# Patient Record
Sex: Female | Born: 1940 | Race: White | Hispanic: No | Marital: Married | Smoking: Never smoker
Health system: Southern US, Community
[De-identification: ages and names within clinical notes are randomized; demographics above are authoritative.]

## PROBLEM LIST (undated history)

## (undated) DIAGNOSIS — I10 Essential (primary) hypertension: Secondary | ICD-10-CM

## (undated) DIAGNOSIS — D649 Anemia, unspecified: Secondary | ICD-10-CM

## (undated) DIAGNOSIS — Z87442 Personal history of urinary calculi: Secondary | ICD-10-CM

## (undated) DIAGNOSIS — E039 Hypothyroidism, unspecified: Secondary | ICD-10-CM

## (undated) DIAGNOSIS — M069 Rheumatoid arthritis, unspecified: Secondary | ICD-10-CM

## (undated) DIAGNOSIS — Z86010 Personal history of colonic polyps: Secondary | ICD-10-CM

## (undated) DIAGNOSIS — E079 Disorder of thyroid, unspecified: Secondary | ICD-10-CM

## (undated) DIAGNOSIS — M1990 Unspecified osteoarthritis, unspecified site: Secondary | ICD-10-CM

## (undated) DIAGNOSIS — Z8639 Personal history of other endocrine, nutritional and metabolic disease: Secondary | ICD-10-CM

## (undated) DIAGNOSIS — K449 Diaphragmatic hernia without obstruction or gangrene: Secondary | ICD-10-CM

## (undated) DIAGNOSIS — N201 Calculus of ureter: Secondary | ICD-10-CM

## (undated) DIAGNOSIS — E041 Nontoxic single thyroid nodule: Secondary | ICD-10-CM

## (undated) DIAGNOSIS — M5137 Other intervertebral disc degeneration, lumbosacral region: Secondary | ICD-10-CM

## (undated) DIAGNOSIS — M51379 Other intervertebral disc degeneration, lumbosacral region without mention of lumbar back pain or lower extremity pain: Secondary | ICD-10-CM

## (undated) DIAGNOSIS — E785 Hyperlipidemia, unspecified: Secondary | ICD-10-CM

## (undated) DIAGNOSIS — N393 Stress incontinence (female) (male): Secondary | ICD-10-CM

## (undated) DIAGNOSIS — K219 Gastro-esophageal reflux disease without esophagitis: Secondary | ICD-10-CM

## (undated) DIAGNOSIS — Z860101 Personal history of adenomatous and serrated colon polyps: Secondary | ICD-10-CM

## (undated) DIAGNOSIS — Z973 Presence of spectacles and contact lenses: Secondary | ICD-10-CM

## (undated) HISTORY — DX: Other intervertebral disc degeneration, lumbosacral region: M51.37

## (undated) HISTORY — DX: Disorder of thyroid, unspecified: E07.9

## (undated) HISTORY — PX: WISDOM TOOTH EXTRACTION: SHX21

## (undated) HISTORY — DX: Personal history of colonic polyps: Z86.010

## (undated) HISTORY — DX: Gastro-esophageal reflux disease without esophagitis: K21.9

## (undated) HISTORY — DX: Unspecified osteoarthritis, unspecified site: M19.90

## (undated) HISTORY — DX: Rheumatoid arthritis, unspecified: M06.9

## (undated) HISTORY — DX: Essential (primary) hypertension: I10

---

## 1981-12-23 HISTORY — PX: OOPHORECTOMY: SHX86

## 1999-04-04 ENCOUNTER — Encounter: Payer: Self-pay | Admitting: Emergency Medicine

## 1999-04-04 ENCOUNTER — Emergency Department (HOSPITAL_COMMUNITY): Admission: EM | Admit: 1999-04-04 | Discharge: 1999-04-05 | Payer: Self-pay | Admitting: Emergency Medicine

## 1999-08-23 ENCOUNTER — Other Ambulatory Visit: Admission: RE | Admit: 1999-08-23 | Discharge: 1999-08-23 | Payer: Self-pay | Admitting: Obstetrics and Gynecology

## 2000-08-22 ENCOUNTER — Other Ambulatory Visit: Admission: RE | Admit: 2000-08-22 | Discharge: 2000-08-22 | Payer: Self-pay | Admitting: Obstetrics and Gynecology

## 2001-08-31 ENCOUNTER — Other Ambulatory Visit: Admission: RE | Admit: 2001-08-31 | Discharge: 2001-08-31 | Payer: Self-pay | Admitting: Obstetrics and Gynecology

## 2002-02-24 ENCOUNTER — Ambulatory Visit (HOSPITAL_BASED_OUTPATIENT_CLINIC_OR_DEPARTMENT_OTHER): Admission: RE | Admit: 2002-02-24 | Discharge: 2002-02-24 | Payer: Self-pay | Admitting: Plastic Surgery

## 2002-09-08 ENCOUNTER — Other Ambulatory Visit: Admission: RE | Admit: 2002-09-08 | Discharge: 2002-09-08 | Payer: Self-pay | Admitting: Obstetrics and Gynecology

## 2004-12-23 DIAGNOSIS — M069 Rheumatoid arthritis, unspecified: Secondary | ICD-10-CM | POA: Insufficient documentation

## 2005-10-03 ENCOUNTER — Ambulatory Visit (HOSPITAL_COMMUNITY): Admission: RE | Admit: 2005-10-03 | Discharge: 2005-10-03 | Payer: Self-pay | Admitting: Internal Medicine

## 2005-10-09 ENCOUNTER — Other Ambulatory Visit: Admission: RE | Admit: 2005-10-09 | Discharge: 2005-10-09 | Payer: Self-pay | Admitting: Interventional Radiology

## 2005-10-09 ENCOUNTER — Encounter (INDEPENDENT_AMBULATORY_CARE_PROVIDER_SITE_OTHER): Payer: Self-pay | Admitting: *Deleted

## 2005-10-09 ENCOUNTER — Encounter: Admission: RE | Admit: 2005-10-09 | Discharge: 2005-10-09 | Payer: Self-pay | Admitting: Internal Medicine

## 2006-09-29 ENCOUNTER — Ambulatory Visit: Payer: Self-pay | Admitting: Gastroenterology

## 2006-10-14 ENCOUNTER — Ambulatory Visit: Payer: Self-pay | Admitting: Gastroenterology

## 2006-10-14 ENCOUNTER — Encounter (INDEPENDENT_AMBULATORY_CARE_PROVIDER_SITE_OTHER): Payer: Self-pay | Admitting: *Deleted

## 2006-10-16 ENCOUNTER — Ambulatory Visit: Payer: Self-pay | Admitting: Gastroenterology

## 2006-10-27 ENCOUNTER — Ambulatory Visit: Payer: Self-pay | Admitting: Gastroenterology

## 2006-10-27 ENCOUNTER — Encounter (INDEPENDENT_AMBULATORY_CARE_PROVIDER_SITE_OTHER): Payer: Self-pay | Admitting: *Deleted

## 2007-12-24 HISTORY — PX: WRIST SURGERY: SHX841

## 2008-10-06 ENCOUNTER — Ambulatory Visit (HOSPITAL_BASED_OUTPATIENT_CLINIC_OR_DEPARTMENT_OTHER): Admission: RE | Admit: 2008-10-06 | Discharge: 2008-10-06 | Payer: Self-pay | Admitting: Orthopedic Surgery

## 2008-10-06 ENCOUNTER — Encounter (INDEPENDENT_AMBULATORY_CARE_PROVIDER_SITE_OTHER): Payer: Self-pay | Admitting: Orthopedic Surgery

## 2008-10-06 HISTORY — PX: DORSAL COMPARTMENT RELEASE: SHX1474

## 2009-03-30 ENCOUNTER — Emergency Department (HOSPITAL_COMMUNITY): Admission: EM | Admit: 2009-03-30 | Discharge: 2009-03-30 | Payer: Self-pay | Admitting: Emergency Medicine

## 2010-09-20 ENCOUNTER — Encounter: Admission: RE | Admit: 2010-09-20 | Discharge: 2010-09-20 | Payer: Self-pay | Admitting: Internal Medicine

## 2011-05-07 NOTE — Op Note (Signed)
[name], [name]                 ACCOUNT NO.:  [id number]   MEDICAL RECORD NO.:  [id number]          PATIENT TYPE:  AMB   LOCATION:  DSC                          FACILITY:  [facility]   PHYSICIAN:  Cindee Salt, M.D.       DATE OF BIRTH:  04/19/1941   DATE OF PROCEDURE:  10/06/2008  DATE OF DISCHARGE:                               OPERATIVE REPORT   PREOPERATIVE DIAGNOSIS:  [name], first dorsal compartment, right  wrist.   POSTOPERATIVE DIAGNOSIS:  [name], first dorsal compartment, right  wrist.   OPERATION:  Release of first dorsal compartment with tenosynovectomy,  right wrist.   SURGEON:  Cindee Salt, MD   ANESTHESIA:  Forearm-based IV regional.   ANESTHESIOLOGIST:  Zenon Mayo, MD.   HISTORY:  The patient is a 70 year old female with a history of  tenosynovitis of the first dorsal compartment and has de Quervain's with  a large cyst over the first dorsal compartment.  She is desirous of  release and having this cyst excised.  She is aware of risks and  complications including infection, recurrence injury to arteries,  nerves, tendons, incomplete relief of symptoms, dystrophy, problems with  radial nerve, and subluxation of the tendons.  She has elected to  undergo surgical decompression.  In the preoperative area, the patient  is seen.  The extremity was marked by both the patient and surgeon.  Antibiotic was given.   PROCEDURE:  The patient was brought to the operating room where a  forearm-based IV regional anesthetic was carried out without difficulty.  She was prepped using DuraPrep, supine position, right arm-free.  A time-  out was taken.  A longitudinal incision was made over the first dorsal  compartment.  The cyst which had been present was felt to have broken  after she hit it with a broom handle.  The dissection was carried down  through the subcutaneous tissue.  The radial nerve was identified and  protected.  The first dorsal compartment  was found be extremely  thickened.  This was incised on its most dorsal aspect releasing the  abductor pollicis longus and extensor brevis.  A very significant  tenosynovitis was present with adherence of all the tendons into one  large tendon group.  A tenosynovectomy was performed identifying the EPB  which was adherent to the APL.  This was released.  The tenosynovial  tissue was sent to pathology.  No further lesions were identified.  The  wound was irrigated.  The skin was then closed with interrupted 5-0  Vicryl Rapide sutures.  Sterile compressive dressing and thumb spica  splint  was applied.  The patient tolerated the procedure well and was taken to  the recovery room for observation in satisfactory condition.   She will be discharged home to return to the [facility]  in 1 week on Vicodin.           ______________________________  Cindee Salt, M.D.     GK/MEDQ  D:  10/06/2008  T:  10/06/2008  Job:  [id number]   cc:  Syed T. Lolly Mustache, M.D.

## 2011-10-31 ENCOUNTER — Encounter: Payer: Self-pay | Admitting: Gastroenterology

## 2011-11-04 ENCOUNTER — Encounter: Payer: Self-pay | Admitting: Gastroenterology

## 2011-12-12 ENCOUNTER — Encounter: Payer: Self-pay | Admitting: Gastroenterology

## 2011-12-27 ENCOUNTER — Encounter: Payer: Self-pay | Admitting: Gastroenterology

## 2011-12-27 ENCOUNTER — Ambulatory Visit (AMBULATORY_SURGERY_CENTER): Payer: Medicare Other | Admitting: *Deleted

## 2011-12-27 VITALS — Ht 65.0 in | Wt 198.5 lb

## 2011-12-27 DIAGNOSIS — Z1211 Encounter for screening for malignant neoplasm of colon: Secondary | ICD-10-CM

## 2011-12-27 DIAGNOSIS — Z86010 Personal history of colonic polyps: Secondary | ICD-10-CM

## 2011-12-27 MED ORDER — PEG-KCL-NACL-NASULF-NA ASC-C 100 G PO SOLR: ORAL | Status: DC

## 2012-01-10 ENCOUNTER — Encounter: Payer: Medicare Other | Admitting: Gastroenterology

## 2012-01-10 ENCOUNTER — Ambulatory Visit (AMBULATORY_SURGERY_CENTER): Payer: Medicare Other | Admitting: Gastroenterology

## 2012-01-10 ENCOUNTER — Encounter: Payer: Self-pay | Admitting: Gastroenterology

## 2012-01-10 DIAGNOSIS — Z1211 Encounter for screening for malignant neoplasm of colon: Secondary | ICD-10-CM

## 2012-01-10 DIAGNOSIS — D126 Benign neoplasm of colon, unspecified: Secondary | ICD-10-CM

## 2012-01-10 DIAGNOSIS — Z86010 Personal history of colonic polyps: Secondary | ICD-10-CM

## 2012-01-10 MED ORDER — SODIUM CHLORIDE 0.9 % IV SOLN: 500.0000 mL | INTRAVENOUS | Status: DC

## 2012-01-10 NOTE — Op Note (Signed)
[facility] [address] [place], [place]  [address]  COLONOSCOPY PROCEDURE REPORT  PATIENT:  Allison, Whitaker  MR#:  [id number] BIRTHDATE:  1941-09-04, 70 yrs. old  GENDER:  female ENDOSCOPIST:  Judie Petit T. Russella Dar, MD, [facility]  PROCEDURE DATE:  01/10/2012 PROCEDURE:  Colonoscopy with snare polypectomy ASA CLASS:  Class II INDICATIONS:  1) surveillance and high-risk screening  2) history of pre-cancerous (adenomatous) colon polyps: 09/2006. MEDICATIONS:   MAC sedation, administered by CRNA, propofol (Diprivan) 320 mg IV DESCRIPTION OF PROCEDURE:   After the risks benefits and alternatives of the procedure were thoroughly explained, informed consent was obtained.  Digital rectal exam was performed and revealed no abnormalities.   The LB PCF-H180AL [id number] endoscope was introduced through the anus and advanced to the cecum, which was identified by both the appendix and ileocecal valve, without limitations.  The quality of the prep was excellent, using MoviPrep.  The instrument was then slowly withdrawn as the colon was fully examined. <<PROCEDUREIMAGES>> FINDINGS:  A sessile polyp was found in the mid transverse colon. It was 5 mm in size. Polyp was snared without cautery. Retrieval was successful. Otherwise normal colonoscopy without other polyps, masses, vascular ectasias, or inflammatory changes. Retroflexed views in the rectum revealed no abnormalities. The time to cecum = 2  minutes. The scope was then withdrawn (time =  9.75  min) from the patient and the procedure completed.  COMPLICATIONS:  None  ENDOSCOPIC IMPRESSION: 1) 5 mm sessile polyp in the mid transverse colon  RECOMMENDATIONS: 1) Await pathology results 2) Repeat Colonoscopy in 5 years.  Venita Lick. Russella Dar, MD, [facility]  n. eSIGNED:   [name] at 01/10/2012 10:40 AM  [place], [id number]

## 2012-01-10 NOTE — Patient Instructions (Signed)
Please refer to your blue and neon green sheets for instructions regarding diet and activity for the rest of today.  You may resume your medications as you would normally take them.   You will receive a letter in the mail in about two weeks regarding the results of the biopsies.   Colon Polyps A polyp is extra tissue that grows inside your body. Colon polyps grow in the large intestine. The large intestine, also called the colon, is part of your digestive system. It is a long, hollow tube at the end of your digestive tract where your body makes and stores stool. Most polyps are not dangerous. They are benign. This means they are not cancerous. But over time, some types of polyps can turn into cancer. Polyps that are smaller than a pea are usually not harmful. But larger polyps could someday become or may already be cancerous. To be safe, doctors remove all polyps and test them.  WHO GETS POLYPS? Anyone can get polyps, but certain people are more likely than others. You may have a greater chance of getting polyps if:  You are over 50.   You have had polyps before.   Someone in your family has had polyps.   Someone in your family has had cancer of the large intestine.   Find out if someone in your family has had polyps. You may also be more likely to get polyps if you:   Eat a lot of fatty foods.   Smoke.   Drink alcohol.   Do not exercise.   Eat too much.  SYMPTOMS  Most small polyps do not cause symptoms. People often do not know they have one until their caregiver finds it during a regular checkup or while testing them for something else. Some people do have symptoms like these:  Bleeding from the anus. You might notice blood on your underwear or on toilet paper after you have had a bowel movement.   Constipation or diarrhea that lasts more than a week.   Blood in the stool. Blood can make stool look black or it can show up as red streaks in the stool.  If you have any of  these symptoms, see your caregiver. HOW DOES THE DOCTOR TEST FOR POLYPS? The doctor can use four tests to check for polyps:  Digital rectal exam. The caregiver wears gloves and checks your rectum (the last part of the large intestine) to see if it feels normal. This test would find polyps only in the rectum. Your caregiver may need to do one of the other tests listed below to find polyps higher up in the intestine.   Barium enema. The caregiver puts a liquid called barium into your rectum before taking x-rays of your large intestine. Barium makes your intestine look white in the pictures. Polyps are dark, so they are easy to see.   Sigmoidoscopy. With this test, the caregiver can see inside your large intestine. A thin flexible tube is placed into your rectum. The device is called a sigmoidoscope, which has a light and a tiny video camera in it. The caregiver uses the sigmoidoscope to look at the last third of your large intestine.   Colonoscopy. This test is like sigmoidoscopy, but the caregiver looks at all of the large intestine. It usually requires sedation. This is the most common method for finding and removing polyps.  TREATMENT   The caregiver will remove the polyp during sigmoidoscopy or colonoscopy. The polyp is then tested for  cancer.   If you have had polyps, your caregiver may want you to get tested regularly in the future.  PREVENTION  There is not one sure way to prevent polyps. You might be able to lower your risk of getting them if you:  Eat more fruits and vegetables and less fatty food.   Do not smoke.   Avoid alcohol.   Exercise every day.   Lose weight if you are overweight.   Eating more calcium and folate can also lower your risk of getting polyps. Some foods that are rich in calcium are milk, cheese, and broccoli. Some foods that are rich in folate are chickpeas, kidney beans, and spinach.   Aspirin might help prevent polyps. Studies are under way.  Document  Released: 09/04/2004 Document Revised: 08/21/2011 Document Reviewed: 02/10/2008 [facility] Patient Information 2012 [place], [place].

## 2012-01-10 NOTE — Progress Notes (Signed)
Patient did not experience any of the following events: a burn prior to discharge; a fall within the facility; wrong site/side/patient/procedure/implant event; or a hospital transfer or hospital admission upon discharge from the facility. (G8907) Patient did not have preoperative order for IV antibiotic SSI prophylaxis. (G8918)  

## 2012-01-13 ENCOUNTER — Telehealth: Payer: Self-pay | Admitting: *Deleted

## 2012-01-13 NOTE — Telephone Encounter (Signed)

## 2012-01-14 ENCOUNTER — Encounter: Payer: Self-pay | Admitting: Gastroenterology

## 2012-11-02 ENCOUNTER — Other Ambulatory Visit: Payer: Self-pay | Admitting: Internal Medicine

## 2012-11-02 DIAGNOSIS — E041 Nontoxic single thyroid nodule: Secondary | ICD-10-CM

## 2012-11-06 ENCOUNTER — Ambulatory Visit
Admission: RE | Admit: 2012-11-06 | Discharge: 2012-11-06 | Disposition: A | Discharge status: Home / Self Care | Payer: Medicare Other | Source: Ambulatory Visit | Attending: Internal Medicine | Admitting: Internal Medicine

## 2012-11-06 DIAGNOSIS — E041 Nontoxic single thyroid nodule: Secondary | ICD-10-CM

## 2012-11-16 ENCOUNTER — Other Ambulatory Visit: Payer: Self-pay | Admitting: Internal Medicine

## 2012-11-16 DIAGNOSIS — E041 Nontoxic single thyroid nodule: Secondary | ICD-10-CM

## 2012-11-26 ENCOUNTER — Other Ambulatory Visit (HOSPITAL_COMMUNITY)
Admission: RE | Admit: 2012-11-26 | Discharge: 2012-11-26 | Disposition: A | Discharge status: Home / Self Care | Payer: Medicare Other | Source: Ambulatory Visit | Attending: Interventional Radiology | Admitting: Interventional Radiology

## 2012-11-26 ENCOUNTER — Ambulatory Visit
Admission: RE | Admit: 2012-11-26 | Discharge: 2012-11-26 | Disposition: A | Discharge status: Home / Self Care | Payer: Medicare Other | Source: Ambulatory Visit | Attending: Internal Medicine | Admitting: Internal Medicine

## 2012-11-26 DIAGNOSIS — E041 Nontoxic single thyroid nodule: Secondary | ICD-10-CM

## 2012-11-26 DIAGNOSIS — E049 Nontoxic goiter, unspecified: Secondary | ICD-10-CM | POA: Insufficient documentation

## 2013-10-04 ENCOUNTER — Other Ambulatory Visit: Payer: Self-pay | Admitting: Dermatology

## 2013-10-28 ENCOUNTER — Other Ambulatory Visit: Payer: Self-pay

## 2014-10-03 ENCOUNTER — Other Ambulatory Visit: Payer: Self-pay | Admitting: Dermatology

## 2015-07-04 ENCOUNTER — Encounter: Payer: Self-pay | Admitting: Gastroenterology

## 2016-08-29 ENCOUNTER — Encounter (HOSPITAL_COMMUNITY): Payer: Self-pay | Admitting: *Deleted

## 2016-08-29 ENCOUNTER — Emergency Department (HOSPITAL_COMMUNITY)
Admission: EM | Admit: 2016-08-29 | Discharge: 2016-08-29 | Disposition: A | Discharge status: Home / Self Care | Payer: Medicare Other | Attending: Emergency Medicine | Admitting: Emergency Medicine

## 2016-08-29 DIAGNOSIS — R109 Unspecified abdominal pain: Secondary | ICD-10-CM | POA: Insufficient documentation

## 2016-08-29 DIAGNOSIS — I10 Essential (primary) hypertension: Secondary | ICD-10-CM | POA: Insufficient documentation

## 2016-08-29 DIAGNOSIS — Z79899 Other long term (current) drug therapy: Secondary | ICD-10-CM | POA: Diagnosis not present

## 2016-08-29 DIAGNOSIS — Z791 Long term (current) use of non-steroidal anti-inflammatories (NSAID): Secondary | ICD-10-CM | POA: Diagnosis not present

## 2016-08-29 DIAGNOSIS — R110 Nausea: Secondary | ICD-10-CM | POA: Insufficient documentation

## 2016-08-29 LAB — BASIC METABOLIC PANEL
Anion gap: 7 (ref 5–15)
BUN: 19 mg/dL (ref 6–20)
CO2: 23 mmol/L (ref 22–32)
Calcium: 9.7 mg/dL (ref 8.9–10.3)
Chloride: 107 mmol/L (ref 101–111)
Creatinine, Ser: 0.8 mg/dL (ref 0.44–1.00)
GFR calc Af Amer: >60 mL/min (ref >60)
GFR calc non Af Amer: >60 mL/min (ref >60)
Glucose, Bld: 132 mg/dL — ABNORMAL HIGH (ref 65–99)
Potassium: 4.1 mmol/L (ref 3.5–5.1)
Sodium: 137 mmol/L (ref 135–145)

## 2016-08-29 LAB — CBC WITH DIFFERENTIAL/PLATELET
Basophils Absolute: 0 10*3/uL (ref 0.0–0.1)
Basophils Relative: 0 %
Eosinophils Absolute: 0 10*3/uL (ref 0.0–0.7)
Eosinophils Relative: 0 %
HCT: 39.7 % (ref 36.0–46.0)
Hemoglobin: 13.5 g/dL (ref 12.0–15.0)
Lymphocytes Relative: 5 %
Lymphs Abs: 0.5 10*3/uL — ABNORMAL LOW (ref 0.7–4.0)
MCH: 31.5 pg (ref 26.0–34.0)
MCHC: 34 g/dL (ref 30.0–36.0)
MCV: 92.5 fL (ref 78.0–100.0)
Monocytes Absolute: 0.2 10*3/uL (ref 0.1–1.0)
Monocytes Relative: 3 %
Neutro Abs: 8.1 10*3/uL — ABNORMAL HIGH (ref 1.7–7.7)
Neutrophils Relative %: 92 %
Platelets: 221 10*3/uL (ref 150–400)
RBC: 4.29 MIL/uL (ref 3.87–5.11)
RDW: 13.9 % (ref 11.5–15.5)
WBC: 8.8 10*3/uL (ref 4.0–10.5)

## 2016-08-29 LAB — URINALYSIS, ROUTINE W REFLEX MICROSCOPIC
Bilirubin Urine: NEGATIVE
Glucose, UA: NEGATIVE mg/dL
Ketones, ur: NEGATIVE mg/dL
Nitrite: NEGATIVE
Protein, ur: NEGATIVE mg/dL
Specific Gravity, Urine: 1.024 (ref 1.005–1.030)
pH: 6 (ref 5.0–8.0)

## 2016-08-29 LAB — URINE MICROSCOPIC-ADD ON

## 2016-08-29 MED ORDER — ONDANSETRON HCL 4 MG PO TABS: 4.0000 mg | ORAL_TABLET | Freq: Four times a day (QID) | ORAL | 0 refills | Status: DC

## 2016-08-29 MED ORDER — OXYCODONE-ACETAMINOPHEN 5-325 MG PO TABS
2.0000 | ORAL_TABLET | Freq: Once | ORAL | Status: AC
  Administered 2016-08-29: 2 via ORAL
  Filled 2016-08-29: qty 2

## 2016-08-29 MED ORDER — ONDANSETRON 4 MG PO TBDP
4.0000 mg | ORAL_TABLET | Freq: Once | ORAL | Status: AC
  Administered 2016-08-29: 4 mg via ORAL
  Filled 2016-08-29: qty 1

## 2016-08-29 MED ORDER — IBUPROFEN 200 MG PO TABS
600.0000 mg | ORAL_TABLET | Freq: Once | ORAL | Status: AC
  Administered 2016-08-29: 600 mg via ORAL
  Filled 2016-08-29: qty 3

## 2016-08-29 MED ORDER — OXYCODONE-ACETAMINOPHEN 5-325 MG PO TABS: 1.0000 | ORAL_TABLET | ORAL | 0 refills | Status: DC | PRN

## 2016-08-29 NOTE — ED Provider Notes (Signed)
[facility] DEPT Provider Note   CSN: [id number] Arrival date & time: 08/29/16  0818  By signing my name below, I, Sonum Patel, attest that this documentation has been prepared under the direction and in the presence of Virgel Manifold, MD. Electronically Signed: Sonum Patel, Education administrator. 08/29/16. 9:32 AM.  History   Chief Complaint Chief Complaint  Patient presents with  . Flank Pain    The history is provided by the patient. No language interpreter was used.     HPI Comments: Allison Whitaker is a 75 y.o. female who presents to the Emergency Department complaining of intermittent right flank pain with associated nausea that began 7.5 hours ago. She reports a history of similar symptoms with prior kidney stones. She has not taken any OTC medication for her pain. She denies history of requiring lithotripsy. She denies dysuria, hematuria, fever, chills.    Past Medical History:  Diagnosis Date  . GERD (gastroesophageal reflux disease)   . Hypertension   . Rheumatoid arthritis(714.0)   . Thyroid disease     There are no active problems to display for this patient.   Past Surgical History:  Procedure Laterality Date  . OOPHORECTOMY  1983  . WRIST SURGERY  2009   for tendonitis    OB History    No data available       Home Medications    Prior to Admission medications   Medication Sig Start Date End Date Taking? Authorizing Provider  cholecalciferol (VITAMIN D) 1000 units tablet Take 2,000 Units by mouth daily.   Yes Historical Provider, MD  folic acid (FOLVITE) 1 MG tablet Take 3 mg by mouth daily.    Yes Historical Provider, MD  levothyroxine (SYNTHROID, LEVOTHROID) 75 MCG tablet Take 75 mcg by mouth daily.     Yes Historical Provider, MD  losartan-hydrochlorothiazide (HYZAAR) 50-12.5 MG per tablet Take 1 tablet by mouth daily.     Yes Historical Provider, MD  meloxicam (MOBIC) 7.5 MG tablet Take 7.5 mg by mouth daily as needed for pain.  07/30/16  Yes Historical Provider,  MD  methotrexate (RHEUMATREX) 2.5 MG tablet Take 20 mg by mouth every Tuesday. Caution:Chemotherapy. Protect from light. Takes 8  tabs once per week.   Yes Historical Provider, MD  Multiple Vitamin (MULTIVITAMIN) tablet Take 1 tablet by mouth daily.     Yes Historical Provider, MD  omeprazole (PRILOSEC) 20 MG capsule Take 20 mg by mouth daily.     Yes Historical Provider, MD  oxybutynin (DITROPAN-XL) 10 MG 24 hr tablet Take 10 mg by mouth every other day. 07/23/16  Yes Historical Provider, MD    Family History Family History  Problem Relation Age of Onset  . Stroke Mother   . Colon cancer Neg Hx     Social History Social History  Substance Use Topics  . Smoking status: Never Smoker  . Smokeless tobacco: Never Used  . Alcohol use 0.6 oz/week    1 Glasses of wine per week     Allergies   Review of patient's allergies indicates no known allergies.   Review of Systems Review of Systems  Constitutional: Negative for chills and fever.  Gastrointestinal: Positive for nausea.  Genitourinary: Positive for flank pain. Negative for dysuria and hematuria.  All other systems reviewed and are negative.    Physical Exam Updated Vital Signs BP 146/65 (BP Location: Right Arm)   Pulse (!) 58   Temp 98.3 F (36.8 C) (Oral)   Resp 18   Ht 5'  5" (1.651 m)   Wt 190 lb (86.2 kg)   SpO2 98%   BMI 31.62 kg/m   Physical Exam  Constitutional: She is oriented to person, place, and time. She appears well-developed and well-nourished. No distress.  HENT:  Head: Normocephalic and atraumatic.  Eyes: EOM are normal.  Neck: Normal range of motion.  Cardiovascular: Normal rate, regular rhythm and normal heart sounds.   Pulmonary/Chest: Effort normal and breath sounds normal.  Abdominal: Soft. She exhibits no distension. There is no tenderness.  Musculoskeletal: Normal range of motion. She exhibits tenderness.  Right CVA tenderness.   Neurological: She is alert and oriented to person, place,  and time.  Skin: Skin is warm and dry.  Psychiatric: She has a normal mood and affect. Judgment normal.  Nursing note and vitals reviewed.    ED Treatments / Results  DIAGNOSTIC STUDIES: Oxygen Saturation is 98% on RA, normal by my interpretation.    COORDINATION OF CARE: 9:32 AM Discussed treatment plan with pt at bedside and pt agreed to plan.    Labs (all labs ordered are listed, but only abnormal results are displayed) Labs Reviewed  URINALYSIS, ROUTINE W REFLEX MICROSCOPIC (NOT AT [place]) - Abnormal; Notable for the following:       Result Value   APPearance CLOUDY (*)    Hgb urine dipstick LARGE (*)    Leukocytes, UA SMALL (*)    All other components within normal limits  URINE MICROSCOPIC-ADD ON - Abnormal; Notable for the following:    Squamous Epithelial / LPF 0-5 (*)    Bacteria, UA FEW (*)    All other components within normal limits  BASIC METABOLIC PANEL - Abnormal; Notable for the following:    Glucose, Bld 132 (*)    All other components within normal limits  CBC WITH DIFFERENTIAL/PLATELET - Abnormal; Notable for the following:    Neutro Abs 8.1 (*)    Lymphs Abs 0.5 (*)    All other components within normal limits    EKG  EKG Interpretation None       Radiology No results found.  Procedures Procedures (including critical care time)  Medications Ordered in ED Medications - No data to display   Initial Impression / Assessment and Plan / ED Course  I have reviewed the triage vital signs and the nursing notes.  Pertinent labs & imaging results that were available during my care of the patient were reviewed by me and considered in my medical decision making (see chart for details).  Clinical Course    75yF with flank pain. Symptoms consistent with ureteral colic. Hx of the same. Blood noted on UA. Symptoms now significantly improved. Discussed imaging versus expectant management. Pt and I are both comfortable with watchful waiting. Return  precautions discussed. outpt FU otherwise.   Final Clinical Impressions(s) / ED Diagnoses   Final diagnoses:  Flank pain    New Prescriptions New Prescriptions   No medications on file    I personally preformed the services scribed in my presence. The recorded information has been reviewed is accurate. Virgel Manifold, MD.    Virgel Manifold, MD 09/04/16 [phone]

## 2016-08-29 NOTE — ED Triage Notes (Signed)
Pt reports R flank pain that started at 0200 today.  Hx of kidney stone in same side in the past.  Pt reports nausea as well.  Denies dysuria or hematuria but states she has pressure in her rectum feeling like she has to go.

## 2016-11-12 ENCOUNTER — Encounter: Payer: Self-pay | Admitting: Gastroenterology

## 2016-11-25 ENCOUNTER — Encounter: Payer: Self-pay | Admitting: Gastroenterology

## 2017-01-20 ENCOUNTER — Encounter: Payer: Self-pay | Admitting: Gastroenterology

## 2017-01-20 ENCOUNTER — Ambulatory Visit (AMBULATORY_SURGERY_CENTER): Payer: Self-pay | Admitting: *Deleted

## 2017-01-20 VITALS — Ht 65.0 in | Wt 195.0 lb

## 2017-01-20 DIAGNOSIS — Z86010 Personal history of colonic polyps: Secondary | ICD-10-CM

## 2017-01-20 MED ORDER — NA SULFATE-K SULFATE-MG SULF 17.5-3.13-1.6 GM/177ML PO SOLN: ORAL | 0 refills | Status: DC

## 2017-01-20 NOTE — Progress Notes (Signed)
Patient denies any allergies to eggs or soy. Patient denies any problems with anesthesia/sedation. Patient denies any oxygen use at home and does not take any diet/weight loss medications. EMMI education declined by patient.  

## 2017-02-03 ENCOUNTER — Ambulatory Visit (AMBULATORY_SURGERY_CENTER): Payer: Medicare Other | Admitting: Gastroenterology

## 2017-02-03 ENCOUNTER — Encounter: Payer: Self-pay | Admitting: Gastroenterology

## 2017-02-03 VITALS — BP 140/80 | HR 54 | Temp 98.4°F | Resp 18 | Wt 190.0 lb

## 2017-02-03 DIAGNOSIS — D124 Benign neoplasm of descending colon: Secondary | ICD-10-CM | POA: Diagnosis not present

## 2017-02-03 DIAGNOSIS — D175 Benign lipomatous neoplasm of intra-abdominal organs: Secondary | ICD-10-CM

## 2017-02-03 DIAGNOSIS — Z86010 Personal history of colonic polyps: Secondary | ICD-10-CM

## 2017-02-03 HISTORY — PX: COLONOSCOPY WITH PROPOFOL: SHX5780

## 2017-02-03 MED ORDER — SODIUM CHLORIDE 0.9 % IV SOLN: 500.0000 mL | INTRAVENOUS | Status: DC

## 2017-02-03 NOTE — Patient Instructions (Signed)
YOU HAD AN ENDOSCOPIC PROCEDURE TODAY AT [facility]:   Refer to the procedure report that was given to you for any specific questions about what was found during the examination.  If the procedure report does not answer your questions, please call your gastroenterologist to clarify.  If you requested that your care partner not be given the details of your procedure findings, then the procedure report has been included in a sealed envelope for you to review at your convenience later.  YOU SHOULD EXPECT: Some feelings of bloating in the abdomen. Passage of more gas than usual.  Walking can help get rid of the air that was put into your GI tract during the procedure and reduce the bloating. If you had a lower endoscopy (such as a colonoscopy or flexible sigmoidoscopy) you may notice spotting of blood in your stool or on the toilet paper. If you underwent a bowel prep for your procedure, you may not have a normal bowel movement for a few days.  Please Note:  You might notice some irritation and congestion in your nose or some drainage.  This is from the oxygen used during your procedure.  There is no need for concern and it should clear up in a day or so.  SYMPTOMS TO REPORT IMMEDIATELY:   Following lower endoscopy (colonoscopy or flexible sigmoidoscopy):  Excessive amounts of blood in the stool  Significant tenderness or worsening of abdominal pains  Swelling of the abdomen that is new, acute  Fever of 100F or higher  For urgent or emergent issues, a gastroenterologist can be reached at any hour by calling [phone].   DIET:  We do recommend a small meal at first, but then you may proceed to your regular diet.  Drink plenty of fluids but you should avoid alcoholic beverages for 24 hours. We recommend a high fiber diet. Please seen hand-out given to you at discharge.  ACTIVITY:  You should plan to take it easy for the rest of today and you should NOT DRIVE or use heavy  machinery until tomorrow (because of the sedation medicines used during the test).    FOLLOW UP: Our staff will call the number listed on your records the next business day following your procedure to check on you and address any questions or concerns that you may have regarding the information given to you following your procedure. If we do not reach you, we will leave a message.  However, if you are feeling well and you are not experiencing any problems, there is no need to return our call.  We will assume that you have returned to your regular daily activities without incident.  If any biopsies were taken you will be contacted by phone or by letter within the next 1-3 weeks.  Please call us at [phone] if you have not heard about the biopsies in 3 weeks.   Thank you for allowing [place] to provide for your healthcare needs today.  SIGNATURES/CONFIDENTIALITY: You and/or your care partner have signed paperwork which will be entered into your electronic medical record.  These signatures attest to the fact that that the information above on your After Visit Summary has been reviewed and is understood.  Full responsibility of the confidentiality of this discharge information lies with you and/or your care-partner.

## 2017-02-03 NOTE — Progress Notes (Signed)
Called to room to assist during endoscopic procedure.  Patient ID and intended procedure confirmed with present staff. Received instructions for my participation in the procedure from the performing physician.  

## 2017-02-03 NOTE — Op Note (Signed)
[facility] Patient Name: Allison Whitaker Procedure Date: 02/03/2017 10:18 AM MRN: [id number] Endoscopist: Ladene Artist , MD Age: 76 Referring MD:  Date of Birth: [birth year] Gender: Female Account #: [id number] Procedure:                Colonoscopy Indications:              Surveillance: Personal history of adenomatous                            polyps on last colonoscopy > 5 years ago Medicines:                Monitored Anesthesia Care Procedure:                Pre-Anesthesia Assessment:                           - Prior to the procedure, a History and Physical                            was performed, and patient medications and                            allergies were reviewed. The patient's tolerance of                            previous anesthesia was also reviewed. The risks                            and benefits of the procedure and the sedation                            options and risks were discussed with the patient.                            All questions were answered, and informed consent                            was obtained. Prior Anticoagulants: The patient has                            taken no previous anticoagulant or antiplatelet                            agents. ASA Grade Assessment: II - A patient with                            mild systemic disease. After reviewing the risks                            and benefits, the patient was deemed in                            satisfactory condition to undergo the procedure.  After obtaining informed consent, the colonoscope                            was passed under direct vision. Throughout the                            procedure, the patient's blood pressure, pulse, and                            oxygen saturations were monitored continuously. The                            Model PCF-H190L [id number]) scope was introduced                            through the anus and  advanced to the the cecum,                            identified by appendiceal orifice and ileocecal                            valve. The ileocecal valve, appendiceal orifice,                            and rectum were photographed. The quality of the                            bowel preparation was excellent. The colonoscopy                            was performed without difficulty. The patient                            tolerated the procedure well. Scope In: 10:23:01 AM Scope Out: 10:34:56 AM Scope Withdrawal Time: 0 hours 9 minutes 41 seconds  Total Procedure Duration: 0 hours 11 minutes 55 seconds  Findings:                 The perianal and digital rectal examinations were                            normal.                           A 6 mm polyp was found in the descending colon. The                            polyp was sessile. The polyp was removed with a                            cold snare. Resection and retrieval were complete.                           Internal hemorrhoids were found during  retroflexion. The hemorrhoids were small and Grade                            I (internal hemorrhoids that do not prolapse).                           A single small localized angiodysplastic lesion                            without bleeding was found in the transverse colon.                           There was a medium-sized lipoma, 15 mm in diameter,                            in the ascending colon. Biopsies were taken with a                            cold forceps for histology.                           A few medium-mouthed diverticula were found in the                            sigmoid colon. There was no evidence of                            diverticular bleeding.                           The exam was otherwise without abnormality on                            direct and retroflexion views. Complications:            No immediate complications.  Estimated blood loss:                            None. Estimated Blood Loss:     Estimated blood loss: none. Impression:               - One 6 mm polyp in the descending colon, removed                            with a cold snare. Resected and retrieved.                           - Internal hemorrhoids.                           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                           -  Medium-sized lipoma in the ascending colon.                            Biopsied.                           - Mild diverticulosis in the sigmoid colon. There                            was no evidence of diverticular bleeding.                           - The examination was otherwise normal on direct                            and retroflexion views. Recommendation:           - Repeat colonoscopy in 5 years for surveillance.                           - Patient has a contact number available for                            emergencies. The signs and symptoms of potential                            delayed complications were discussed with the                            patient. Return to normal activities tomorrow.                            Written discharge instructions were provided to the                            patient.                           - High fiber diet.                           - Continue present medications.                           - Await pathology results. Ladene Artist, MD 02/03/2017 10:40:31 AM This report has been signed electronically.

## 2017-02-03 NOTE — Progress Notes (Signed)
Report to PACU, RN, vss, BBS= Clear.  

## 2017-02-04 ENCOUNTER — Telehealth: Payer: Self-pay

## 2017-02-04 NOTE — Telephone Encounter (Signed)
  Follow up Call-  Call back number 02/03/2017  Post procedure Call Back phone  # [phone]  Permission to leave phone message Yes  Some recent data might be hidden     Patient questions:  Do you have a fever, pain , or abdominal swelling? No. Pain Score  0 *  Have you tolerated food without any problems? Yes.    Have you been able to return to your normal activities? Yes.    Do you have any questions about your discharge instructions: Diet   No. Medications  No. Follow up visit  No.  Do you have questions or concerns about your Care? No.  Actions: * If pain score is 4 or above: No action needed, pain <4.

## 2017-02-11 ENCOUNTER — Encounter: Payer: Self-pay | Admitting: Gastroenterology

## 2017-08-19 ENCOUNTER — Ambulatory Visit (INDEPENDENT_AMBULATORY_CARE_PROVIDER_SITE_OTHER): Payer: Medicare Other | Admitting: Orthopedic Surgery

## 2017-08-19 ENCOUNTER — Ambulatory Visit (INDEPENDENT_AMBULATORY_CARE_PROVIDER_SITE_OTHER): Payer: Medicare Other

## 2017-08-19 DIAGNOSIS — M1611 Unilateral primary osteoarthritis, right hip: Secondary | ICD-10-CM

## 2017-08-19 NOTE — Progress Notes (Signed)
   Office Visit Note   Patient: Allison Whitaker           Date of Birth: 06-08-1941           MRN: [id number] Visit Date: 08/19/2017              Requested by: Burnard Bunting, [address] [place], [place] [address] PCP: Burnard Bunting, MD  Chief Complaint  Patient presents with  . Right Hip - Pain      HPI: Patient is a 76 year old woman who has had a long-standing history of right hip pain. She states its painful when she lays on the right hip painful with walking for about a month. She states she has tried hot water and anti-inflammatories without relief.  Assessment & Plan: Visit Diagnoses:  1. Unilateral primary osteoarthritis, right hip     Plan: Will have patient evaluated with Dr. Ernestina Patches for an injection for the right hip. Do not feel that patient would require a total hip arthroplasty at this time anticipate more aggressive treatment than nonsteroidals may provide her with sufficient relief.  Follow-Up Instructions: Return if symptoms worsen or fail to improve.   Ortho Exam  Patient is alert, oriented, no adenopathy, well-dressed, normal affect, normal respiratory effort. Examination patient has an abductor lurch and swings her body weight over the right side. She has approximately one half the range of motion of right hip compared to the left hip and her buttocks pain and groin pain is reproduced with external rotation. She has internal rotation of approximately 20 external rotation of 20 on the right. She has a negative straight leg raise no focal weakness.  Imaging: Xr Hip Unilat W Or W/o Pelvis 2-3 Views Right  Result Date: 08/19/2017 2 view radiographs of the right hip shows subcondylar sclerosis of the acetabulumsignificant joint space narrowing.  No images are attached to the encounter.  Labs: No results found for: HGBA1C, ESRSEDRATE, CRP, LABURIC, REPTSTATUS, GRAMSTAIN, CULT, LABORGA  Orders:  Orders Placed This Encounter  Procedures  . XR HIP  UNILAT W OR W/O PELVIS 2-3 VIEWS RIGHT  . Ambulatory referral to Physical Medicine Rehab   No orders of the defined types were placed in this encounter.    Procedures: No procedures performed  Clinical Data: No additional findings.  ROS:  All other systems negative, except as noted in the HPI. Review of Systems  Objective: Vital Signs: There were no vitals taken for this visit.  Specialty Comments:  No specialty comments available.  PMFS History: There are no active problems to display for this patient.  Past Medical History:  Diagnosis Date  . Arthritis    Rheumatoid   . GERD (gastroesophageal reflux disease)   . Hypertension   . Rheumatoid arthritis(714.0)   . Thyroid disease     Family History  Problem Relation Age of Onset  . Stroke Mother   . Colon cancer Neg Hx     Past Surgical History:  Procedure Laterality Date  . OOPHORECTOMY  1983  . WRIST SURGERY  2009   for tendonitis   Social History   Occupational History  . Not on file.   Social History Main Topics  . Smoking status: Never Smoker  . Smokeless tobacco: Never Used  . Alcohol use 1.2 oz/week    2 Glasses of wine per week  . Drug use: No  . Sexual activity: Not on file

## 2017-08-29 ENCOUNTER — Ambulatory Visit (INDEPENDENT_AMBULATORY_CARE_PROVIDER_SITE_OTHER): Payer: Medicare Other

## 2017-08-29 ENCOUNTER — Encounter (INDEPENDENT_AMBULATORY_CARE_PROVIDER_SITE_OTHER): Payer: Self-pay | Admitting: Physical Medicine and Rehabilitation

## 2017-08-29 ENCOUNTER — Ambulatory Visit (INDEPENDENT_AMBULATORY_CARE_PROVIDER_SITE_OTHER): Payer: Medicare Other | Admitting: Physical Medicine and Rehabilitation

## 2017-08-29 DIAGNOSIS — M25551 Pain in right hip: Secondary | ICD-10-CM | POA: Diagnosis not present

## 2017-08-29 NOTE — Progress Notes (Signed)
   Allison Whitaker - 76 y.o. female MRN [id number]  Date of birth: [birth year]  Office Visit Note: Visit Date: 08/29/2017 PCP: Burnard Bunting, MD Referred by: Burnard Bunting, MD  Subjective: Chief Complaint  Patient presents with  . Right Hip - Pain   HPI: Allison Whitaker is a 76 year old female with chronic right hip pain he was recently seen by Dr. Sharol Given. He requests a diagnostic and therapeutic anesthetic hip arthrogram.    ROS Otherwise per HPI.  Assessment & Plan: Visit Diagnoses:  1. Pain in right hip     Plan: Findings:  Diagnostic and therapeutic anesthetic hip arthrogram on the right. Patient did have relief can anesthetic phase.    Meds & Orders: No orders of the defined types were placed in this encounter.   Orders Placed This Encounter  Procedures  . Large Joint Injection/Arthrocentesis  . XR C-ARM NO REPORT    Follow-up: Return if symptoms worsen or fail to improve, for Dr. Sharol Given.   Procedures: Diagnostic and therapeutic anesthetic hip arthrogram Date/Time: 08/29/2017 9:21 AM Performed by: Magnus Sinning Authorized by: Magnus Sinning   Consent Given by:  Patient Site marked: the procedure site was marked   Timeout: prior to procedure the correct patient, procedure, and site was verified   Indications:  Pain and diagnostic evaluation Location:  Hip Site:  R hip joint Prep: patient was prepped and draped in usual sterile fashion   Needle Size:  22 G Approach:  Anterior Ultrasound Guidance: No   Fluoroscopic Guidance: No   Arthrogram: Yes   Medications:  3 mL bupivacaine 0.5 %; 80 mg triamcinolone acetonide 40 MG/ML Aspiration Attempted: Yes   Patient tolerance:  Patient tolerated the procedure well with no immediate complications  Arthrogram demonstrated excellent flow of contrast throughout the joint surface without extravasation or obvious defect.  The patient had relief of symptoms during the anesthetic phase of the injection.      No notes on  file   Clinical History: No specialty comments available.  She reports that she has never smoked. She has never used smokeless tobacco. No results for input(s): HGBA1C, LABURIC in the last 8760 hours.  Objective:  VS:  HT:    WT:   BMI:     BP:   HR: bpm  TEMP: ( )  RESP:  Physical Exam  Musculoskeletal:  Patient did have hip pain with end ranges of internal rotation and external rotation of the right hip. She has good distal strength.    Ortho Exam Imaging: No results found.  Past Medical/Family/Surgical/Social History: Medications & Allergies reviewed per EMR There are no active problems to display for this patient.  Past Medical History:  Diagnosis Date  . Arthritis    Rheumatoid   . GERD (gastroesophageal reflux disease)   . Hypertension   . Rheumatoid arthritis(714.0)   . Thyroid disease    Family History  Problem Relation Age of Onset  . Stroke Mother   . Colon cancer Neg Hx    Past Surgical History:  Procedure Laterality Date  . OOPHORECTOMY  1983  . WRIST SURGERY  2009   for tendonitis   Social History   Occupational History  . Not on file.   Social History Main Topics  . Smoking status: Never Smoker  . Smokeless tobacco: Never Used  . Alcohol use 1.2 oz/week    2 Glasses of wine per week  . Drug use: No  . Sexual activity: Not on file

## 2017-08-29 NOTE — Patient Instructions (Signed)

## 2017-08-29 NOTE — Progress Notes (Deleted)
Pain in side of right hip when sleeping on right side. Has some groin pain especially when going up steps.

## 2017-09-01 MED ORDER — TRIAMCINOLONE ACETONIDE 40 MG/ML IJ SUSP
80.0000 mg | INTRAMUSCULAR | Status: AC | PRN
  Administered 2017-08-29: 80 mg via INTRA_ARTICULAR

## 2017-09-01 MED ORDER — BUPIVACAINE HCL 0.5 % IJ SOLN
3.0000 mL | INTRAMUSCULAR | Status: AC | PRN
  Administered 2017-08-29: 3 mL via INTRA_ARTICULAR

## 2017-09-22 ENCOUNTER — Other Ambulatory Visit (HOSPITAL_COMMUNITY): Payer: Self-pay | Admitting: Internal Medicine

## 2017-09-22 ENCOUNTER — Encounter: Payer: Self-pay | Admitting: *Deleted

## 2017-09-22 ENCOUNTER — Other Ambulatory Visit: Payer: Self-pay | Admitting: Student

## 2017-09-22 ENCOUNTER — Ambulatory Visit (HOSPITAL_COMMUNITY)
Admission: RE | Admit: 2017-09-22 | Discharge: 2017-09-22 | Disposition: A | Discharge status: Home / Self Care | Payer: Medicare Other | Source: Ambulatory Visit | Attending: Surgery | Admitting: Surgery

## 2017-09-22 ENCOUNTER — Other Ambulatory Visit (HOSPITAL_COMMUNITY): Payer: Self-pay | Admitting: Student

## 2017-09-22 ENCOUNTER — Ambulatory Visit (HOSPITAL_COMMUNITY)
Admission: RE | Admit: 2017-09-22 | Discharge: 2017-09-22 | Disposition: A | Discharge status: Home / Self Care | Payer: Medicare Other | Source: Ambulatory Visit | Attending: Student | Admitting: Student

## 2017-09-22 DIAGNOSIS — L02416 Cutaneous abscess of left lower limb: Secondary | ICD-10-CM | POA: Diagnosis not present

## 2017-09-22 DIAGNOSIS — R600 Localized edema: Secondary | ICD-10-CM | POA: Diagnosis present

## 2017-09-22 DIAGNOSIS — M79662 Pain in left lower leg: Secondary | ICD-10-CM

## 2017-09-22 DIAGNOSIS — L089 Local infection of the skin and subcutaneous tissue, unspecified: Secondary | ICD-10-CM | POA: Diagnosis present

## 2017-09-22 LAB — POCT I-STAT CREATININE: CREATININE: 0.8 mg/dL (ref 0.44–1.00)

## 2017-09-22 MED ORDER — IOPAMIDOL (ISOVUE-300) INJECTION 61%
100.0000 mL | Freq: Once | INTRAVENOUS | Status: AC | PRN
Start: 2017-09-22 — End: 2017-09-22
  Administered 2017-09-22: 100 mL via INTRAVENOUS

## 2017-09-22 MED ORDER — IOPAMIDOL (ISOVUE-300) INJECTION 61%
INTRAVENOUS | Status: AC
  Filled 2017-09-22: qty 100

## 2017-09-22 NOTE — Congregational Nurse Program (Signed)
[id number]/QHUTML Davis,BSN,RN3,CCM,CN:Patient presents today with an open wound of the left buttock/open area of one inch by 1/2 inch.  Non draining area with hardness to palpation.  Area surrounding the wound are slightly red and tender. Area cleaned with betadine swab and covered with bandage.  Patient instructed to call pcp office in the am [phone] to have area seen and possible draining.  Patient has been running a fever and is on clindamycin that was prescribed by MD on recent cruise ship.

## 2017-09-22 NOTE — Congregational Nurse Program (Signed)
Congregational Nurse Program Note  Date of Encounter: 09/22/2017  Past Medical History: Past Medical History:  Diagnosis Date  . Arthritis    Rheumatoid   . GERD (gastroesophageal reflux disease)   . Hypertension   . Rheumatoid arthritis(714.0)   . Thyroid disease     Encounter Details:     CNP Questionnaire - 09/22/17 1431      Patient Demographics   Is this a new or existing patient? New   Patient is considered a/an Not Applicable   Race Caucasian/White     Patient Assistance   Location of Patient Assistance Not Applicable   Patient's financial/insurance status Medicare   Uninsured Patient ([place] [employer]s) No   Patient referred to apply for the following financial assistance Not Applicable   Food insecurities addressed Not Applicable   Transportation assistance No   Assistance securing medications No   Educational health offerings Not Applicable     Encounter Details   Primary purpose of visit Acute Illness/Condition Visit   Was an Emergency Department visit averted? Yes   Does patient have a medical provider? Yes   Patient referred to Follow up with established PCP   Was a mental health screening completed? (GAINS tool) No   Does patient have dental issues? No   Does patient have vision issues? No   Does your patient have an abnormal blood pressure today? No   Since previous encounter, have you referred patient for abnormal blood pressure that resulted in a new diagnosis or medication change? No   Does your patient have an abnormal blood glucose today? No   Since previous encounter, have you referred patient for abnormal blood glucose that resulted in a new diagnosis or medication change? No   Was there a life-saving intervention made? No

## 2017-09-22 NOTE — Progress Notes (Signed)
Allison Whitaker 09/22/2017 3:40 PM Location: [facility] Patient #: [id number] DOB: 08/11/1941 Married / Language: English / Race: White Female   History of Present Illness The patient is a 76 year old female with RA on Methotrexate, who presents with a subcutaneous abscess. She was seen by Allison Agent, NP at [facility] today and was sent to [facility] for possible I&D of abscess/cellulitis on her left hip.   She states she noticed this area about 8 or 9 days ago when she was on a cruise to [place]. She woke up one day and noticed a small, dark, hard sore on her left hip with small amount of redness surrounding it. The redness began to spread and she developed a fever so she saw the doctor on the cruise and was given Augmentin. A few days later, it began draining cloudy/red material and was given Clindamycin instead. It was draining well until yesterday when it stopped all together.  She was given a shot of Rocephin and a rx for Doxycycline today by Allison Agent, NP which she has not filled yet. She denies fevers/chills. She states she has never had a soft tissue infection before. She mentions she had a steroid injection in her right hip last week, but denies any known trauma/insect bite to her left hip.   Allison Whitaker was also concerned for DVT during today's office visit due to LLE swelling, tenderness, and immobilization with flight from [facility] 2 days ago.  She ordered a doppler today which the patient reports was negative.  She is not on blood thinners, is not diabetic, and has rheumatoid arthritis on Methotrexate.   Past Surgical History  Oral Surgery   Diagnostic Studies History  Colonoscopy  within last year Mammogram  within last year Pap Smear  1-5 years ago  Allergies  No Known Drug Allergies 09/22/2017 Allergies Reconciled   Medication History Atenolol (25MG  Tablet, Oral) Active. Aspirin Childrens Chew (81MG  Tablet Chewable,  Oral) Active. Metamucil (0.52GM Capsule, Oral) Active. Lysine (1000MG  Tablet, Oral) Active. Methotrexate (Arthritis) (2.5MG  Tablet, Oral) Active. Omeprazole (20MG  Capsule DR, Oral) Active. Losartan Potassium-HCTZ (100-12.5MG  Tablet, Oral) Active. Levothyroxine Sodium (75MCG Tablet, Oral) Active. Oxybutynin Chloride (10MG  Tablet ER, Oral) Active. Meloxicam (10MG  Capsule, Oral) Active. Vitamin D (400UNIT Capsule, 1 (one) Oral) Active. Multiple Vitamin (1 (one) Oral) Active. Folic Acid (5MG /ML Solution, Injection) Active. Medications Reconciled  Social History Alcohol use  Occasional alcohol use. Caffeine use  Carbonated beverages, Tea. No drug use  Tobacco use  Never smoker.  Family History Arthritis  Father.  Pregnancy / Birth History  Age at menarche  24 years. Age of menopause  53-55 Contraceptive History  Oral contraceptives. Gravida  2 Irregular periods  Maternal age  61-20 Para  2  Other Problems  Arthritis  Gastroesophageal Reflux Disease  High blood pressure  Kidney Stone  Oophorectomy  Left. Other disease, cancer, significant illness  Thyroid Disease     Review of Systems General Not Present- Appetite Loss, Chills, Fatigue, Fever, Night Sweats, Weight Gain and Weight Loss. Skin Present- New Lesions. Not Present- Change in Wart/Mole, Dryness, Hives, Jaundice, Non-Healing Wounds, Rash and Ulcer. HEENT Not Present- Earache, Hearing Loss, Hoarseness, Nose Bleed, Oral Ulcers, Ringing in the Ears, Seasonal Allergies, Sinus Pain, Sore Throat, Visual Disturbances, Wears glasses/contact lenses and Yellow Eyes. Respiratory Not Present- Bloody sputum, Chronic Cough, Difficulty Breathing, Snoring and Wheezing. Breast Not Present- Breast Mass, Breast Pain, Nipple Discharge and Skin Changes. Cardiovascular Not Present- Chest Pain, Difficulty Breathing Lying  Down, Leg Cramps, Palpitations, Rapid Heart Rate, Shortness of Breath and Swelling of  Extremities. Gastrointestinal Not Present- Abdominal Pain, Bloating, Bloody Stool, Change in Bowel Habits, Chronic diarrhea, Constipation, Difficulty Swallowing, Excessive gas, Gets full quickly at meals, Hemorrhoids, Indigestion, Nausea, Rectal Pain and Vomiting. Female Genitourinary Present- Frequency. Not Present- Nocturia, Painful Urination, Pelvic Pain and Urgency. Musculoskeletal Not Present- Back Pain, Joint Pain, Joint Stiffness, Muscle Pain, Muscle Weakness and Swelling of Extremities. Neurological Not Present- Decreased Memory, Fainting, Headaches, Numbness, Seizures, Tingling, Tremor, Trouble walking and Weakness. Psychiatric Not Present- Anxiety, Bipolar, Change in Sleep Pattern, Depression, Fearful and Frequent crying. Endocrine Not Present- Cold Intolerance, Excessive Hunger, Hair Changes, Heat Intolerance, Hot flashes and New Diabetes. Hematology Not Present- Blood Thinners, Easy Bruising, Excessive bleeding, Gland problems, HIV and Persistent Infections.  Vitals  09/22/2017 3:42 PM Weight: 195.4 lb Height: 65in Body Surface Area: 1.96 m Body Mass Index: 32.52 kg/m  Temp.: 98.102F  Pulse: 92 (Regular)  BP: 118/80 (Sitting, Left Arm, Standard)   Physical Exam  GENERAL: Well-developed, well nourished female in no acute distress  EYES: No scleral icterus Pupils equal, lids normal  EXTERNAL EARS: Intact, no masses or lesions EXTERNAL NOSE: Intact, no masses or lesions MOUTH: Lips - no lesions Dentition - normal for age  RESPIRATORY: Normal effort, no use of accessory muscles  MUSCULOSKELETAL: Normal gait Grossly normal ROM upper extremities Grossly normal ROM lower extremities  SKIN: Warm and dry Not diaphoretic  PSYCHIATRIC: Normal judgement and insight Normal mood and affect Alert, oriented x 3  Integumentary Note: Left hip: 2 cm central opening with yellow drainage seen at surface and circumferential surrounding erythema (about 2 cm) 8  cm x 5 cm area of induration surrounding the opening Tender to palpation  Opening was probed with curved hemostats to attempt to promote drainage The area felt to be multiloculated and a small amount of clear/yellow/cloudy drainage appeared through the opening No obvious pus   Assessment & Plan  SOFT TISSUE INFECTION (L08.9) Impression:  Dr. Dalbert Batman evaluated the patient with me due to the atypical physical exam findings. The patient was advised to undergo a stat CT pelvis to evaluate the subcutaneous area. MRSA is a possible cause of this infection, considering we were not able to easily expel a large amount of purulence and the large amount of induration does not match the small amount of erythema on exam.  She was sent to [facility] for a CT scan and we will discuss further treatment once the scan is finalized. We discussed possibly going to the OR for incision and drainage, depending the results. Considering she has experienced failed outpatient treatment, she may need IV antibiotics as well. The on-call surgeon is aware.  Current Plans CAT SCAN OF PELVIS (97353) - STAT (soft tissue infection of left hip)  Signed electronically by Kellie Shropshire, PA C (09/22/2017 5:40 PM)

## 2021-04-13 ENCOUNTER — Ambulatory Visit: Payer: Self-pay

## 2021-04-13 ENCOUNTER — Ambulatory Visit: Payer: Medicare Other | Admitting: Orthopaedic Surgery

## 2021-04-13 ENCOUNTER — Other Ambulatory Visit: Payer: Self-pay

## 2021-04-13 ENCOUNTER — Encounter: Payer: Self-pay | Admitting: Orthopaedic Surgery

## 2021-04-13 DIAGNOSIS — M5441 Lumbago with sciatica, right side: Secondary | ICD-10-CM

## 2021-04-13 DIAGNOSIS — M25551 Pain in right hip: Secondary | ICD-10-CM | POA: Diagnosis not present

## 2021-04-13 DIAGNOSIS — G8929 Other chronic pain: Secondary | ICD-10-CM

## 2021-04-13 DIAGNOSIS — M5442 Lumbago with sciatica, left side: Secondary | ICD-10-CM

## 2021-04-13 MED ORDER — PREDNISONE 10 MG (21) PO TBPK: ORAL_TABLET | ORAL | 0 refills | Status: DC

## 2021-04-13 MED ORDER — TRAMADOL HCL 50 MG PO TABS: 50.0000 mg | ORAL_TABLET | Freq: Every evening | ORAL | 0 refills | Status: DC | PRN

## 2021-04-13 NOTE — Progress Notes (Signed)
Office Visit Note   Patient: Allison Whitaker           Date of Birth: 05-Apr-1941           MRN: [id number] Visit Date: 04/13/2021              Requested by: Burnard Bunting, MD [address] [place],  [place] [address] PCP: Burnard Bunting, MD   Assessment & Plan: Visit Diagnoses:  1. Chronic bilateral low back pain with bilateral sciatica   2. Pain in right hip     Plan: Based on clinical and radiographic findings her symptoms correlate with degenerative spine disease.  I have recommended a course of outpatient physical therapy after initial period of rest.  Prescription for tramadol as well as prednisone Dosepak.  She will also try heat as well.  Follow-up as needed.  Follow-Up Instructions: Return if symptoms worsen or fail to improve.   Orders:  Orders Placed This Encounter  Procedures  . XR Lumbar Spine 2-3 Views  . XR HIP UNILAT W OR W/O PELVIS 2-3 VIEWS RIGHT  . Ambulatory referral to Physical Therapy   Meds ordered this encounter  Medications  . predniSONE (STERAPRED UNI-PAK 21 TAB) 10 MG (21) TBPK tablet    Sig: Take as directed    Dispense:  21 tablet    Refill:  0  . traMADol (ULTRAM) 50 MG tablet    Sig: Take 1-2 tablets (50-100 mg total) by mouth at bedtime as needed.    Dispense:  20 tablet    Refill:  0      Procedures: No procedures performed   Clinical Data: No additional findings.   Subjective: Chief Complaint  Patient presents with  . Lower Back - Pain  . Right Hip - Pain    Allison Whitaker is a 80 year old female grandmother of Dorita Fray she who comes in for evaluation of right hip and low back pain with radiation down into the right leg.  She has had pain for about 3 to 4 months without any known injuries.  She finished a course of chiropractic care which only gave very temporary relief.  She has been using ice and meloxicam which really does not help that much.  Pain ranges from 4-8 out of 10.  Pain is worse at night.   Review of Systems   Constitutional: Negative.   HENT: Negative.   Eyes: Negative.   Respiratory: Negative.   Cardiovascular: Negative.   Endocrine: Negative.   Musculoskeletal: Negative.   Neurological: Negative.   Hematological: Negative.   Psychiatric/Behavioral: Negative.   All other systems reviewed and are negative.    Objective: Vital Signs: There were no vitals taken for this visit.  Physical Exam Vitals and nursing note reviewed.  Constitutional:      Appearance: She is well-developed.  HENT:     Head: Normocephalic and atraumatic.  Pulmonary:     Effort: Pulmonary effort is normal.  Abdominal:     Palpations: Abdomen is soft.  Musculoskeletal:     Cervical back: Neck supple.  Skin:    General: Skin is warm.     Capillary Refill: Capillary refill takes less than 2 seconds.  Neurological:     Mental Status: She is alert and oriented to person, place, and time.  Psychiatric:        Behavior: Behavior normal.        Thought Content: Thought content normal.        Judgment: Judgment normal.  Ortho Exam Right hip shows good range of motion without pain.  She has pain that localizes to the right lumbar paraspinous muscles.  Lateral hip is nontender.  No focal motor or sensory deficits.  Normal reflexes. Specialty Comments:  No specialty comments available.  Imaging: XR HIP UNILAT W OR W/O PELVIS 2-3 VIEWS RIGHT  Result Date: 04/13/2021 Moderate right hip joint space narrowing  XR Lumbar Spine 2-3 Views  Result Date: 04/13/2021 Multilevel spondylosis and degenerative disc disease.  Mild degenerative scoliosis.    PMFS History: There are no problems to display for this patient.  Past Medical History:  Diagnosis Date  . Arthritis    Rheumatoid   . GERD (gastroesophageal reflux disease)   . Hypertension   . Rheumatoid arthritis(714.0)   . Thyroid disease     Family History  Problem Relation Age of Onset  . Stroke Mother   . Colon cancer Neg Hx     Past  Surgical History:  Procedure Laterality Date  . OOPHORECTOMY  1983  . WRIST SURGERY  2009   for tendonitis   Social History   Occupational History  . Not on file  Tobacco Use  . Smoking status: Never Smoker  . Smokeless tobacco: Never Used  Substance and Sexual Activity  . Alcohol use: Yes    Alcohol/week: 2.0 standard drinks    Types: 2 Glasses of wine per week  . Drug use: No  . Sexual activity: Not on file

## 2021-04-27 ENCOUNTER — Other Ambulatory Visit: Payer: Self-pay

## 2021-04-27 ENCOUNTER — Encounter: Payer: Self-pay | Admitting: Rehabilitative and Restorative Service Providers"

## 2021-04-27 ENCOUNTER — Ambulatory Visit: Payer: Medicare Other | Admitting: Rehabilitative and Restorative Service Providers"

## 2021-04-27 DIAGNOSIS — R293 Abnormal posture: Secondary | ICD-10-CM | POA: Diagnosis not present

## 2021-04-27 DIAGNOSIS — M545 Low back pain, unspecified: Secondary | ICD-10-CM

## 2021-04-27 DIAGNOSIS — G8929 Other chronic pain: Secondary | ICD-10-CM

## 2021-04-27 DIAGNOSIS — M79605 Pain in left leg: Secondary | ICD-10-CM | POA: Diagnosis not present

## 2021-04-27 DIAGNOSIS — R262 Difficulty in walking, not elsewhere classified: Secondary | ICD-10-CM

## 2021-04-27 DIAGNOSIS — M79604 Pain in right leg: Secondary | ICD-10-CM | POA: Diagnosis not present

## 2021-04-27 NOTE — Therapy (Signed)
[place], [place], [address] Phone: [phone]   Fax:  [phone]  Physical Therapy Evaluation  Patient Details  Name: Allison Whitaker MRN: [id number] Date of Birth: 07/18/1941 Referring Provider (PT): Dr. Erlinda Hong   Encounter Date: 04/27/2021   PT End of Session - 04/27/21 1145    Visit Number 1    Number of Visits 12    Date for PT Re-Evaluation 06/22/21    Authorization Type UHC Medicare    Progress Note Due on Visit 10    PT Start Time 1150    PT Stop Time 1226    PT Time Calculation (min) 36 min    Activity Tolerance Patient tolerated treatment well    Behavior During Therapy [facility] for tasks assessed/performed           Past Medical History:  Diagnosis Date  . Arthritis    Rheumatoid   . GERD (gastroesophageal reflux disease)   . Hypertension   . Rheumatoid arthritis(714.0)   . Thyroid disease     Past Surgical History:  Procedure Laterality Date  . OOPHORECTOMY  1983  . WRIST SURGERY  2009   for tendonitis    There were no vitals filed for this visit.    Subjective Assessment - 04/27/21 1149    Subjective Pt. indicated having insidious onset of symptoms in back "several months ago" with discomfort with sitting prolonged.  Pt. indicated switching to harder chair heled some but still noted symptoms.  Pt. stated nighttime symptoms became more prominent with bed mobility and noting some back pain c shooting into hips.  Currently more Rt vs. Lt at this time but has had both.  Pt. stated weakness c prolonged standing some some symptoms noted in back of thighs.  Pt. stated chiro visits seemed to help in short term for a few times but still ended with same symptoms.    Pertinent History HTN, RA, degenerative spine disease per MD note    Limitations Sitting;Standing;House hold activities    How long can you sit comfortably? about 1 hour    How long can you stand comfortably? 20-30 mins    Currently in Pain? Yes     Pain Score 7    pain at worst   Pain Location Back    Pain Orientation Left;Right    Pain Descriptors / Indicators Aching    Pain Type Chronic pain    Pain Radiating Towards bilateral hip/posterior thighs at times    Pain Onset More than a month ago    Pain Frequency Intermittent    Aggravating Factors  prolonged sitting, prolonged standing, bed mobility at night    Pain Relieving Factors change positioning, prednisone helped    Effect of Pain on Daily Activities Limited in sitting, standing activity              [facility] PT Assessment - 04/27/21 0001      Assessment   Medical Diagnosis Low back pain    Referring Provider (PT) Dr. Erlinda Hong    Onset Date/Surgical Date 02/20/21    Hand Dominance Right      Precautions   Precautions None      Restrictions   Weight Bearing Restrictions No      Balance Screen   Has the patient fallen in the past 6 months No    Has the patient had a decrease in activity level because of a fear of falling?  No    Is the patient  reluctant to leave their home because of a fear of falling?  No      Home Environment   Living Environment Private residence    Additional Comments stairs for second story but not required.  3 stairs to enter house      Prior Function   Level of Independence Independent    Leisure Help at church (walking/standing), household activity/yard work (not heavy)      Observation/Other Assessments   Focus on Therapeutic Outcomes (FOTO)  intake 54, predicted 56      Functional Tests   Functional tests Sit to Stand      Sit to Stand   Comments 18 inch chair s UE assist      Posture/Postural Control   Posture Comments Mild reduction in lumbar lordosis in standing      ROM / Strength   AROM / PROM / Strength Strength;PROM;AROM      AROM   Overall AROM Comments No centralization/peripherialization noted in lumbar movement today in clinic.    AROM Assessment Site Lumbar;Hip    Right/Left Hip Left;Right    Lumbar Flexion to  ankles c stretch in back of thighs, knees    Lumbar Extension 50% WFL, repeated x 5 improved movement to 100% WFL    Lumbar - Right Side Bend to knee joint line no complaints    Lumbar - Left Side Bend to knee joint line no complaints      Strength   Strength Assessment Site Hip;Knee;Ankle    Right/Left Hip Right;Left    Right Hip Flexion 5/5    Left Hip Flexion 5/5    Right/Left Knee Left;Right    Right Knee Flexion 5/5    Right Knee Extension 5/5    Left Knee Flexion 5/5    Left Knee Extension 5/5    Right/Left Ankle Left;Right    Right Ankle Dorsiflexion 5/5    Left Ankle Dorsiflexion 5/5      Flexibility   Soft Tissue Assessment /Muscle Length yes    Hamstrings supine passive SLR Lt and Rt 80 degrees bilateral c posterior thigh pulling noted      Special Tests   Other special tests (-) Slump bilateral, (-) Crossed SLR bilateral                      Objective measurements completed on examination: See above findings.       [facility] Adult PT Treatment/Exercise - 04/27/21 0001      Exercises   Exercises Other Exercises;Lumbar    Other Exercises  HEP instruction/performance c cues for techniques, handout provided.  Trial sets performed of each for comprehension and symptom assessment.      Lumbar Exercises: Stretches   Passive Hamstring Stretch 2 reps;30 seconds;Right;Left   c strap in supine   Lower Trunk Rotation Other (comment)   15 sec x 3 bilateral     Lumbar Exercises: Standing   Other Standing Lumbar Exercises lumbar extension standing, hands on hips x 5      Lumbar Exercises: Supine   Bridge 10 reps                  PT Education - 04/27/21 1145    Education Details HEP, POC    Person(s) Educated Patient    Methods Explanation;Verbal cues;Handout;Demonstration    Comprehension Returned demonstration;Verbalized understanding            PT Short Term Goals - 04/27/21 1145  PT SHORT TERM GOAL #1   Title Patient will demonstrate  independent use of home exercise program to maintain progress from in clinic treatments.    Time 2    Period Weeks    Status New    Target Date 05/11/21             PT Long Term Goals - 04/27/21 1145      PT LONG TERM GOAL #1   Title Patient will demonstrate/report pain at worst less than or equal to 2/10 to facilitate minimal limitation in daily activity secondary to pain symptoms.    Time 8    Period Weeks    Status New    Target Date 06/22/21      PT LONG TERM GOAL #2   Title Patient will demonstrate independent use of home exercise program to facilitate ability to maintain/progress functional gains from skilled physical therapy services.    Time 8    Period Weeks    Status New    Target Date 06/22/21      PT LONG TERM GOAL #3   Title Pt. will demonstrate lumbar ext 100 % WFL s symptoms to facilitate upright standing,walking at PLOF unrestricted.    Time 8    Period Weeks    Status New    Target Date 06/22/21      PT LONG TERM GOAL #4   Title Pt. will demonstrate FOTO outcome > or = 56 to indicated reduced disability due to condition.    Time 8    Period Weeks    Status New    Target Date 06/22/21      PT LONG TERM GOAL #5   Title Pt. will demonstrate/report ability to perform standing, sitting > 1 hour for household and church related activity at PLOF.    Time 8    Period Weeks    Status New    Target Date 06/22/21                  Plan - 04/27/21 1223    Clinical Impression Statement Patient is a 80 y.o. who comes to clinic with complaints of low back, bilateral hip/thigh pain with mobility deficits primary that impair their ability to perform usual daily and recreational functional activities without increase difficulty/symptoms at this time.  Patient to benefit from skilled PT services to address impairments and limitations to improve to previous level of function without restriction secondary to condition.    Personal Factors and Comorbidities  Comorbidity 2    Comorbidities HTN, RA    Examination-Activity Limitations Sit;Sleep;Squat;Stand;Transfers;Bed Mobility    Examination-Participation Restrictions Church;Community Activity;[place] Work   home activity   Stability/Clinical Decision Making Stable/Uncomplicated    Clinical Decision Making Low    Rehab Potential Good    PT Frequency --   1-2x/week   PT Duration 8 weeks    PT Treatment/Interventions ADLs/Self Care Home Management;Cryotherapy;Electrical Stimulation;Iontophoresis 4mg /ml Dexamethasone;Moist Heat;Traction;Balance training;Therapeutic exercise;Therapeutic activities;Functional mobility training;Stair training;Gait training;DME Instruction;Ultrasound;Neuromuscular re-education;Patient/family education;Passive range of motion;Spinal Manipulations;Joint Manipulations;Dry needling;Taping;Manual techniques    PT Next Visit Plan Review existing HEP, promote lumbar/hip mobility including hip flexor/quad assessment (thomas testing)    PT Home Exercise Plan [id number]    Consulted and Agree with Plan of Care Patient           Patient will benefit from skilled therapeutic intervention in order to improve the following deficits and impairments:  Decreased endurance,Hypomobility,Pain,Decreased activity tolerance,Impaired perceived functional ability,Improper body mechanics,Postural dysfunction,Impaired flexibility,Decreased range of motion,Decreased  mobility,Difficulty walking  Visit Diagnosis: Chronic bilateral low back pain, unspecified whether sciatica present  Pain in left leg  Pain in right leg  Abnormal posture  Difficulty in walking, not elsewhere classified     Problem List There are no problems to display for this patient.  Scot Jun, PT, DPT, OCS, ATC 04/27/21  12:31 PM    [facility] [address] [place], [place], [address] Phone: [phone]   Fax:  [phone]  Name: Allison Whitaker MRN: [id number] Date of  Birth: [birth year]

## 2021-04-27 NOTE — Patient Instructions (Signed)
Access Code: [email or web address] URL: [email or web address] Date: 04/27/2021 Prepared by: Scot Jun  Exercises Supine Lower Trunk Rotation - 2 x daily - 7 x weekly - 5 reps - 1 sets - 15 hold Supine Hamstring Stretch with Strap - 2 x daily - 7 x weekly - 1 sets - 5 reps - 30 hold Supine Bridge - 2 x daily - 7 x weekly - 10 reps - 3 sets - 2 hold Standing Lumbar Extension - 2 x daily - 7 x weekly - 2-3 sets - 5-10 reps

## 2021-04-30 ENCOUNTER — Ambulatory Visit: Payer: Medicare Other | Admitting: Physical Therapy

## 2021-04-30 ENCOUNTER — Other Ambulatory Visit: Payer: Self-pay

## 2021-04-30 ENCOUNTER — Encounter: Payer: Self-pay | Admitting: Physical Therapy

## 2021-04-30 DIAGNOSIS — R293 Abnormal posture: Secondary | ICD-10-CM

## 2021-04-30 DIAGNOSIS — G8929 Other chronic pain: Secondary | ICD-10-CM

## 2021-04-30 DIAGNOSIS — M545 Low back pain, unspecified: Secondary | ICD-10-CM

## 2021-04-30 DIAGNOSIS — M79605 Pain in left leg: Secondary | ICD-10-CM

## 2021-04-30 DIAGNOSIS — M79604 Pain in right leg: Secondary | ICD-10-CM

## 2021-04-30 DIAGNOSIS — R262 Difficulty in walking, not elsewhere classified: Secondary | ICD-10-CM

## 2021-04-30 NOTE — Therapy (Signed)
[facility] [place], [place], [address] Phone: [phone]   Fax:  [phone]  Physical Therapy Treatment  Patient Details  Name: Allison Whitaker MRN: [id number] Date of Birth: May 31, 1941 Referring Provider (PT): Dr. Erlinda Hong   Encounter Date: 04/30/2021   PT End of Session - 04/30/21 1016    Visit Number 2    Number of Visits 12    Date for PT Re-Evaluation 06/22/21    Authorization Type UHC Medicare    Progress Note Due on Visit 10    PT Start Time 1015    PT Stop Time 1109    PT Time Calculation (min) 54 min    Activity Tolerance Patient tolerated treatment well    Behavior During Therapy [facility] for tasks assessed/performed           Past Medical History:  Diagnosis Date  . Arthritis    Rheumatoid   . GERD (gastroesophageal reflux disease)   . Hypertension   . Rheumatoid arthritis(714.0)   . Thyroid disease     Past Surgical History:  Procedure Laterality Date  . OOPHORECTOMY  1983  . WRIST SURGERY  2009   for tendonitis    There were no vitals filed for this visit.   Subjective Assessment - 04/30/21 1017    Subjective Pt reports she has some pulling in the low back and pain into the back of her thighs - sore and weak feeling.    Patient Stated Goals stand and walk for longer periods of time.    Currently in Pain? Yes    Pain Score 6     Pain Location Leg    Pain Orientation Right;Left;Posterior    Pain Descriptors / Indicators Sore    Pain Type Chronic pain                             [facility] Adult PT Treatment/Exercise - 04/30/21 0001      Exercises   Exercises Lumbar      Lumbar Exercises: Stretches   Active Hamstring Stretch Left;Right;5 reps   3 sets   Single Knee to Chest Stretch Left;Right;5 reps   3 sets   Hip Flexor Stretch Left;Right;5 reps   3 sets - leg lengtheners   Figure 4 Stretch 5 reps;With overpressure   3 sets   Other Lumbar Stretch Exercise hooklying hip rotations -  windshield      Lumbar Exercises: Aerobic   Nustep L7x6' LE only      Modalities   Modalities Moist Heat      Moist Heat Therapy   Number Minutes Moist Heat 10 Minutes    Moist Heat Location --   Rt gluts, TFL and QL in sidelying     Manual Therapy   Manual Therapy Soft tissue mobilization    Soft tissue mobilization STM to Rt gluts, TFL and QL in prone                  PT Education - 04/30/21 1139    Education Details DN    Person(s) Educated Patient    Methods Explanation;Handout    Comprehension Verbalized understanding            PT Short Term Goals - 04/27/21 1145      PT SHORT TERM GOAL #1   Title Patient will demonstrate independent use of home exercise program to maintain progress from in clinic treatments.    Time  2    Period Weeks    Status New    Target Date 05/11/21             PT Long Term Goals - 04/27/21 1145      PT LONG TERM GOAL #1   Title Patient will demonstrate/report pain at worst less than or equal to 2/10 to facilitate minimal limitation in daily activity secondary to pain symptoms.    Time 8    Period Weeks    Status New    Target Date 06/22/21      PT LONG TERM GOAL #2   Title Patient will demonstrate independent use of home exercise program to facilitate ability to maintain/progress functional gains from skilled physical therapy services.    Time 8    Period Weeks    Status New    Target Date 06/22/21      PT LONG TERM GOAL #3   Title Pt. will demonstrate lumbar ext 100 % WFL s symptoms to facilitate upright standing,walking at PLOF unrestricted.    Time 8    Period Weeks    Status New    Target Date 06/22/21      PT LONG TERM GOAL #4   Title Pt. will demonstrate FOTO outcome > or = 56 to indicated reduced disability due to condition.    Time 8    Period Weeks    Status New    Target Date 06/22/21      PT LONG TERM GOAL #5   Title Pt. will demonstrate/report ability to perform standing, sitting > 1 hour for  household and church related activity at PLOF.    Time 8    Period Weeks    Status New    Target Date 06/22/21                 Plan - 04/30/21 1019    Clinical Impression Statement Allison Whitaker doing well with her HEP, todays focus was on hip, glut and lumbar mobility  and flexibility. She reports soreness/tightness in the posterior thighs as her main complaint. She has palpable tightness and tenderness in the Rt TFL, QL and gluts, would benefit from DN if she is open to this.    Rehab Potential Good    PT Duration 8 weeks    PT Treatment/Interventions ADLs/Self Care Home Management;Cryotherapy;Electrical Stimulation;Iontophoresis 4mg /ml Dexamethasone;Moist Heat;Traction;Balance training;Therapeutic exercise;Therapeutic activities;Functional mobility training;Stair training;Gait training;DME Instruction;Ultrasound;Neuromuscular re-education;Patient/family education;Passive range of motion;Spinal Manipulations;Joint Manipulations;Dry needling;Taping;Manual techniques    PT Next Visit Plan possible DN to Rt TFL, QL and gluts, cores stability and flexibility    PT Home Exercise Plan [email or web address]    Consulted and Agree with Plan of Care Patient           Patient will benefit from skilled therapeutic intervention in order to improve the following deficits and impairments:  Decreased endurance,Hypomobility,Pain,Decreased activity tolerance,Impaired perceived functional ability,Improper body mechanics,Postural dysfunction,Impaired flexibility,Decreased range of motion,Decreased mobility,Difficulty walking  Visit Diagnosis: Chronic bilateral low back pain, unspecified whether sciatica present  Pain in left leg  Pain in right leg  Abnormal posture  Difficulty in walking, not elsewhere classified     Problem List There are no problems to display for this patient.   Jeral Pinch PT  04/30/2021, 11:41 AM  [facility] [address] [place], [place],  [address] Phone: [phone]   Fax:  [phone]  Name: Allison Whitaker MRN: [id number] Date of Birth: 1941/02/02

## 2021-05-11 ENCOUNTER — Encounter: Payer: Self-pay | Admitting: Rehabilitative and Restorative Service Providers"

## 2021-05-11 ENCOUNTER — Ambulatory Visit: Payer: Medicare Other | Admitting: Rehabilitative and Restorative Service Providers"

## 2021-05-11 ENCOUNTER — Other Ambulatory Visit: Payer: Self-pay

## 2021-05-11 DIAGNOSIS — R293 Abnormal posture: Secondary | ICD-10-CM

## 2021-05-11 DIAGNOSIS — M545 Low back pain, unspecified: Secondary | ICD-10-CM

## 2021-05-11 DIAGNOSIS — M79605 Pain in left leg: Secondary | ICD-10-CM

## 2021-05-11 DIAGNOSIS — M79604 Pain in right leg: Secondary | ICD-10-CM | POA: Diagnosis not present

## 2021-05-11 DIAGNOSIS — R262 Difficulty in walking, not elsewhere classified: Secondary | ICD-10-CM

## 2021-05-11 DIAGNOSIS — G8929 Other chronic pain: Secondary | ICD-10-CM

## 2021-05-11 NOTE — Therapy (Signed)
[place], [place], [address] Phone: [phone]   Fax:  [phone]  Physical Therapy Treatment  Patient Details  Name: Allison Whitaker MRN: [id number] Date of Birth: 06/22/1941 Referring Provider (PT): Dr. Erlinda Hong   Encounter Date: 05/11/2021   PT End of Session - 05/11/21 1010    Visit Number 3    Number of Visits 12    Date for PT Re-Evaluation 06/22/21    Authorization Type U[employer] Medicare    Progress Note Due on Visit 10    PT Start Time 1013    PT Stop Time 1053    PT Time Calculation (min) 40 min    Activity Tolerance Patient tolerated treatment well    Behavior During Therapy [facility] for tasks assessed/performed           Past Medical History:  Diagnosis Date  . Arthritis    Rheumatoid   . GERD (gastroesophageal reflux disease)   . Hypertension   . Rheumatoid arthritis(714.0)   . Thyroid disease     Past Surgical History:  Procedure Laterality Date  . OOPHORECTOMY  1983  . WRIST SURGERY  2009   for tendonitis    There were no vitals filed for this visit.   Subjective Assessment - 05/11/21 1014    Subjective Pt. stated doing pretty good this morning, legs not hurting today.  Pt. stated she is doing the exercises mostly as described.  Sometimes feels worse during but not specific to a certain exercise.    Limitations Sitting;Standing;House hold activities    Patient Stated Goals stand and walk for longer periods of time.    Currently in Pain? No/denies    Pain Score 0-No pain              [facility] PT Assessment - 05/11/21 0001      Assessment   Medical Diagnosis Low back pain    Referring Provider (PT) Dr. Erlinda Hong    Onset Date/Surgical Date 02/20/21    Hand Dominance Right      AROM   Lumbar Flexion to toes, no complaints    Lumbar Extension 75%, pulling in Rt leg produced.  repeated x 5, improved to 100 % WFL c similar pulling in Rt leg      Flexibility   Soft Tissue Assessment /Muscle Length yes     Hamstrings Supine passive SLR Lt 90 degrees, Rt 85 degrees    Quadriceps (+) thomas test on Rt for rectus thightness                         [facility] Adult PT Treatment/Exercise - 05/11/21 0001      Lumbar Exercises: Stretches   Passive Hamstring Stretch 30 seconds;3 reps;Left;Right   c strap   Lower Trunk Rotation 3 reps   15 sec x 3 bilateral   Figure 4 Stretch 3 reps   15 sec x 3 bilateral (pulling towards body)     Lumbar Exercises: Aerobic   Nustep Lvl 6 6 mins      Lumbar Exercises: Standing   Other Standing Lumbar Exercises lumbar extension x 5      Lumbar Exercises: Sidelying   Clam Right   3 x 10     Manual Therapy   Manual therapy comments percussive device STM to Rt glute med/max                  PT Education - 05/11/21 1047  Education Details Additions to [employer]) Educated Patient    Methods Explanation;Demonstration;Verbal cues;Handout    Comprehension Returned demonstration;Verbalized understanding            PT Short Term Goals - 05/11/21 1013      PT SHORT TERM GOAL #1   Title Patient will demonstrate independent use of home exercise program to maintain progress from in clinic treatments.    Time 2    Period Weeks    Status Achieved    Target Date 05/11/21             PT Long Term Goals - 04/27/21 1145      PT LONG TERM GOAL #1   Title Patient will demonstrate/report pain at worst less than or equal to 2/10 to facilitate minimal limitation in daily activity secondary to pain symptoms.    Time 8    Period Weeks    Status New    Target Date 06/22/21      PT LONG TERM GOAL #2   Title Patient will demonstrate independent use of home exercise program to facilitate ability to maintain/progress functional gains from skilled physical therapy services.    Time 8    Period Weeks    Status New    Target Date 06/22/21      PT LONG TERM GOAL #3   Title Pt. will demonstrate lumbar ext 100 % WFL s symptoms to  facilitate upright standing,walking at PLOF unrestricted.    Time 8    Period Weeks    Status New    Target Date 06/22/21      PT LONG TERM GOAL #4   Title Pt. will demonstrate FOTO outcome > or = 56 to indicated reduced disability due to condition.    Time 8    Period Weeks    Status New    Target Date 06/22/21      PT LONG TERM GOAL #5   Title Pt. will demonstrate/report ability to perform standing, sitting > 1 hour for household and church related activity at PLOF.    Time 8    Period Weeks    Status New    Target Date 06/22/21                 Plan - 05/11/21 1037    Clinical Impression Statement Trigger point tenderness in posterior/lateral hip still noted today but overall reduced.  Use percussive device was beneficial per Pt. report.  Reassessment of mobility revealed gains noted compared to evaluation.  Reviewed HEP to avoid exacerbation of symptoms due to pulling or moving to far and generating pain response.    Personal Factors and Comorbidities Comorbidity 2    Comorbidities HTN, RA    Examination-Participation Restrictions Church;Community Activity;Yard Work    Stability/Clinical Decision Making Stable/Uncomplicated    Rehab Potential Good    PT Duration 8 weeks    PT Treatment/Interventions ADLs/Self Care Home Management;Cryotherapy;Electrical Stimulation;Iontophoresis 4mg /ml Dexamethasone;Moist Heat;Traction;Balance training;Therapeutic exercise;Therapeutic activities;Functional mobility training;Stair training;Gait training;DME Instruction;Ultrasound;Neuromuscular re-education;Patient/family education;Passive range of motion;Spinal Manipulations;Joint Manipulations;Dry needling;Taping;Manual techniques    PT Next Visit Plan Recheck trigger point complaints in posterior/lateral hip.  Continue hip and lumbar mobility gains and strengthening in functional tasks.    PT Home Exercise Plan [email or web address]    Consulted and Agree with Plan of Care Patient            Patient will benefit from skilled therapeutic intervention in order to improve the following deficits and impairments:  Decreased endurance,Hypomobility,Pain,Decreased activity tolerance,Impaired perceived functional ability,Improper body mechanics,Postural dysfunction,Impaired flexibility,Decreased range of motion,Decreased mobility,Difficulty walking  Visit Diagnosis: Chronic bilateral low back pain, unspecified whether sciatica present  Pain in left leg  Pain in right leg  Abnormal posture  Difficulty in walking, not elsewhere classified     Problem List There are no problems to display for this patient.  Scot Jun, PT, DPT, OCS, ATC 05/11/21  10:48 AM    [facility] [address] [place], [place], [address] Phone: [phone]   Fax:  [phone]  Name: Allison Whitaker MRN: [id number] Date of Birth: 07-07-1941

## 2021-05-11 NOTE — Patient Instructions (Signed)
Access Code: [email or web address] URL: [email or web address] Date: 05/11/2021 Prepared by: Scot Jun  Exercises Supine Lower Trunk Rotation - 2 x daily - 7 x weekly - 5 reps - 1 sets - 15 hold Supine Hamstring Stretch with Strap - 2 x daily - 7 x weekly - 1 sets - 5 reps - 30 hold Supine Bridge - 2 x daily - 7 x weekly - 10 reps - 3 sets - 2 hold Standing Lumbar Extension - 2 x daily - 7 x weekly - 2-3 sets - 5-10 reps Clamshell - 2 x daily - 7 x weekly - 3 sets - 10 reps Supine Piriformis Stretch Pulling Heel to Hip - 2 x daily - 7 x weekly - 3 sets - 10 reps

## 2021-05-17 ENCOUNTER — Ambulatory Visit: Payer: Medicare Other | Admitting: Rehabilitative and Restorative Service Providers"

## 2021-05-17 ENCOUNTER — Encounter: Payer: Self-pay | Admitting: Rehabilitative and Restorative Service Providers"

## 2021-05-17 ENCOUNTER — Other Ambulatory Visit: Payer: Self-pay

## 2021-05-17 DIAGNOSIS — M79605 Pain in left leg: Secondary | ICD-10-CM | POA: Diagnosis not present

## 2021-05-17 DIAGNOSIS — R293 Abnormal posture: Secondary | ICD-10-CM

## 2021-05-17 DIAGNOSIS — R262 Difficulty in walking, not elsewhere classified: Secondary | ICD-10-CM

## 2021-05-17 DIAGNOSIS — M545 Low back pain, unspecified: Secondary | ICD-10-CM

## 2021-05-17 DIAGNOSIS — M79604 Pain in right leg: Secondary | ICD-10-CM | POA: Diagnosis not present

## 2021-05-17 DIAGNOSIS — G8929 Other chronic pain: Secondary | ICD-10-CM

## 2021-05-17 NOTE — Therapy (Signed)
[place], [place], [address] Phone: [phone]   Fax:  [phone]  Physical Therapy Treatment  Patient Details  Name: Allison Whitaker MRN: [id number] Date of Birth: 1941/01/13 Referring Provider (PT): Dr. Erlinda Hong   Encounter Date: 05/17/2021   PT End of Session - 05/17/21 1601    Visit Number 4    Number of Visits 12    Date for PT Re-Evaluation 06/22/21    Authorization Type UHC Medicare    Progress Note Due on Visit 10    PT Start Time 1553    PT Stop Time 1632    PT Time Calculation (min) 39 min    Activity Tolerance Patient tolerated treatment well    Behavior During Therapy [facility] for tasks assessed/performed           Past Medical History:  Diagnosis Date  . Arthritis    Rheumatoid   . GERD (gastroesophageal reflux disease)   . Hypertension   . Rheumatoid arthritis(714.0)   . Thyroid disease     Past Surgical History:  Procedure Laterality Date  . OOPHORECTOMY  1983  . WRIST SURGERY  2009   for tendonitis    There were no vitals filed for this visit.   Subjective Assessment - 05/17/21 1557    Subjective Pt. stated 4/10 or so today.  Pt. stated some days are better and some days still hurt some.  Pt. stated Tuesday was progressively worse during day.    Limitations Sitting;Standing;House hold activities    Patient Stated Goals stand and walk for longer periods of time.    Currently in Pain? Yes    Pain Score 2     Pain Location Leg    Pain Orientation Right;Left;Posterior    Pain Descriptors / Indicators Sore    Pain Type Chronic pain    Pain Onset More than a month ago    Pain Frequency Intermittent    Aggravating Factors  unsure of why it increased on tuesday, increased tightness c leg stretching    Pain Relieving Factors rest, progressively better the next morning                             [facility] Adult PT Treatment/Exercise - 05/17/21 0001      Lumbar Exercises: Aerobic    Nustep Lvl 6 10 mins      Lumbar Exercises: Seated   Other Seated Lumbar Exercises seated SLR 2 x 10 bilateral      Lumbar Exercises: Supine   Bridge 15 reps    Other Supine Lumbar Exercises sciatic nerve flossing (ext knee c PF, flexion knee c DF) in supine 2 x 10 bilateral      Lumbar Exercises: Sidelying   Clam Right;Left   3 x 10     Manual Therapy   Manual therapy comments percussive device STM to Rt glute med/max                    PT Short Term Goals - 05/11/21 1013      PT SHORT TERM GOAL #1   Title Patient will demonstrate independent use of home exercise program to maintain progress from in clinic treatments.    Time 2    Period Weeks    Status Achieved    Target Date 05/11/21             PT Long Term Goals - 04/27/21 1145  PT LONG TERM GOAL #1   Title Patient will demonstrate/report pain at worst less than or equal to 2/10 to facilitate minimal limitation in daily activity secondary to pain symptoms.    Time 8    Period Weeks    Status New    Target Date 06/22/21      PT LONG TERM GOAL #2   Title Patient will demonstrate independent use of home exercise program to facilitate ability to maintain/progress functional gains from skilled physical therapy services.    Time 8    Period Weeks    Status New    Target Date 06/22/21      PT LONG TERM GOAL #3   Title Pt. will demonstrate lumbar ext 100 % WFL s symptoms to facilitate upright standing,walking at PLOF unrestricted.    Time 8    Period Weeks    Status New    Target Date 06/22/21      PT LONG TERM GOAL #4   Title Pt. will demonstrate FOTO outcome > or = 56 to indicated reduced disability due to condition.    Time 8    Period Weeks    Status New    Target Date 06/22/21      PT LONG TERM GOAL #5   Title Pt. will demonstrate/report ability to perform standing, sitting > 1 hour for household and church related activity at PLOF.    Time 8    Period Weeks    Status New    Target  Date 06/22/21                 Plan - 05/17/21 1625    Clinical Impression Statement Addition of sciatic nerve neurodynamic improvement intervention c flossing today to improve mobility in LE.  Continued presentation of two distinct complaints (lateral hip, posterior thigh) that are still present and impairing function at times. Pt. to benefit from continued building of strength in hips and LE as well as continued mobility gains.    Personal Factors and Comorbidities Comorbidity 2    Comorbidities HTN, RA    Examination-Participation Restrictions Church;Community Activity;Yard Work    Stability/Clinical Decision Making Stable/Uncomplicated    Rehab Potential Good    PT Duration 8 weeks    PT Treatment/Interventions ADLs/Self Care Home Management;Cryotherapy;Electrical Stimulation;Iontophoresis 4mg /ml Dexamethasone;Moist Heat;Traction;Balance training;Therapeutic exercise;Therapeutic activities;Functional mobility training;Stair training;Gait training;DME Instruction;Ultrasound;Neuromuscular re-education;Patient/family education;Passive range of motion;Spinal Manipulations;Joint Manipulations;Dry needling;Taping;Manual techniques    PT Next Visit Plan Percusive device in posterior/lateral hip.  Continue hip and lumbar mobility gains and strengthening in functional tasks, recheck nerve neurodynamics    PT [place] and Agree with Plan of Care Patient           Patient will benefit from skilled therapeutic intervention in order to improve the following deficits and impairments:  Decreased endurance,Hypomobility,Pain,Decreased activity tolerance,Impaired perceived functional ability,Improper body mechanics,Postural dysfunction,Impaired flexibility,Decreased range of motion,Decreased mobility,Difficulty walking  Visit Diagnosis: Chronic bilateral low back pain, unspecified whether sciatica present  Pain in left leg  Pain in right leg  Abnormal  posture  Difficulty in walking, not elsewhere classified     Problem List There are no problems to display for this patient.  Scot Jun, PT, DPT, OCS, ATC 05/17/21  4:28 PM    [place] [facility] [address] [place], [place], [address] Phone: [phone]   Fax:  [phone]  Name: Allison Whitaker MRN: [id number] Date of Birth: March 11, 1941

## 2021-05-23 ENCOUNTER — Encounter: Payer: Medicare Other | Admitting: Rehabilitative and Restorative Service Providers"

## 2021-06-01 ENCOUNTER — Other Ambulatory Visit: Payer: Self-pay

## 2021-06-01 ENCOUNTER — Encounter: Payer: Self-pay | Admitting: Rehabilitative and Restorative Service Providers"

## 2021-06-01 ENCOUNTER — Ambulatory Visit: Payer: Medicare Other | Admitting: Rehabilitative and Restorative Service Providers"

## 2021-06-01 DIAGNOSIS — R262 Difficulty in walking, not elsewhere classified: Secondary | ICD-10-CM

## 2021-06-01 DIAGNOSIS — M545 Low back pain, unspecified: Secondary | ICD-10-CM

## 2021-06-01 DIAGNOSIS — M79604 Pain in right leg: Secondary | ICD-10-CM

## 2021-06-01 DIAGNOSIS — G8929 Other chronic pain: Secondary | ICD-10-CM

## 2021-06-01 DIAGNOSIS — M79605 Pain in left leg: Secondary | ICD-10-CM | POA: Diagnosis not present

## 2021-06-01 NOTE — Therapy (Addendum)
[facility] [address] [place], [place], [address] Phone: [phone]   Fax:  [phone]  Physical Therapy Treatment  Patient Details  Name: Allison Whitaker MRN: [id number] Date of Birth: [birth year] Referring Provider (PT): Dr. Erlinda Hong   Encounter Date: 06/01/2021   PT End of Session - 06/01/21 1033     Visit Number 5    Number of Visits 12    Date for PT Re-Evaluation 06/22/21    Authorization Type UHC Medicare    Progress Note Due on Visit 10    PT Start Time 1015    PT Stop Time 1054    PT Time Calculation (min) 39 min    Activity Tolerance Patient tolerated treatment well    Behavior During Therapy WFL for tasks assessed/performed             Past Medical History:  Diagnosis Date   Arthritis    Rheumatoid    GERD (gastroesophageal reflux disease)    Hypertension    Rheumatoid arthritis(714.0)    Thyroid disease     Past Surgical History:  Procedure Laterality Date   [place]  2009   for tendonitis    There were no vitals filed for this visit.   Subjective Assessment - 06/01/21 1020     Subjective Pt. indicated symptoms around 5/10 or so.  Similar overall complaints noted.    Pertinent History HTN, RA, degenerative spine disease per MD note    Limitations Sitting;Standing;House hold activities    Patient Stated Goals stand and walk for longer periods of time.    Currently in Pain? Yes    Pain Score 5     Pain Location Leg    Pain Orientation Left;Right;Posterior    Pain Descriptors / Indicators Sore;Aching    Pain Type Chronic pain    Pain Onset More than a month ago    Pain Frequency Intermittent    Aggravating Factors  standing worsened at times    Pain Relieving Factors unsure except for rest                [facility] PT Assessment - 06/01/21 0001       Assessment   Medical Diagnosis Low back pain    Referring Provider (PT) Dr. Erlinda Hong    Onset Date/Surgical Date 02/20/21    Hand  Dominance Right                           [facility] Adult PT Treatment/Exercise - 06/01/21 0001       Lumbar Exercises: Stretches   Single Knee to Chest Stretch 30 seconds;3 reps;Left;Right    Figure 4 Stretch 30 seconds;5 reps   bilateral     Lumbar Exercises: Aerobic   Nustep Lvl 6 10 mins      Lumbar Exercises: Supine   Bridge 15 reps;2 seconds    Other Supine Lumbar Exercises sciatic nerve flossing (ext knee c PF, flexion knee c DF) in supine 2 x 10 bilateral           Manual:  compression to Rt glute med/max, palpation during dry needling   Trigger Point Dry Needling - 06/01/21 0001     Consent Given? Yes    Education Handout Provided Yes    Muscles Treated Lower Quadrant Hamstring   proximal 1/3 Rt   Muscles Treated Back/Hip Gluteus maximus   Rt   Hamstring Response Twitch response elicited  Gluteus Maximus Response Twitch response elicited                    PT Short Term Goals - 05/11/21 1013       PT SHORT TERM GOAL #1   Title Patient will demonstrate independent use of home exercise program to maintain progress from in clinic treatments.    Time 2    Period Weeks    Status Achieved    Target Date 05/11/21               PT Long Term Goals - 06/01/21 1036       PT LONG TERM GOAL #1   Title Patient will demonstrate/report pain at worst less than or equal to 2/10 to facilitate minimal limitation in daily activity secondary to pain symptoms.    Time 8    Period Weeks    Status On-going    Target Date 06/22/21      PT LONG TERM GOAL #2   Title Patient will demonstrate independent use of home exercise program to facilitate ability to maintain/progress functional gains from skilled physical therapy services.    Time 8    Period Weeks    Status On-going    Target Date 06/22/21      PT LONG TERM GOAL #3   Title Pt. will demonstrate lumbar ext 100 % WFL s symptoms to facilitate upright standing,walking at PLOF unrestricted.     Time 8    Period Weeks    Status New    Target Date 06/22/21      PT LONG TERM GOAL #4   Title Pt. will demonstrate FOTO outcome > or = 56 to indicated reduced disability due to condition.    Time 8    Period Weeks    Status On-going    Target Date 06/22/21      PT LONG TERM GOAL #5   Title Pt. will demonstrate/report ability to perform standing, sitting > 1 hour for household and church related activity at PLOF.    Time 8    Period Weeks    Status On-going    Target Date 06/22/21                   Plan - 06/01/21 1033     Clinical Impression Statement Pt. indicated localized symptoms from twitch response in Rt glute max c localized concordant symptos possible from proximal hamstring twitch response in Rt leg.    Personal Factors and Comorbidities Comorbidity 2    Comorbidities HTN, RA    Examination-Participation Restrictions Church;Community Activity;Yard Work    Stability/Clinical Decision Making Stable/Uncomplicated    Rehab Potential Good    PT Duration 8 weeks    PT Treatment/Interventions ADLs/Self Care Home Management;Cryotherapy;Electrical Stimulation;Iontophoresis 4mg /ml Dexamethasone;Moist Heat;Traction;Balance training;Therapeutic exercise;Therapeutic activities;Functional mobility training;Stair training;Gait training;DME Instruction;Ultrasound;Neuromuscular re-education;Patient/family education;Passive range of motion;Spinal Manipulations;Joint Manipulations;Dry needling;Taping;Manual techniques    PT Next Visit Plan Possible dry needling if desired/helpful. Continue hip and lumbar mobility gains and strengthening in functional tasks    PT [place] and Agree with Plan of Care Patient             Patient will benefit from skilled therapeutic intervention in order to improve the following deficits and impairments:  Decreased endurance, Hypomobility, Pain, Decreased activity tolerance, Impaired perceived functional  ability, Improper body mechanics, Postural dysfunction, Impaired flexibility, Decreased range of motion, Decreased mobility, Difficulty walking  Visit Diagnosis: Chronic  bilateral low back pain, unspecified whether sciatica present  Pain in left leg  Pain in right leg  Difficulty in walking, not elsewhere classified     Problem List There are no problems to display for this patient.   Scot Jun, PT, DPT, OCS, ATC 06/01/21  10:50 AM    [facility] [address] [place], [place], [address] Phone: [phone]   Fax:  [phone]  Name: Allison Whitaker MRN: [id number] Date of Birth: January 25, 1941

## 2021-06-06 ENCOUNTER — Ambulatory Visit: Payer: Medicare Other | Admitting: Physical Therapy

## 2021-06-06 ENCOUNTER — Other Ambulatory Visit: Payer: Self-pay

## 2021-06-06 DIAGNOSIS — M79604 Pain in right leg: Secondary | ICD-10-CM | POA: Diagnosis not present

## 2021-06-06 DIAGNOSIS — M79605 Pain in left leg: Secondary | ICD-10-CM | POA: Diagnosis not present

## 2021-06-06 DIAGNOSIS — R262 Difficulty in walking, not elsewhere classified: Secondary | ICD-10-CM

## 2021-06-06 DIAGNOSIS — G8929 Other chronic pain: Secondary | ICD-10-CM

## 2021-06-06 DIAGNOSIS — M545 Low back pain, unspecified: Secondary | ICD-10-CM | POA: Diagnosis not present

## 2021-06-06 DIAGNOSIS — R293 Abnormal posture: Secondary | ICD-10-CM

## 2021-06-06 NOTE — Therapy (Signed)
[facility] [address] [place], [place], [address] Phone: [phone]   Fax:  [phone]  Physical Therapy Treatment  Patient Details  Name: Allison Whitaker MRN: [id number] Date of Birth: 1941/03/06 Referring Provider (PT): Dr. Erlinda Hong   Encounter Date: 06/06/2021   PT End of Session - 06/06/21 1144     Visit Number 6    Number of Visits 12    Date for PT Re-Evaluation 06/22/21    Authorization Type UHC Medicare    Progress Note Due on Visit 10    PT Start Time 1100    PT Stop Time 1146    PT Time Calculation (min) 46 min    Activity Tolerance Patient tolerated treatment well    Behavior During Therapy [facility] for tasks assessed/performed             Past Medical History:  Diagnosis Date   Arthritis    Rheumatoid    GERD (gastroesophageal reflux disease)    Hypertension    Rheumatoid arthritis(714.0)    Thyroid disease     Past Surgical History:  Procedure Laterality Date   [place]  2009   for tendonitis    There were no vitals filed for this visit.   Subjective Assessment - 06/06/21 1107     Subjective Pt. indicated symptoms around 6/10 or so. She felt like the DN helped some with the pain down her leg but not as much with the pain in her posterior hip    Pertinent History HTN, RA, degenerative spine disease per MD note    Limitations Sitting;Standing;House hold activities    How long can you sit comfortably? about 1 hour    How long can you stand comfortably? 20-30 mins    Patient Stated Goals stand and walk for longer periods of time.    Pain Onset More than a month ago                               [facility] Adult PT Treatment/Exercise - 06/06/21 0001       Lumbar Exercises: Stretches   Single Knee to Chest Stretch 30 seconds;3 reps;Left;Right    Lower Trunk Rotation 3 reps;10 seconds    Figure 4 Stretch 3 reps;30 seconds      Lumbar Exercises: Aerobic   Recumbent Bike  L2 X6 min    Nustep --      Lumbar Exercises: Standing   Other Standing Lumbar Exercises lumbar extension x 10      Lumbar Exercises: Supine   Bridge 10 reps;5 seconds    Bridge Limitations 2 sets    Other Supine Lumbar Exercises clams blue 2X15      Manual Therapy   Manual therapy comments Rt long axis distraction 30 sec X6, Lt Long axis distraction 30 sec X3, STM and T.P. relase to glutes and piriformis bilat, manual quad stetching and manual hip IR/ER stretching                      PT Short Term Goals - 05/11/21 1013       PT SHORT TERM GOAL #1   Title Patient will demonstrate independent use of home exercise program to maintain progress from in clinic treatments.    Time 2    Period Weeks    Status Achieved    Target Date 05/11/21  PT Long Term Goals - 06/01/21 1036       PT LONG TERM GOAL #1   Title Patient will demonstrate/report pain at worst less than or equal to 2/10 to facilitate minimal limitation in daily activity secondary to pain symptoms.    Time 8    Period Weeks    Status On-going    Target Date 06/22/21      PT LONG TERM GOAL #2   Title Patient will demonstrate independent use of home exercise program to facilitate ability to maintain/progress functional gains from skilled physical therapy services.    Time 8    Period Weeks    Status On-going    Target Date 06/22/21      PT LONG TERM GOAL #3   Title Pt. will demonstrate lumbar ext 100 % WFL s symptoms to facilitate upright standing,walking at PLOF unrestricted.    Time 8    Period Weeks    Status New    Target Date 06/22/21      PT LONG TERM GOAL #4   Title Pt. will demonstrate FOTO outcome > or = 56 to indicated reduced disability due to condition.    Time 8    Period Weeks    Status On-going    Target Date 06/22/21      PT LONG TERM GOAL #5   Title Pt. will demonstrate/report ability to perform standing, sitting > 1 hour for household and church related  activity at PLOF.    Time 8    Period Weeks    Status On-going    Target Date 06/22/21                   Plan - 06/06/21 1146     Clinical Impression Statement She had a little less pain after session today but still with pain with her first few steps after sitting or laying for a while. She was shown how to march in place a few times upon standing to see if this helps with intial weight acceptance when she begins to walk. She will take 10 day trip to [place] [address] so we will assess how she is doing upon returning.    Personal Factors and Comorbidities Comorbidity 2    Comorbidities HTN, RA    Examination-Participation Restrictions Church;Community Activity;Yard Work    Stability/Clinical Decision Making Stable/Uncomplicated    Rehab Potential Good    PT Duration 8 weeks    PT Treatment/Interventions ADLs/Self Care Home Management;Cryotherapy;Electrical Stimulation;Iontophoresis 4mg /ml Dexamethasone;Moist Heat;Traction;Balance training;Therapeutic exercise;Therapeutic activities;Functional mobility training;Stair training;Gait training;DME Instruction;Ultrasound;Neuromuscular re-education;Patient/family education;Passive range of motion;Spinal Manipulations;Joint Manipulations;Dry needling;Taping;Manual techniques    PT Next Visit Plan Possible dry needling if desired/helpful. Continue hip and lumbar mobility gains and strengthening in functional tasks    PT [place] and Agree with Plan of Care Patient             Patient will benefit from skilled therapeutic intervention in order to improve the following deficits and impairments:  Decreased endurance, Hypomobility, Pain, Decreased activity tolerance, Impaired perceived functional ability, Improper body mechanics, Postural dysfunction, Impaired flexibility, Decreased range of motion, Decreased mobility, Difficulty walking  Visit Diagnosis: Chronic bilateral low back pain, unspecified  whether sciatica present  Pain in left leg  Pain in right leg  Difficulty in walking, not elsewhere classified  Abnormal posture     Problem List There are no problems to display for this patient.   Silvestre Mesi  06/06/2021, 11:48 AM  [facility] [address] [place], [place], [address] Phone: [phone]   Fax:  [phone]  Name: Allison Whitaker MRN: [id number] Date of Birth: [birth year]

## 2021-06-21 ENCOUNTER — Other Ambulatory Visit: Payer: Self-pay

## 2021-06-21 ENCOUNTER — Ambulatory Visit: Payer: Medicare Other | Admitting: Rehabilitative and Restorative Service Providers"

## 2021-06-21 ENCOUNTER — Encounter: Payer: Self-pay | Admitting: Rehabilitative and Restorative Service Providers"

## 2021-06-21 DIAGNOSIS — M79605 Pain in left leg: Secondary | ICD-10-CM | POA: Diagnosis not present

## 2021-06-21 DIAGNOSIS — M545 Low back pain, unspecified: Secondary | ICD-10-CM

## 2021-06-21 DIAGNOSIS — M79604 Pain in right leg: Secondary | ICD-10-CM

## 2021-06-21 DIAGNOSIS — R293 Abnormal posture: Secondary | ICD-10-CM

## 2021-06-21 DIAGNOSIS — R262 Difficulty in walking, not elsewhere classified: Secondary | ICD-10-CM | POA: Diagnosis not present

## 2021-06-21 DIAGNOSIS — G8929 Other chronic pain: Secondary | ICD-10-CM

## 2021-06-21 NOTE — Patient Instructions (Signed)
Access Code: [email or web address] URL: [email or web address] Date: 06/21/2021 Prepared by: Scot Jun  Exercises Supine Lower Trunk Rotation - 2 x daily - 7 x weekly - 5 reps - 1 sets - 15 hold Supine Hamstring Stretch with Strap - 2 x daily - 7 x weekly - 1 sets - 5 reps - 30 hold Supine Bridge - 2 x daily - 7 x weekly - 10 reps - 3 sets - 2 hold Clamshell - 2 x daily - 7 x weekly - 3 sets - 10 reps Supine Piriformis Stretch Pulling Heel to Hip - 2 x daily - 7 x weekly - 3 sets - 10 reps Prone Press Up - 2 x daily - 7 x weekly - 10 reps - 3 sets - 1-2 hold

## 2021-06-21 NOTE — Therapy (Signed)
[place], [place], [address] Phone: [phone]   Fax:  [phone]  Physical Therapy Treatment/Recertification  Patient Details  Name: Allison Whitaker MRN: [id number] Date of Birth: 1941/06/09 Referring Provider (PT): Dr. Erlinda Hong   Encounter Date: 06/21/2021  Progress Note Reporting Period 04/27/2021 to 06/21/2021  See note below for Objective Data and Assessment of Progress/Goals.        PT End of Session - 06/21/21 1005     Visit Number 7    Number of Visits 19    Date for PT Re-Evaluation 08/02/21    Authorization Type UHC Medicare    Progress Note Due on Visit 72    PT Start Time 1012    PT Stop Time 1052    PT Time Calculation (min) 40 min    Activity Tolerance Patient tolerated treatment well    Behavior During Therapy WFL for tasks assessed/performed             Past Medical History:  Diagnosis Date   Arthritis    Rheumatoid    GERD (gastroesophageal reflux disease)    Hypertension    Rheumatoid arthritis(714.0)    Thyroid disease     Past Surgical History:  Procedure Laterality Date   [place]  2009   for tendonitis    There were no vitals filed for this visit.   Subjective Assessment - 06/21/21 1011     Subjective Pt. indicated this week hasn't been good, noticing Lt leg pain complaints into lower side of leg.  Can't stand due to symptoms.  Sitting eases symptoms fairly quickly.   Pt. indicated she tried to so exercises while on vacation but didn't get it quite like normal.   Global Rating of Change since evaluation: a little bit better +2.    Pertinent History HTN, RA, degenerative spine disease per MD note    Limitations Sitting;Standing;House hold activities    How long can you sit comfortably? --    How long can you stand comfortably? --    Patient Stated Goals stand and walk for longer periods of time.    Currently in Pain? Yes    Pain Score 7     Pain  Location Leg    Pain Orientation Right;Left;Posterior    Pain Descriptors / Indicators Aching;Sore    Pain Type Chronic pain    Pain Onset More than a month ago    Pain Frequency Constant    Aggravating Factors  standing    Pain Relieving Factors sitting                [facility] PT Assessment - 06/21/21 0001       Assessment   Medical Diagnosis Low back pain    Referring Provider (PT) Dr. Erlinda Hong    Onset Date/Surgical Date 02/20/21    Hand Dominance Right      Observation/Other Assessments   Focus on Therapeutic Outcomes (FOTO)  56 update      AROM   Lumbar Flexion movement to floor, no change    Lumbar Extension 50 % c pulling back of legs, repeated x 5 : continued pulling pain complaints in back of legs - improved to 75% WFL      Palpation   Spinal mobility mild hypomobility L4, L5 - no radicular symptoms indicated      Special Tests   Other special tests (-) slump bilateral  [facility] Adult PT Treatment/Exercise - 06/21/21 0001       Lumbar Exercises: Aerobic   Recumbent Bike Lvl 3 9 mins      Lumbar Exercises: Prone   Other Prone Lumbar Exercises prone press up 2 x 10      Manual Therapy   Manual therapy comments G3 cPA L1-L5              Trigger Point Dry Needling - 06/21/21 0001     Consent Given? Yes    Education Handout Provided Previously provided                  PT Education - 06/21/21 1044     Education Details HEP review, encouraged follow up with MD    Person(s) Educated Patient    Methods Explanation;Demonstration;Verbal cues;Handout    Comprehension Verbalized understanding;Returned demonstration              PT Short Term Goals - 05/11/21 1013       PT SHORT TERM GOAL #1   Title Patient will demonstrate independent use of home exercise program to maintain progress from in clinic treatments.    Time 2    Period Weeks    Status Achieved    Target Date 05/11/21                PT Long Term Goals - 06/21/21 1043       PT LONG TERM GOAL #1   Title Patient will demonstrate/report pain at worst less than or equal to 2/10 to facilitate minimal limitation in daily activity secondary to pain symptoms.    Time 6    Period Weeks    Status Revised    Target Date 08/02/21      PT LONG TERM GOAL #2   Title Patient will demonstrate independent use of home exercise program to facilitate ability to maintain/progress functional gains from skilled physical therapy services.    Time 6    Period Weeks    Status Revised    Target Date 08/02/21      PT LONG TERM GOAL #3   Title Pt. will demonstrate lumbar ext 100 % WFL s symptoms to facilitate upright standing,walking at PLOF unrestricted.    Time 6    Period Weeks    Status Revised    Target Date 08/02/21      PT LONG TERM GOAL #4   Title Pt. will demonstrate FOTO outcome > or = 56 to indicated reduced disability due to condition.    Time 8    Period Weeks    Status Achieved      PT LONG TERM GOAL #5   Title Pt. will demonstrate/report ability to perform standing, sitting > 1 hour for household and church related activity at PLOF.    Time 6    Period Weeks    Status Revised    Target Date 08/02/21                   Plan - 06/21/21 1041     Clinical Impression Statement Pt. has attended 7 visits overall during course of treatment, reporting GROC at little bit better +2 at this time.  See objective data for updated information.  Pt. has demonstrated very mild gains in mobility and FOTO reassessment today.  Pt. has continued complaints of posterior hip/buttock, leg symptoms c primary difficulty noted in prolonged standing and walking activity.  Pt. has experienced periods of short term relief  at times from skilled PT services but still has functional limitations due to symptoms.  Recommend continued skilled PT services at this time c return to MD for follow up as well.    Personal Factors and  Comorbidities Comorbidity 2    Comorbidities HTN, RA    Examination-Participation Restrictions Church;Community Activity;Yard Work    Stability/Clinical Decision Making Stable/Uncomplicated    Rehab Potential Good    PT Duration 6 weeks    PT Treatment/Interventions ADLs/Self Care Home Management;Cryotherapy;Electrical Stimulation;Iontophoresis 4mg /ml Dexamethasone;Moist Heat;Traction;Balance training;Therapeutic exercise;Therapeutic activities;Functional mobility training;Stair training;Gait training;DME Instruction;Ultrasound;Neuromuscular re-education;Patient/family education;Passive range of motion;Spinal Manipulations;Joint Manipulations;Dry needling;Taping;Manual techniques    PT Next Visit Plan Reassess dry needling desire/benefits, lumbar extension mobility tolerance c prone press ups.  Continued to improve standing tolerance.    PT Home Exercise Plan [id number]    Consulted and Agree with Plan of Care Patient             Patient will benefit from skilled therapeutic intervention in order to improve the following deficits and impairments:  Decreased endurance, Hypomobility, Pain, Decreased activity tolerance, Impaired perceived functional ability, Improper body mechanics, Postural dysfunction, Impaired flexibility, Decreased range of motion, Decreased mobility, Difficulty walking  Visit Diagnosis: Chronic bilateral low back pain, unspecified whether sciatica present  Pain in left leg  Pain in right leg  Difficulty in walking, not elsewhere classified  Abnormal posture     Problem List There are no problems to display for this patient.  Scot Jun, PT, DPT, OCS, ATC 06/21/21  10:52 AM    [facility] [address] [place], [place], [address] Phone: [phone]   Fax:  [phone]  Name: CALDONIA LEAP MRN: [id number] Date of Birth: 08-21-1941

## 2021-06-27 ENCOUNTER — Encounter: Payer: Medicare Other | Admitting: Rehabilitative and Restorative Service Providers"

## 2021-07-02 ENCOUNTER — Other Ambulatory Visit: Payer: Self-pay

## 2021-07-02 ENCOUNTER — Encounter: Payer: Self-pay | Admitting: Rehabilitative and Restorative Service Providers"

## 2021-07-02 ENCOUNTER — Ambulatory Visit: Payer: Medicare Other | Admitting: Rehabilitative and Restorative Service Providers"

## 2021-07-02 DIAGNOSIS — M79605 Pain in left leg: Secondary | ICD-10-CM | POA: Diagnosis not present

## 2021-07-02 DIAGNOSIS — M545 Low back pain, unspecified: Secondary | ICD-10-CM | POA: Diagnosis not present

## 2021-07-02 DIAGNOSIS — G8929 Other chronic pain: Secondary | ICD-10-CM

## 2021-07-02 DIAGNOSIS — R262 Difficulty in walking, not elsewhere classified: Secondary | ICD-10-CM | POA: Diagnosis not present

## 2021-07-02 DIAGNOSIS — R293 Abnormal posture: Secondary | ICD-10-CM

## 2021-07-02 DIAGNOSIS — M79604 Pain in right leg: Secondary | ICD-10-CM

## 2021-07-02 NOTE — Therapy (Signed)
[facility] [address] [place], [place], [address] Phone: [phone]   Fax:  [phone]  Physical Therapy Treatment  Patient Details  Name: Allison Whitaker MRN: [id number] Date of Birth: 15-Sep-1941 Referring Provider (PT): Dr. Erlinda Hong   Encounter Date: 07/02/2021   PT End of Session - 07/02/21 1220     Visit Number 8    Number of Visits 19    Date for PT Re-Evaluation 08/02/21    Authorization Type UHC Medicare    Progress Note Due on Visit 17    PT Start Time 1140    PT Stop Time 1220    PT Time Calculation (min) 40 min    Activity Tolerance Patient tolerated treatment well    Behavior During Therapy WFL for tasks assessed/performed             Past Medical History:  Diagnosis Date   Arthritis    Rheumatoid    GERD (gastroesophageal reflux disease)    Hypertension    Rheumatoid arthritis(714.0)    Thyroid disease     Past Surgical History:  Procedure Laterality Date   [place]  2009   for tendonitis    There were no vitals filed for this visit.   Subjective Assessment - 07/02/21 1146     Subjective Pt. indicated feeling like she did have some improvement in pain areas after a few days past last visit.   Pt. indicated "not too much pain" today upon arrival.  Was hurting some this morning.  Pt. indicated good one day and not the next.    Pertinent History HTN, RA, degenerative spine disease per MD note    Limitations Sitting;Standing;House hold activities    Patient Stated Goals stand and walk for longer periods of time.    Currently in Pain? Yes    Pain Score 5     Pain Location Leg    Pain Orientation Right;Left;Posterior   Right side more   Pain Descriptors / Indicators Aching;Sore    Pain Type Chronic pain    Pain Onset More than a month ago    Pain Frequency Constant    Aggravating Factors  standing, walking, morning times    Pain Relieving Factors sitting rest, dry needling may have  helped.                [facility] PT Assessment - 07/02/21 0001       Assessment   Medical Diagnosis Low back pain    Referring Provider (PT) Dr. Erlinda Hong    Onset Date/Surgical Date 02/20/21    Hand Dominance Right      AROM   Lumbar Extension 75% c catch on Rt lumbar, pulling Rt leg/posterior hip.  100% WFL after 5 reps (no clear peripherial or centralization noted).  Extension standing after dry needling produced no pulling complaints.                           [facility] Adult PT Treatment/Exercise - 07/02/21 0001       Exercises   Other Exercises  HEP review, cues for techniques and usage of HEP for home care.      Lumbar Exercises: Aerobic   Recumbent Bike Lvl 2 10 mins      Lumbar Exercises: Standing   Other Standing Lumbar Exercises lumbar ext x 5      Manual Therapy   Manual therapy comments compression to Rt glute  max              Trigger Point Dry Needling - 07/02/21 0001     Consent Given? Yes    Education Handout Provided Previously provided    Muscles Treated Lower Quadrant --   Rt   Muscles Treated Back/Hip Gluteus maximus    Gluteus Maximus Response Twitch response elicited                    PT Short Term Goals - 05/11/21 1013       PT SHORT TERM GOAL #1   Title Patient will demonstrate independent use of home exercise program to maintain progress from in clinic treatments.    Time 2    Period Weeks    Status Achieved    Target Date 05/11/21               PT Long Term Goals - 06/21/21 1043       PT LONG TERM GOAL #1   Title Patient will demonstrate/report pain at worst less than or equal to 2/10 to facilitate minimal limitation in daily activity secondary to pain symptoms.    Time 6    Period Weeks    Status Revised    Target Date 08/02/21      PT LONG TERM GOAL #2   Title Patient will demonstrate independent use of home exercise program to facilitate ability to maintain/progress functional gains from skilled  physical therapy services.    Time 6    Period Weeks    Status Revised    Target Date 08/02/21      PT LONG TERM GOAL #3   Title Pt. will demonstrate lumbar ext 100 % WFL s symptoms to facilitate upright standing,walking at PLOF unrestricted.    Time 6    Period Weeks    Status Revised    Target Date 08/02/21      PT LONG TERM GOAL #4   Title Pt. will demonstrate FOTO outcome > or = 56 to indicated reduced disability due to condition.    Time 8    Period Weeks    Status Achieved      PT LONG TERM GOAL #5   Title Pt. will demonstrate/report ability to perform standing, sitting > 1 hour for household and church related activity at PLOF.    Time 6    Period Weeks    Status Revised    Target Date 08/02/21                   Plan - 07/02/21 1218     Clinical Impression Statement Review of movement today indicated possibility of lumbar extension movement improving symptoms paired c improvement noted from dry needling to Rt hip musculature. As noted today, extension complaints in Rt hip/thigh improved c repetitive extension movement and trigger point release techniques.  Pt. to return to MD office and will follow up with therapy afterwards for evaluaiton of plan of care.    Personal Factors and Comorbidities Comorbidity 2    Comorbidities HTN, RA    Examination-Participation Restrictions Church;Community Activity;Yard Work    Stability/Clinical Decision Making Stable/Uncomplicated    Rehab Potential Good    PT Duration 6 weeks    PT Treatment/Interventions ADLs/Self Care Home Management;Cryotherapy;Electrical Stimulation;Iontophoresis 4mg /ml Dexamethasone;Moist Heat;Traction;Balance training;Therapeutic exercise;Therapeutic activities;Functional mobility training;Stair training;Gait training;DME Instruction;Ultrasound;Neuromuscular re-education;Patient/family education;Passive range of motion;Spinal Manipulations;Joint Manipulations;Dry needling;Taping;Manual techniques    PT  Next Visit Plan Reassessment for plan of care changes  after MD visit.    PT Home Exercise Plan [email or web address]    Consulted and Agree with Plan of Care Patient             Patient will benefit from skilled therapeutic intervention in order to improve the following deficits and impairments:  Decreased endurance, Hypomobility, Pain, Decreased activity tolerance, Impaired perceived functional ability, Improper body mechanics, Postural dysfunction, Impaired flexibility, Decreased range of motion, Decreased mobility, Difficulty walking  Visit Diagnosis: Chronic bilateral low back pain, unspecified whether sciatica present  Pain in left leg  Pain in right leg  Difficulty in walking, not elsewhere classified  Abnormal posture     Problem List There are no problems to display for this patient.  Scot Jun, PT, DPT, OCS, ATC 07/02/21  12:21 PM    [facility] [address] [place], [place], [address] Phone: [phone]   Fax:  [phone]  Name: LELAR FAREWELL MRN: [id number] Date of Birth: [birth year]

## 2021-07-04 ENCOUNTER — Other Ambulatory Visit: Payer: Self-pay

## 2021-07-04 ENCOUNTER — Ambulatory Visit: Payer: Medicare Other | Admitting: Orthopaedic Surgery

## 2021-07-04 ENCOUNTER — Encounter: Payer: Self-pay | Admitting: Orthopaedic Surgery

## 2021-07-04 DIAGNOSIS — G8929 Other chronic pain: Secondary | ICD-10-CM

## 2021-07-04 DIAGNOSIS — M5416 Radiculopathy, lumbar region: Secondary | ICD-10-CM

## 2021-07-04 NOTE — Progress Notes (Signed)
   Office Visit Note   Patient: Allison Whitaker           Date of Birth: 05-07-1941           MRN: [id number] Visit Date: 07/04/2021              Requested by: Burnard Bunting, MD [address] [place],  [place] [address] PCP: Burnard Bunting, MD   Assessment & Plan: Visit Diagnoses:  1. Radiculopathy, lumbar region     Plan: Impression is chronic right lower extremity radiculopathy.  At this point, the patient has tried steroids as well as a long course of physical therapy without much relief.  We have discussed obtaining an MRI of the lumbar spine to further evaluate structural abnormalities.  She will follow-up with [place] once this is been completed.  Call with concerns or questions in the meantime.  Follow-Up Instructions: Return for after MRI.   Orders:  No orders of the defined types were placed in this encounter.  No orders of the defined types were placed in this encounter.     Procedures: No procedures performed   Clinical Data: No additional findings.   Subjective: Chief Complaint  Patient presents with   Lower Back - Pain, Follow-up    HPI patient is a pleasant 80 year old female who comes in today for follow-up of her right lower back pain and right lower extremity radiculopathy.  She was seen in our office approximately 3 months ago where she was started on a steroid taper and sent to physical therapy.  She notes that she has made very little improvement with therapy.  She continues to have pain and paresthesias down the right lower extremity.  There are no specific aggravators, however she notes that this appears to be worse at night.     Objective: Vital Signs: There were no vitals taken for this visit.    Ortho Exam lumbar spine exam shows no spinous or paraspinous tenderness.  She has slight pain with lumbar extension.  Negative straight leg raise.  No focal weakness.  She is neurovascular intact distally.  Specialty Comments:  No specialty comments  available.  Imaging: No new imaging   PMFS History: There are no problems to display for this patient.  Past Medical History:  Diagnosis Date   Arthritis    Rheumatoid    GERD (gastroesophageal reflux disease)    Hypertension    Rheumatoid arthritis(714.0)    Thyroid disease     Family History  Problem Relation Age of Onset   Stroke Mother    Colon cancer Neg Hx     Past Surgical History:  Procedure Laterality Date   OOPHORECTOMY  41   WRIST SURGERY  2009   for tendonitis   Social History   Occupational History   Not on file  Tobacco Use   Smoking status: Never   Smokeless tobacco: Never  Substance and Sexual Activity   Alcohol use: Yes    Alcohol/week: 2.0 standard drinks    Types: 2 Glasses of wine per week   Drug use: No   Sexual activity: Not on file

## 2021-07-05 ENCOUNTER — Ambulatory Visit: Payer: Medicare Other | Admitting: Rehabilitative and Restorative Service Providers"

## 2021-07-05 ENCOUNTER — Encounter: Payer: Self-pay | Admitting: Rehabilitative and Restorative Service Providers"

## 2021-07-05 DIAGNOSIS — G8929 Other chronic pain: Secondary | ICD-10-CM

## 2021-07-05 DIAGNOSIS — M79604 Pain in right leg: Secondary | ICD-10-CM

## 2021-07-05 DIAGNOSIS — M79605 Pain in left leg: Secondary | ICD-10-CM | POA: Diagnosis not present

## 2021-07-05 DIAGNOSIS — R293 Abnormal posture: Secondary | ICD-10-CM

## 2021-07-05 DIAGNOSIS — R262 Difficulty in walking, not elsewhere classified: Secondary | ICD-10-CM | POA: Diagnosis not present

## 2021-07-05 DIAGNOSIS — M545 Low back pain, unspecified: Secondary | ICD-10-CM

## 2021-07-05 NOTE — Therapy (Addendum)
[facility] [address] [place], [place], [address] Phone: [phone]   Fax:  [phone]  Physical Therapy Treatment /Discharge  Patient Details  Name: Allison Whitaker MRN: [id number] Date of Birth: 1941/04/06 Referring Provider (PT): Dr. Erlinda Hong   Encounter Date: 07/05/2021   PT End of Session - 07/05/21 1111     Visit Number 9    Number of Visits 19    Date for PT Re-Evaluation 08/02/21    Authorization Type [employer] Medicare    Progress Note Due on Visit 17    PT Start Time 1058    PT Stop Time 1128    PT Time Calculation (min) 30 min    Activity Tolerance Patient tolerated treatment well    Behavior During Therapy WFL for tasks assessed/performed             Past Medical History:  Diagnosis Date   Arthritis    Rheumatoid    GERD (gastroesophageal reflux disease)    Hypertension    Rheumatoid arthritis(714.0)    Thyroid disease     Past Surgical History:  Procedure Laterality Date   [place]  2009   for tendonitis    There were no vitals filed for this visit.   Subjective Assessment - 07/05/21 1100     Subjective Pt. indicated she feels like her butt complaints are improving in last few days and visits.  Pt. indicated MD office visit resulting in MRI request (uncertain if she can hav MRI based off another thing).  Pt. indicated some variable symptoms c upright movements.    Pertinent History HTN, RA, degenerative spine disease per MD note    Limitations Sitting;Standing;House hold activities    Patient Stated Goals stand and walk for longer periods of time.    Currently in Pain? Yes    Pain Score 4     Pain Location Leg    Pain Orientation Right;Posterior    Pain Descriptors / Indicators Aching;Sore    Pain Onset More than a month ago                [facility] PT Assessment - 07/05/21 0001       Assessment   Medical Diagnosis Low back pain    Referring Provider (PT) Dr. Erlinda Hong    Onset  Date/Surgical Date 02/20/21    Hand Dominance Right      Observation/Other Assessments   Focus on Therapeutic Outcomes (FOTO)  57 update      AROM   Lumbar Extension 100 % c mild pull in Rt leg.  Repeated in standing x 5: improved symptoms noted      Palpation   Spinal mobility Mild restriction L5 cPA, improved above compared to previous                           [facility] Adult PT Treatment/Exercise - 07/05/21 0001       Lumbar Exercises: Aerobic   Nustep Lvl 6 10 mins      Manual Therapy   Manual therapy comments compression to Rt glute max, prone cPA L2-L5 g3              Trigger Point Dry Needling - 07/05/21 0001     Consent Given? Yes    Education Handout Provided Previously provided    Muscles Treated Back/Hip Gluteus maximus    Gluteus Maximus Response Twitch response elicited  PT Short Term Goals - 05/11/21 1013       PT SHORT TERM GOAL #1   Title Patient will demonstrate independent use of home exercise program to maintain progress from in clinic treatments.    Time 2    Period Weeks    Status Achieved    Target Date 05/11/21               PT Long Term Goals - 07/05/21 1127       PT LONG TERM GOAL #1   Title Patient will demonstrate/report pain at worst less than or equal to 2/10 to facilitate minimal limitation in daily activity secondary to pain symptoms.    Time 6    Period Weeks    Status On-going    Target Date 08/02/21      PT LONG TERM GOAL #2   Title Patient will demonstrate independent use of home exercise program to facilitate ability to maintain/progress functional gains from skilled physical therapy services.    Time 6    Period Weeks    Status On-going    Target Date 08/02/21      PT LONG TERM GOAL #3   Title Pt. will demonstrate lumbar ext 100 % WFL s symptoms to facilitate upright standing,walking at PLOF unrestricted.    Time 6    Period Weeks    Status Achieved      PT LONG  TERM GOAL #4   Title Pt. will demonstrate FOTO outcome > or = 56 to indicated reduced disability due to condition.    Time 8    Period Weeks    Status Achieved      PT LONG TERM GOAL #5   Title Pt. will demonstrate/report ability to perform standing, sitting > 1 hour for household and church related activity at PLOF.    Time 6    Period Weeks    Status On-going    Target Date 08/02/21                   Plan - 07/05/21 1123     Clinical Impression Statement Reassessment of objective data has shown a continued trajectory of improvement slow and steady in regards to FOTO and measurements as noted.  Pt. has recently noted more improvement in last few visit vs. some previous.    Personal Factors and Comorbidities Comorbidity 2    Comorbidities HTN, RA    Examination-Participation Restrictions Church;Community Activity;Yard Work    Stability/Clinical Decision Making Stable/Uncomplicated    Rehab Potential Good    PT Duration 6 weeks    PT Treatment/Interventions ADLs/Self Care Home Management;Cryotherapy;Electrical Stimulation;Iontophoresis 52m/ml Dexamethasone;Moist Heat;Traction;Balance training;Therapeutic exercise;Therapeutic activities;Functional mobility training;Stair training;Gait training;DME Instruction;Ultrasound;Neuromuscular re-education;Patient/family education;Passive range of motion;Spinal Manipulations;Joint Manipulations;Dry needling;Taping;Manual techniques    PT Next Visit Plan Continuation per Patient, dry needling, manual for improved lumbar and hip mobility    PT Home Exercise Plan [id number]   Consulted and Agree with Plan of Care Patient             Patient will benefit from skilled therapeutic intervention in order to improve the following deficits and impairments:  Decreased endurance, Hypomobility, Pain, Decreased activity tolerance, Impaired perceived functional ability, Improper body mechanics, Postural dysfunction, Impaired flexibility, Decreased  range of motion, Decreased mobility, Difficulty walking  Visit Diagnosis: Chronic bilateral low back pain, unspecified whether sciatica present  Pain in left leg  Pain in right leg  Difficulty in walking, not elsewhere classified  Abnormal posture  Problem List There are no problems to display for this patient.   Scot Jun, PT, DPT, OCS, ATC 07/05/21  11:31 AM  PHYSICAL THERAPY DISCHARGE SUMMARY  Visits from Start of Care: 9  Current functional level related to goals / functional outcomes: See note   Remaining deficits: See note   Education / Equipment: HEP   Patient agrees to discharge. Patient goals were partially met. Patient is being discharged due to not returning since the last visit.   [facility] Physical Therapy [address] [place], [place], [address] Phone: [phone]   Fax:  [phone]  Name: Allison Whitaker MRN: [id number] Date of Birth: 09/16/1941

## 2021-07-10 ENCOUNTER — Telehealth: Payer: Self-pay | Admitting: Radiology

## 2021-07-10 NOTE — Telephone Encounter (Signed)
[facility] called. This patient has an E Coin implant and they are unable to do the MRI that was ordered.

## 2021-07-10 NOTE — Telephone Encounter (Signed)
Let's do CT myelogram

## 2021-07-11 ENCOUNTER — Other Ambulatory Visit: Payer: Self-pay

## 2021-07-11 ENCOUNTER — Telehealth: Payer: Self-pay

## 2021-07-11 DIAGNOSIS — M5441 Lumbago with sciatica, right side: Secondary | ICD-10-CM

## 2021-07-11 DIAGNOSIS — G8929 Other chronic pain: Secondary | ICD-10-CM

## 2021-07-11 NOTE — Progress Notes (Signed)
Phone call to patient to verify medication list and allergies for myelogram procedure. Pt aware she will not need to hold any medications for this procedure. Pre and post procedure instructions reviewed with pt. Pt also instructed to have a driver the day of his myelogram procedure. Pt verbalized understanding.

## 2021-07-11 NOTE — Telephone Encounter (Signed)
ORDER MADE 

## 2021-07-13 ENCOUNTER — Ambulatory Visit
Admission: RE | Admit: 2021-07-13 | Discharge: 2021-07-13 | Disposition: A | Discharge status: Home / Self Care | Payer: Medicare Other | Source: Ambulatory Visit | Attending: Orthopaedic Surgery | Admitting: Orthopaedic Surgery

## 2021-07-13 ENCOUNTER — Other Ambulatory Visit: Payer: Self-pay

## 2021-07-13 ENCOUNTER — Other Ambulatory Visit: Payer: Self-pay | Admitting: Orthopaedic Surgery

## 2021-07-13 DIAGNOSIS — M5442 Lumbago with sciatica, left side: Secondary | ICD-10-CM

## 2021-07-13 DIAGNOSIS — G8929 Other chronic pain: Secondary | ICD-10-CM

## 2021-07-13 DIAGNOSIS — M5441 Lumbago with sciatica, right side: Secondary | ICD-10-CM

## 2021-07-13 MED ORDER — IOPAMIDOL (ISOVUE-M 200) INJECTION 41%
20.0000 mL | Freq: Once | INTRAMUSCULAR | Status: AC
  Administered 2021-07-13: 20 mL via INTRATHECAL

## 2021-07-13 MED ORDER — ONDANSETRON HCL 4 MG/2ML IJ SOLN
4.0000 mg | Freq: Once | INTRAMUSCULAR | Status: AC
  Administered 2021-07-13: 4 mg via INTRAMUSCULAR

## 2021-07-13 MED ORDER — MEPERIDINE HCL 50 MG/ML IJ SOLN
50.0000 mg | Freq: Once | INTRAMUSCULAR | Status: AC
Start: 2021-07-13 — End: 2021-07-13
  Administered 2021-07-13: 50 mg via INTRAMUSCULAR

## 2021-07-13 MED ORDER — DIAZEPAM 5 MG PO TABS
5.0000 mg | ORAL_TABLET | Freq: Once | ORAL | Status: AC
  Administered 2021-07-13: 5 mg via ORAL

## 2021-07-13 NOTE — Discharge Instructions (Signed)

## 2021-07-13 NOTE — Discharge Instr - Other Orders (Addendum)
1335: pt c/o pain 6/10 pain in BLE and back from myelogram procedure. See MAR.  [id number]: pt reports relief from pain

## 2021-07-20 ENCOUNTER — Other Ambulatory Visit: Payer: Self-pay

## 2021-07-20 ENCOUNTER — Encounter: Payer: Self-pay | Admitting: Orthopaedic Surgery

## 2021-07-20 ENCOUNTER — Ambulatory Visit: Payer: Medicare Other | Admitting: Orthopaedic Surgery

## 2021-07-20 DIAGNOSIS — M48061 Spinal stenosis, lumbar region without neurogenic claudication: Secondary | ICD-10-CM | POA: Diagnosis not present

## 2021-07-20 NOTE — Progress Notes (Signed)
   Office Visit Note   Patient: Allison Whitaker           Date of Birth: 1941-07-28           MRN: [id number] Visit Date: 07/20/2021              Requested by: Burnard Bunting, MD [address] [place],  [place] [address] PCP: Burnard Bunting, MD   Assessment & Plan: Visit Diagnoses:  1. Spinal stenosis of lumbar region, unspecified whether neurogenic claudication present     Plan: Impression is mild disc bulge at L1-2 with right lateral recess encroachment, moderate spinal stenosis with bilateral lateral recess and right foraminal stenosis L2-3, mild spinal stenosis L3-4, severe spinal stenosis L4-5 with bilateral foraminal encroachment and left paracentral disc protrusion L5-S1.  At this point, the patient has failed steroid medication as well as physical therapy.  We will go ahead and make referral to Dr. Ernestina Patches for epidural steroid injection.  She will follow-up with [place] as needed.  Call with concerns or questions in the meantime.   Follow-Up Instructions: Return if symptoms worsen or fail to improve.   Orders:  No orders of the defined types were placed in this encounter.  No orders of the defined types were placed in this encounter.     Procedures: No procedures performed   Clinical Data: No additional findings.   Subjective: Chief Complaint  Patient presents with   Lower Back - Pain    HPI patient is a pleasant 80 year old female who comes in today to discuss CT results of the lumbar spine.  We have been seeing her for chronic low back pain and right lower extremity radiculopathy over the past several months.  She has been into physical therapy and has tried steroids without significant relief.  Recent CT scan from 07/13/2021 shows a mild disc bulge at L1-2 with right lateral recess encroachment, moderate spinal stenosis with bilateral lateral recess and right foraminal stenosis L2-3, mild spinal stenosis L3-4, severe spinal stenosis L4-5 with bilateral foraminal  encroachment and left paracentral disc protrusion L5-S1.     Objective: Vital Signs: There were no vitals taken for this visit.    Ortho Exam unchanged lumbar spine exam  Specialty Comments:  No specialty comments available.  Imaging: No new imaging   PMFS History: There are no problems to display for this patient.  Past Medical History:  Diagnosis Date   Arthritis    Rheumatoid    GERD (gastroesophageal reflux disease)    Hypertension    Rheumatoid arthritis(714.0)    Thyroid disease     Family History  Problem Relation Age of Onset   Stroke Mother    Colon cancer Neg Hx     Past Surgical History:  Procedure Laterality Date   OOPHORECTOMY  66   WRIST SURGERY  2009   for tendonitis   Social History   Occupational History   Not on file  Tobacco Use   Smoking status: Never   Smokeless tobacco: Never  Substance and Sexual Activity   Alcohol use: Yes    Alcohol/week: 2.0 standard drinks    Types: 2 Glasses of wine per week   Drug use: No   Sexual activity: Not on file

## 2021-07-23 ENCOUNTER — Telehealth: Payer: Self-pay | Admitting: Physical Medicine and Rehabilitation

## 2021-07-23 NOTE — Telephone Encounter (Signed)
Pt returned call to [facility] about referral. Call pt to set an appt. Pt phone number is [phone].

## 2021-08-06 ENCOUNTER — Ambulatory Visit: Payer: Self-pay

## 2021-08-06 ENCOUNTER — Encounter: Payer: Self-pay | Admitting: Physical Medicine and Rehabilitation

## 2021-08-06 ENCOUNTER — Ambulatory Visit (INDEPENDENT_AMBULATORY_CARE_PROVIDER_SITE_OTHER): Payer: Medicare Other | Admitting: Physical Medicine and Rehabilitation

## 2021-08-06 ENCOUNTER — Other Ambulatory Visit: Payer: Self-pay

## 2021-08-06 VITALS — BP 139/82 | HR 80

## 2021-08-06 DIAGNOSIS — M5416 Radiculopathy, lumbar region: Secondary | ICD-10-CM | POA: Diagnosis not present

## 2021-08-06 MED ORDER — BETAMETHASONE SOD PHOS & ACET 6 (3-3) MG/ML IJ SUSP
12.0000 mg | Freq: Once | INTRAMUSCULAR | Status: AC
Start: 2021-08-06 — End: 2021-08-06
  Administered 2021-08-06: 12 mg

## 2021-08-06 NOTE — Procedures (Signed)
Lumbar Epidural Steroid Injection - Interlaminar Approach with Fluoroscopic Guidance  Patient: Allison Whitaker      Date of Birth: 01-30-1941 MRN: [id number] PCP: Burnard Bunting, MD      Visit Date: 08/06/2021   Universal Protocol:     Consent Given By: the patient  Position: PRONE  Additional Comments: Vital signs were monitored before and after the procedure. Patient was prepped and draped in the usual sterile fashion. The correct patient, procedure, and site was verified.   Injection Procedure Details:   Procedure diagnoses: Lumbar radiculopathy [M54.16]   Meds Administered:  Meds ordered this encounter  Medications   betamethasone acetate-betamethasone sodium phosphate (CELESTONE) injection 12 mg     Laterality: Right  Location/Site:  L5-S1  Needle: 3.5 in., 20 ga. Tuohy  Needle Placement: Paramedian epidural  Findings:   -Comments: Excellent flow of contrast into the epidural space.  Procedure Details: Using a paramedian approach from the side mentioned above, the region overlying the inferior lamina was localized under fluoroscopic visualization and the soft tissues overlying this structure were infiltrated with 4 ml. of 1% Lidocaine without Epinephrine. The Tuohy needle was inserted into the epidural space using a paramedian approach.   The epidural space was localized using loss of resistance along with counter oblique bi-planar fluoroscopic views.  After negative aspirate for air, blood, and CSF, a 2 ml. volume of Isovue-250 was injected into the epidural space and the flow of contrast was observed. Radiographs were obtained for documentation purposes.    The injectate was administered into the level noted above.   Additional Comments:  The patient tolerated the procedure well Dressing: 2 x 2 sterile gauze and Band-Aid    Post-procedure details: Patient was observed during the procedure. Post-procedure instructions were reviewed.  Patient left the  clinic in stable condition.

## 2021-08-06 NOTE — Progress Notes (Signed)
DEEANNA Whitaker - 80 y.o. female MRN [id number]  Date of birth: [birth year]  Office Visit Note: Visit Date: 08/06/2021 PCP: Burnard Bunting, MD Referred by: Burnard Bunting, MD  Subjective: Chief Complaint  Patient presents with   Lower Back - Pain   HPI:  Allison Whitaker is a 80 y.o. female who comes in today at the request of Dr. Eduard Roux for planned Right L5-S1 Lumbar Interlaminar epidural steroid injection with fluoroscopic guidance.  The patient has failed conservative care including home exercise, medications, time and activity modification.  This injection will be diagnostic and hopefully therapeutic.  Please see requesting physician notes for further details and justification.  Lumbar CT myelogram reviewed with spine images and modeling.  Report reviewed below.  Patient does have pretty significant stenosis at L4-5.  Depending on relief with transforaminal approach at the level of stenosis.   ROS Otherwise per HPI.  Assessment & Plan: Visit Diagnoses:    ICD-10-CM   1. Lumbar radiculopathy  M54.16 XR C-ARM NO REPORT    Epidural Steroid injection    betamethasone acetate-betamethasone sodium phosphate (CELESTONE) injection 12 mg      Plan: No additional findings.   Meds & Orders:  Meds ordered this encounter  Medications   betamethasone acetate-betamethasone sodium phosphate (CELESTONE) injection 12 mg    Orders Placed This Encounter  Procedures   XR C-ARM NO REPORT   Epidural Steroid injection    Follow-up: Return if symptoms worsen or fail to improve.   Procedures: No procedures performed  Lumbar Epidural Steroid Injection - Interlaminar Approach with Fluoroscopic Guidance  Patient: Allison Whitaker      Date of Birth: [birth year] MRN: [id number] PCP: Burnard Bunting, MD      Visit Date: 08/06/2021   Universal Protocol:     Consent Given By: the patient  Position: PRONE  Additional Comments: Vital signs were monitored before and after the  procedure. Patient was prepped and draped in the usual sterile fashion. The correct patient, procedure, and site was verified.   Injection Procedure Details:   Procedure diagnoses: Lumbar radiculopathy [M54.16]   Meds Administered:  Meds ordered this encounter  Medications   betamethasone acetate-betamethasone sodium phosphate (CELESTONE) injection 12 mg     Laterality: Right  Location/Site:  L5-S1  Needle: 3.5 in., 20 ga. Tuohy  Needle Placement: Paramedian epidural  Findings:   -Comments: Excellent flow of contrast into the epidural space.  Procedure Details: Using a paramedian approach from the side mentioned above, the region overlying the inferior lamina was localized under fluoroscopic visualization and the soft tissues overlying this structure were infiltrated with 4 ml. of 1% Lidocaine without Epinephrine. The Tuohy needle was inserted into the epidural space using a paramedian approach.   The epidural space was localized using loss of resistance along with counter oblique bi-planar fluoroscopic views.  After negative aspirate for air, blood, and CSF, a 2 ml. volume of Isovue-250 was injected into the epidural space and the flow of contrast was observed. Radiographs were obtained for documentation purposes.    The injectate was administered into the level noted above.   Additional Comments:  The patient tolerated the procedure well Dressing: 2 x 2 sterile gauze and Band-Aid    Post-procedure details: Patient was observed during the procedure. Post-procedure instructions were reviewed.  Patient left the clinic in stable condition.   Clinical History: CT LUMBAR SPINE WITH INTRATHECAL CONTRAST     FINDINGS: 5 non rib-bearing lumbar segments labeled L1-L5. Benign vertebral  hemangiomas in the L1, L2, and L4 vertebral bodies. Negative for fracture.   T12-L1: Interspace unremarkable. Central canal and foramina patent.   L1-2: Conus terminates at the  interspace. Mild circumferential calcified disc bulge. Mild right lateral recess encroachment. No significant spinal or foraminal stenosis.   L2-3: Mild narrowing of the interspace on the right. Moderate calcified posterior disc bulge right worse than left. There is mild facet DJD left worse than right and some thickening of the ligamentum flavum contributing to moderate central canal stenosis, right foraminal stenosis, and bilateral lateral recess stenosis.   L3-4: Calcified circumferential disc bulge. 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   L4-5: Advanced bilateral facet DJD with grade 1 anterolisthesis. Mild posterior calcified disc bulge contributes to severe multifactorial spinal stenosis and bilateral foraminal encroachment.   L5-S1: Small left paracentral partially calcified protrusion approaches the left S1 nerve root. No spinal or foraminal stenosis.   Moderate calcified aortoiliac atherosclerosis (ICD10-170.0). 7 mm lower pole left renal calculus without hydronephrosis. Remainder visualized paraspinal soft tissues unremarkable.   IMPRESSION: 1. Mild disc bulge L1-2 with right lateral recess encroachment. 2. 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 3. Mild multifactorial spinal stenosis L3-4 with early foraminal encroachment bilaterally. 4. Severe multifactorial spinal stenosis and grade 1 anterolisthesis L4-5 with bilateral foraminal encroachment. 5. Left paracentral protrusion L5-S1 approaches the left S1 nerve root without compression. 6. Nonobstructive left nephrolithiasis.     Electronically Signed   By: Lucrezia Europe M.D.   On: 07/13/2021 14:27     Objective:  VS:  HT:    WT:   BMI:     BP:139/82  HR:80bpm  TEMP: ( )  RESP:  Physical  Exam Vitals and nursing note reviewed.  Constitutional:      General: She is not in acute distress.    Appearance: Normal appearance. She is not ill-appearing.  HENT:     Head: Normocephalic and atraumatic.     Right Ear: External ear normal.     Left Ear: External ear normal.  Eyes:     Extraocular Movements: Extraocular movements intact.  Cardiovascular:     Rate and Rhythm: Normal rate.     Pulses: Normal pulses.  Pulmonary:     Effort: Pulmonary effort is normal. No respiratory distress.  Abdominal:     General: There is no distension.     Palpations: Abdomen is soft.  Musculoskeletal:        General: Tenderness present.     Cervical back: Neck supple.     Right lower leg: No edema.     Left lower leg: No edema.     Comments: Patient has good distal strength with no pain over the greater trochanters.  No clonus or focal weakness.  Skin:    Findings: No erythema, lesion or rash.  Neurological:     General: No focal deficit present.     Mental Status: She is alert and oriented to person, place, and time.     Sensory: No sensory deficit.     Motor: No weakness or abnormal muscle tone.     Coordination: Coordination normal.  Psychiatric:        Mood and Affect: Mood normal.        Behavior: Behavior normal.     Imaging: No results found.

## 2021-08-06 NOTE — Progress Notes (Signed)
Pt state lower back pain that travel to right buttock area. Pt state walking, standing and laying down makes the pain worse. Pt state she uses heating, bio freeze gel and sit to rest. Pt state she takes pain meds to help ease her pain.  Numeric Pain Rating Scale and Functional Assessment Average Pain 6   In the last MONTH (on 0-10 scale) has pain interfered with the following?  1. General activity like being  able to carry out your everyday physical activities such as walking, climbing stairs, carrying groceries, or moving a chair?  Rating(8)   +Driver, -BT, -Dye Allergies.

## 2021-08-06 NOTE — Patient Instructions (Signed)

## 2021-08-21 ENCOUNTER — Telehealth: Payer: Self-pay

## 2021-08-21 NOTE — Telephone Encounter (Signed)
Pt called and would like to schedule another appt with Dr, Ernestina Patches for an injection. The last injection pt stated didn't help. Please advise

## 2021-09-06 ENCOUNTER — Encounter: Payer: Self-pay | Admitting: Physical Medicine and Rehabilitation

## 2021-09-06 ENCOUNTER — Ambulatory Visit: Payer: Medicare Other | Admitting: Physical Medicine and Rehabilitation

## 2021-09-06 ENCOUNTER — Ambulatory Visit: Payer: Self-pay

## 2021-09-06 ENCOUNTER — Other Ambulatory Visit: Payer: Self-pay

## 2021-09-06 VITALS — BP 148/83 | HR 88

## 2021-09-06 DIAGNOSIS — M5416 Radiculopathy, lumbar region: Secondary | ICD-10-CM | POA: Diagnosis not present

## 2021-09-06 MED ORDER — METHYLPREDNISOLONE ACETATE 80 MG/ML IJ SUSP
80.0000 mg | Freq: Once | INTRAMUSCULAR | Status: AC
Start: 2021-09-06 — End: 2021-09-06
  Administered 2021-09-06: 80 mg

## 2021-09-06 NOTE — Progress Notes (Signed)
Pt state lower back pain that travels to her left hip and groin area. Pt state sometime it makes her right leg weak. Pt state walking and standing for a long period of time makes the pain worse. Pt state she takes pain meds to help ease her pain. Pt hash hx of inj on 08/06/21 pt state it helped  the lower back and right hip but started to get pain in her groin area.  Numeric Pain Rating Scale and Functional Assessment Average Pain 5   In the last MONTH (on 0-10 scale) has pain interfered with the following?  1. General activity like being  able to carry out your everyday physical activities such as walking, climbing stairs, carrying groceries, or moving a chair?  Rating(8)   +Driver, -BT, -Dye Allergies.

## 2021-09-09 NOTE — Procedures (Signed)
Lumbosacral Transforaminal Epidural Steroid Injection - Sub-Pedicular Approach with Fluoroscopic Guidance  Patient: Allison Whitaker      Date of Birth: [birth year] MRN: [id number] PCP: Burnard Bunting, MD      Visit Date: 09/06/2021   Universal Protocol:    Date/Time: 09/06/2021  Consent Given By: the patient  Position: PRONE  Additional Comments: Vital signs were monitored before and after the procedure. Patient was prepped and draped in the usual sterile fashion. The correct patient, procedure, and site was verified.   Injection Procedure Details:   Procedure diagnoses: Lumbar radiculopathy [M54.16]    Meds Administered:  Meds ordered this encounter  Medications   methylPREDNISolone acetate (DEPO-MEDROL) injection 80 mg    Laterality: Bilateral  Location/Site: L4  Needle:5.0 in., 22 ga.  Short bevel or Quincke spinal needle  Needle Placement: Transforaminal  Findings:    -Comments: Excellent flow of contrast along the nerve, nerve root and into the epidural space.  Procedure Details: After squaring off the end-plates to get a true AP view, the C-arm was positioned so that an oblique view of the foramen as noted above was visualized. The target area is just inferior to the "nose of the scotty dog" or sub pedicular. The soft tissues overlying this structure were infiltrated with 2-3 ml. of 1% Lidocaine without Epinephrine.  The spinal needle was inserted toward the target using a "trajectory" view along the fluoroscope beam.  Under AP and lateral visualization, the needle was advanced so it did not puncture dura and was located close the 6 O'Clock position of the pedical in AP tracterory. Biplanar projections were used to confirm position. Aspiration was confirmed to be negative for CSF and/or blood. A 1-2 ml. volume of Isovue-250 was injected and flow of contrast was noted at each level. Radiographs were obtained for documentation purposes.   After attaining the desired  flow of contrast documented above, a 0.5 to 1.0 ml test dose of 0.25% Marcaine was injected into each respective transforaminal space.  The patient was observed for 90 seconds post injection.  After no sensory deficits were reported, and normal lower extremity motor function was noted,   the above injectate was administered so that equal amounts of the injectate were placed at each foramen (level) into the transforaminal epidural space.   Additional Comments:  The patient tolerated the procedure well Dressing: 2 x 2 sterile gauze and Band-Aid    Post-procedure details: Patient was observed during the procedure. Post-procedure instructions were reviewed.  Patient left the clinic in stable condition.

## 2021-09-09 NOTE — Progress Notes (Addendum)
Allison Whitaker - 80 y.o. female MRN [id number]  Date of birth: Sep 26, 1941  Office Visit Note: Visit Date: 09/06/2021 PCP: Burnard Bunting, MD Referred by: Burnard Bunting, MD  Subjective: Chief Complaint  Patient presents with   Lower Back - Pain   Right Hip - Pain   Right Leg - Weakness   HPI:  Allison Whitaker is a 80 y.o. female who comes in today  For what was supposed to be potentially a repeat epidural injection but she is having clear signs of right hip and groin pain with a known history of hip osteoarthritis.  Prior x-rays did show moderate right hip osteoarthritis.  We elected today to complete a diagnostic and hopefully therapeutic right hip joint injection.  ROS Otherwise per HPI.  Assessment & Plan: Visit Diagnoses:    ICD-10-CM   1. Lumbar radiculopathy  M54.16 XR C-ARM NO REPORT    Epidural Steroid injection    methylPREDNISolone acetate (DEPO-MEDROL) injection 80 mg      Plan: No additional findings.   Meds & Orders:  Meds ordered this encounter  Medications   methylPREDNISolone acetate (DEPO-MEDROL) injection 80 mg    Orders Placed This Encounter  Procedures   XR C-ARM NO REPORT   Epidural Steroid injection    Follow-up: Return if symptoms worsen or fail to improve.   Procedures: Preprocedure diagnosis is right hip pain.  Procedure performed is intra-articular right hip injection with fluoroscopic guidance.  Appropriate patient identification was performed during timeout.  Patient was prepped and draped in appropriate fashion and injection proceeded without difficulty with good fluoroscopic flow contrast showing intra-articular injection.  Patient was discharged without issue.  Band-Aid was placed over injection site.    Clinical History: CT LUMBAR SPINE WITH INTRATHECAL CONTRAST     FINDINGS: 5 non rib-bearing lumbar segments labeled L1-L5. Benign vertebral hemangiomas in the L1, L2, and L4 vertebral bodies. Negative for fracture.   T12-L1:  Interspace unremarkable. Central canal and foramina patent.   L1-2: Conus terminates at the interspace. Mild circumferential calcified disc bulge. Mild right lateral recess encroachment. No significant spinal or foraminal stenosis.   L2-3: Mild narrowing of the interspace on the right. Moderate calcified posterior disc bulge right worse than left. There is mild facet DJD left worse than right and some thickening of the ligamentum flavum contributing to moderate central canal stenosis, right foraminal stenosis, and bilateral lateral recess stenosis.   L3-4: Calcified circumferential disc bulge. 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   L4-5: Advanced bilateral facet DJD with grade 1 anterolisthesis. Mild posterior calcified disc bulge contributes to severe multifactorial spinal stenosis and bilateral foraminal encroachment.   L5-S1: Small left paracentral partially calcified protrusion approaches the left S1 nerve root. No spinal or foraminal stenosis.   Moderate calcified aortoiliac atherosclerosis (ICD10-170.0). 7 mm lower pole left renal calculus without hydronephrosis. Remainder visualized paraspinal soft tissues unremarkable.   IMPRESSION: 1. Mild disc bulge L1-2 with right lateral recess encroachment. 2. 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 3. Mild multifactorial spinal stenosis L3-4 with early foraminal encroachment bilaterally. 4. Severe multifactorial spinal stenosis and grade 1 anterolisthesis L4-5 with bilateral foraminal encroachment. 5. Left paracentral protrusion L5-S1 approaches the left S1 nerve root without compression. 6. Nonobstructive left nephrolithiasis.     Electronically Signed   By: Lucrezia Europe M.D.   On: 07/13/2021  14:27     Objective:   VS:  HT:    WT:   BMI:     BP:(!) 148/83  HR:88bpm  TEMP: ( )  RESP:  Physical Exam Vitals and nursing note reviewed.  Constitutional:      General: She is not in acute distress.    Appearance: Normal appearance. She is not ill-appearing.  HENT:     Head: Normocephalic and atraumatic.     Right Ear: External ear normal.     Left Ear: External ear normal.  Eyes:     Extraocular Movements: Extraocular movements intact.  Cardiovascular:     Rate and Rhythm: Normal rate.     Pulses: Normal pulses.  Pulmonary:     Effort: Pulmonary effort is normal. No respiratory distress.  Abdominal:     General: There is no distension.     Palpations: Abdomen is soft.  Musculoskeletal:        General: Tenderness present.     Cervical back: Neck supple.     Right lower leg: No edema.     Left lower leg: No edema.     Comments: Patient has good distal strength with no pain over the greater trochanters.  No clonus or focal weakness.  Skin:    Findings: No erythema, lesion or rash.  Neurological:     General: No focal deficit present.     Mental Status: She is alert and oriented to person, place, and time.     Sensory: No sensory deficit.     Motor: No weakness or abnormal muscle tone.     Coordination: Coordination normal.  Psychiatric:        Mood and Affect: Mood normal.        Behavior: Behavior normal.     Imaging: No results found.

## 2021-09-26 ENCOUNTER — Telehealth: Payer: Self-pay | Admitting: Physical Medicine and Rehabilitation

## 2021-09-26 DIAGNOSIS — M48061 Spinal stenosis, lumbar region without neurogenic claudication: Secondary | ICD-10-CM

## 2021-09-26 DIAGNOSIS — M5416 Radiculopathy, lumbar region: Secondary | ICD-10-CM

## 2021-09-26 NOTE — Telephone Encounter (Signed)
Patient called needing to schedule an appointment with Dr Ernestina Patches for her back. The number to contact patient is [phone]

## 2021-09-26 NOTE — Telephone Encounter (Signed)
Patient had right L5-S1 IL on 8/15. She was scheduled for bilateral L4 TF on 9/15, but she had a left intraarticular hip injection instead. She states that she is having buttock pain and low back pain. Please advise.

## 2021-10-08 ENCOUNTER — Ambulatory Visit (INDEPENDENT_AMBULATORY_CARE_PROVIDER_SITE_OTHER): Payer: Medicare Other | Admitting: Physical Medicine and Rehabilitation

## 2021-10-08 ENCOUNTER — Encounter: Payer: Self-pay | Admitting: Physical Medicine and Rehabilitation

## 2021-10-08 ENCOUNTER — Ambulatory Visit: Payer: Self-pay

## 2021-10-08 ENCOUNTER — Other Ambulatory Visit: Payer: Self-pay

## 2021-10-08 VITALS — BP 146/82 | HR 85

## 2021-10-08 DIAGNOSIS — M5416 Radiculopathy, lumbar region: Secondary | ICD-10-CM | POA: Diagnosis not present

## 2021-10-08 MED ORDER — METHYLPREDNISOLONE ACETATE 80 MG/ML IJ SUSP
80.0000 mg | Freq: Once | INTRAMUSCULAR | Status: AC
Start: 2021-10-08 — End: 2021-10-08
  Administered 2021-10-08: 80 mg

## 2021-10-08 NOTE — Progress Notes (Signed)
Pt state lower back pain.Pt state sitting and standing makes the pain worse. Pt state uses heat, ice and takes pain meds to help ease her pain. Pt has hx of inj on 09/06/21 pt state it helped.  Numeric Pain Rating Scale and Functional Assessment Average Pain 6   In the last MONTH (on 0-10 scale) has pain interfered with the following?  1. General activity like being  able to carry out your everyday physical activities such as walking, climbing stairs, carrying groceries, or moving a chair?  Rating(8)   +Driver, -BT, -Dye Allergies.

## 2021-10-08 NOTE — Patient Instructions (Signed)

## 2021-10-22 ENCOUNTER — Encounter: Payer: Self-pay | Admitting: Physical Medicine and Rehabilitation

## 2021-10-22 DIAGNOSIS — Z6833 Body mass index (BMI) 33.0-33.9, adult: Secondary | ICD-10-CM | POA: Insufficient documentation

## 2021-10-22 DIAGNOSIS — M810 Age-related osteoporosis without current pathological fracture: Secondary | ICD-10-CM | POA: Insufficient documentation

## 2021-10-22 DIAGNOSIS — M1991 Primary osteoarthritis, unspecified site: Secondary | ICD-10-CM | POA: Insufficient documentation

## 2021-10-22 NOTE — Progress Notes (Signed)
Allison Whitaker - 80 y.o. female MRN [id number]  Date of birth: 1941-04-21  Office Visit Note: Visit Date: 10/08/2021 PCP: Burnard Bunting, MD Referred by: Burnard Bunting, MD  Subjective: Chief Complaint  Patient presents with   Lower Back - Pain   HPI:  Allison Whitaker is a 80 y.o. female who comes in today for planned repeat Right L4-5  Lumbar Transforaminal epidural steroid injection with fluoroscopic guidance.  The patient has failed conservative care including home exercise, medications, time and activity modification.  This injection will be diagnostic and hopefully therapeutic.  Please see requesting physician notes for further details and justification. Patient received more than 50% pain relief from prior injection.   Referring: Dr. Eduard Roux   ROS Otherwise per HPI.  Assessment & Plan: Visit Diagnoses:    ICD-10-CM   1. Lumbar radiculopathy  M54.16 XR C-ARM NO REPORT    Epidural Steroid injection    methylPREDNISolone acetate (DEPO-MEDROL) injection 80 mg      Plan: No additional findings.   Meds & Orders:  Meds ordered this encounter  Medications   methylPREDNISolone acetate (DEPO-MEDROL) injection 80 mg    Orders Placed This Encounter  Procedures   XR C-ARM NO REPORT   Epidural Steroid injection    Follow-up: Return if symptoms worsen or fail to improve.   Procedures: No procedures performed  Lumbosacral Transforaminal Epidural Steroid Injection - Sub-Pedicular Approach with Fluoroscopic Guidance  Patient: Allison Whitaker      Date of Birth: [birth year] MRN: [id number] PCP: Burnard Bunting, MD      Visit Date: 10/08/2021   Universal Protocol:    Date/Time: 10/08/2021  Consent Given By: the patient  Position: PRONE  Additional Comments: Vital signs were monitored before and after the procedure. Patient was prepped and draped in the usual sterile fashion. The correct patient, procedure, and site was verified.   Injection Procedure Details:    Procedure diagnoses: Lumbar radiculopathy [M54.16]    Meds Administered:  Meds ordered this encounter  Medications   methylPREDNISolone acetate (DEPO-MEDROL) injection 80 mg    Laterality: Right  Location/Site: L4  Needle:5.0 in., 22 ga.  Short bevel or Quincke spinal needle  Needle Placement: Transforaminal  Findings:    -Comments: Excellent flow of contrast along the nerve, nerve root and into the epidural space.  Procedure Details: After squaring off the end-plates to get a true AP view, the C-arm was positioned so that an oblique view of the foramen as noted above was visualized. The target area is just inferior to the "nose of the scotty dog" or sub pedicular. The soft tissues overlying this structure were infiltrated with 2-3 ml. of 1% Lidocaine without Epinephrine.  The spinal needle was inserted toward the target using a "trajectory" view along the fluoroscope beam.  Under AP and lateral visualization, the needle was advanced so it did not puncture dura and was located close the 6 O'Clock position of the pedical in AP tracterory. Biplanar projections were used to confirm position. Aspiration was confirmed to be negative for CSF and/or blood. A 1-2 ml. volume of Isovue-250 was injected and flow of contrast was noted at each level. Radiographs were obtained for documentation purposes.   After attaining the desired flow of contrast documented above, a 0.5 to 1.0 ml test dose of 0.25% Marcaine was injected into each respective transforaminal space.  The patient was observed for 90 seconds post injection.  After no sensory deficits were reported, and normal lower extremity motor  function was noted,   the above injectate was administered so that equal amounts of the injectate were placed at each foramen (level) into the transforaminal epidural space.   Additional Comments:  No complications occurred Dressing: 2 x 2 sterile gauze and Band-Aid    Post-procedure details: Patient  was observed during the procedure. Post-procedure instructions were reviewed.  Patient left the clinic in stable condition.     Clinical History: CT LUMBAR SPINE WITH INTRATHECAL CONTRAST     FINDINGS: 5 non rib-bearing lumbar segments labeled L1-L5. Benign vertebral hemangiomas in the L1, L2, and L4 vertebral bodies. Negative for fracture.   T12-L1: Interspace unremarkable. Central canal and foramina patent.   L1-2: Conus terminates at the interspace. Mild circumferential calcified disc bulge. Mild right lateral recess encroachment. No significant spinal or foraminal stenosis.   L2-3: Mild narrowing of the interspace on the right. Moderate calcified posterior disc bulge right worse than left. There is mild facet DJD left worse than right and some thickening of the ligamentum flavum contributing to moderate central canal stenosis, right foraminal stenosis, and bilateral lateral recess stenosis.   L3-4: Calcified circumferential disc bulge. 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   L4-5: Advanced bilateral facet DJD with grade 1 anterolisthesis. Mild posterior calcified disc bulge contributes to severe multifactorial spinal stenosis and bilateral foraminal encroachment.   L5-S1: Small left paracentral partially calcified protrusion approaches the left S1 nerve root. No spinal or foraminal stenosis.   Moderate calcified aortoiliac atherosclerosis (ICD10-170.0). 7 mm lower pole left renal calculus without hydronephrosis. Remainder visualized paraspinal soft tissues unremarkable.   IMPRESSION: 1. Mild disc bulge L1-2 with right lateral recess encroachment. 2. 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 3. Mild multifactorial spinal stenosis L3-4 with early  foraminal encroachment bilaterally. 4. Severe multifactorial spinal stenosis and grade 1 anterolisthesis L4-5 with bilateral foraminal encroachment. 5. Left paracentral protrusion L5-S1 approaches the left S1 nerve root without compression. 6. Nonobstructive left nephrolithiasis.     Electronically Signed   By: Lucrezia Europe M.D.   On: 07/13/2021 14:27     Objective:  VS:  HT:    WT:   BMI:     BP:(!) 146/82  HR:85bpm  TEMP: ( )  RESP:  Physical Exam Vitals and nursing note reviewed.  Constitutional:      General: She is not in acute distress.    Appearance: Normal appearance. She is not ill-appearing.  HENT:     Head: Normocephalic and atraumatic.     Right Ear: External ear normal.     Left Ear: External ear normal.  Eyes:     Extraocular Movements: Extraocular movements intact.  Cardiovascular:     Rate and Rhythm: Normal rate.     Pulses: Normal pulses.  Pulmonary:     Effort: Pulmonary effort is normal. No respiratory distress.  Abdominal:     General: There is no distension.     Palpations: Abdomen is soft.  Musculoskeletal:        General: Tenderness present.     Cervical back: Neck supple.     Right lower leg: No edema.     Left lower leg: No edema.     Comments: Patient has good distal strength with no pain over the greater trochanters.  No clonus or focal weakness.  Skin:    Findings: No erythema, lesion or  rash.  Neurological:     General: No focal deficit present.     Mental Status: She is alert and oriented to person, place, and time.     Sensory: No sensory deficit.     Motor: No weakness or abnormal muscle tone.     Coordination: Coordination normal.  Psychiatric:        Mood and Affect: Mood normal.        Behavior: Behavior normal.     Imaging: No results found.

## 2021-10-22 NOTE — Procedures (Signed)
Lumbosacral Transforaminal Epidural Steroid Injection - Sub-Pedicular Approach with Fluoroscopic Guidance  Patient: Allison Whitaker      Date of Birth: 09-27-1941 MRN: [id number] PCP: Burnard Bunting, MD      Visit Date: 10/08/2021   Universal Protocol:    Date/Time: 10/08/2021  Consent Given By: the patient  Position: PRONE  Additional Comments: Vital signs were monitored before and after the procedure. Patient was prepped and draped in the usual sterile fashion. The correct patient, procedure, and site was verified.   Injection Procedure Details:   Procedure diagnoses: Lumbar radiculopathy [M54.16]    Meds Administered:  Meds ordered this encounter  Medications   methylPREDNISolone acetate (DEPO-MEDROL) injection 80 mg    Laterality: Right  Location/Site: L4  Needle:5.0 in., 22 ga.  Short bevel or Quincke spinal needle  Needle Placement: Transforaminal  Findings:    -Comments: Excellent flow of contrast along the nerve, nerve root and into the epidural space.  Procedure Details: After squaring off the end-plates to get a true AP view, the C-arm was positioned so that an oblique view of the foramen as noted above was visualized. The target area is just inferior to the "nose of the scotty dog" or sub pedicular. The soft tissues overlying this structure were infiltrated with 2-3 ml. of 1% Lidocaine without Epinephrine.  The spinal needle was inserted toward the target using a "trajectory" view along the fluoroscope beam.  Under AP and lateral visualization, the needle was advanced so it did not puncture dura and was located close the 6 O'Clock position of the pedical in AP tracterory. Biplanar projections were used to confirm position. Aspiration was confirmed to be negative for CSF and/or blood. A 1-2 ml. volume of Isovue-250 was injected and flow of contrast was noted at each level. Radiographs were obtained for documentation purposes.   After attaining the desired flow  of contrast documented above, a 0.5 to 1.0 ml test dose of 0.25% Marcaine was injected into each respective transforaminal space.  The patient was observed for 90 seconds post injection.  After no sensory deficits were reported, and normal lower extremity motor function was noted,   the above injectate was administered so that equal amounts of the injectate were placed at each foramen (level) into the transforaminal epidural space.   Additional Comments:  No complications occurred Dressing: 2 x 2 sterile gauze and Band-Aid    Post-procedure details: Patient was observed during the procedure. Post-procedure instructions were reviewed.  Patient left the clinic in stable condition.

## 2021-12-20 ENCOUNTER — Telehealth: Payer: Self-pay | Admitting: Physical Medicine and Rehabilitation

## 2021-12-20 NOTE — Telephone Encounter (Signed)
PT called and states that the last injection was 10/08/2021 L4 TF workled about 40% last time. Would you like to schedule an OV?

## 2022-01-01 ENCOUNTER — Encounter: Payer: Self-pay | Admitting: Physical Medicine and Rehabilitation

## 2022-01-08 ENCOUNTER — Ambulatory Visit (INDEPENDENT_AMBULATORY_CARE_PROVIDER_SITE_OTHER): Payer: Medicare Other | Admitting: Physical Medicine and Rehabilitation

## 2022-01-08 ENCOUNTER — Encounter: Payer: Self-pay | Admitting: Physical Medicine and Rehabilitation

## 2022-01-08 ENCOUNTER — Ambulatory Visit: Payer: Self-pay

## 2022-01-08 ENCOUNTER — Other Ambulatory Visit: Payer: Self-pay

## 2022-01-08 DIAGNOSIS — M25551 Pain in right hip: Secondary | ICD-10-CM

## 2022-01-08 DIAGNOSIS — M5416 Radiculopathy, lumbar region: Secondary | ICD-10-CM

## 2022-01-08 DIAGNOSIS — M48062 Spinal stenosis, lumbar region with neurogenic claudication: Secondary | ICD-10-CM

## 2022-01-08 DIAGNOSIS — R1031 Right lower quadrant pain: Secondary | ICD-10-CM

## 2022-01-08 DIAGNOSIS — G8929 Other chronic pain: Secondary | ICD-10-CM

## 2022-01-08 DIAGNOSIS — M47816 Spondylosis without myelopathy or radiculopathy, lumbar region: Secondary | ICD-10-CM

## 2022-01-08 NOTE — Progress Notes (Signed)
Pt state lower back pain that travels to her right hip and both legs. Pt state walking, standing and laying down makes the pain worse. Pt state she takes over the counter pain meds to help ease her pain.  Numeric Pain Rating Scale and Functional Assessment Average Pain 9 Pain Right Now 7 My pain is constant, sharp, stabbing, and aching Pain is worse with: walking, standing, some activites, and laying down Pain improves with: medication   In the last MONTH (on 0-10 scale) has pain interfered with the following?  1. General activity like being  able to carry out your everyday physical activities such as walking, climbing stairs, carrying groceries, or moving a chair?  Rating(7)  2. Relation with others like being able to carry out your usual social activities and roles such as  activities at home, at work and in your community. Rating(8)  3. Enjoyment of life such that you have  been bothered by emotional problems such as feeling anxious, depressed or irritable?  Rating(9)

## 2022-01-08 NOTE — Progress Notes (Signed)
Allison Whitaker - 81 y.o. female MRN [id number]  Date of birth: 24-Nov-1941  Office Visit Note: Visit Date: 01/08/2022 PCP: Burnard Bunting, MD Referred by: Burnard Bunting, MD  Subjective: Chief Complaint  Patient presents with   Lower Back - Pain   Left Leg - Pain   Right Leg - Pain   Right Hip - Pain   HPI: Allison Whitaker is a 81 y.o. female who comes in today for evaluation of chronic, worsening and severe right sided buttock pain radiating to right groin area. Patient reports pain has been ongoing for several months, she reports pain is exacerbated by movement and activity, describes as a dull aching sensation, currently rates as 8 out of 10. Patient reports some relief of pain with rest and use of OTC medications. Patients bilateral hip x-ray images from 2022 exhibits moderate right hip joint space narrowing. Patient had right intra-articular hip injection performed by Dr. Magnus Sinning in September of 2022 and does reports significant and sustained pain relief until recently.   She also reports chronic, worsening and severe bilateral lower back pain radiating to posterior legs.  Patient reports pain has been ongoing for several years. Patient reports pain is exacerbated by prolonged walking/standing and activity. Patient describes pain as constant soreness sensation, currently rates as 7 out of 10. Patient reports some relief of pain with rest and use of OTC medications as needed. Patient did attend formal physical therapy including dry needling with our in-house team last year and reports some relief of pain with these treatments. Patients lumbar myelogram from 2022 exhibits severe multifactorial spinal stenosis with grade 1 anterolisthesis at L4-L5. Patient does have history of multiple lumbar epidural steroid injections with minimal relief of pain. Patient denies focal weakness. Patient denies recent trauma or falls.   Review of Systems  Musculoskeletal:  Positive for back pain and  joint pain.  Neurological:  Negative for tingling, sensory change, focal weakness and weakness.  All other systems reviewed and are negative. Otherwise per HPI.  Assessment & Plan: Visit Diagnoses:    ICD-10-CM   1. Pain in right hip  M25.551 XR C-ARM NO REPORT    2. Groin pain, chronic, right  R10.31    G89.29     3. Lumbar radiculopathy  M54.16     4. Spinal stenosis of lumbar region with neurogenic claudication  M48.062     5. Facet hypertrophy of lumbar region  M47.816        Plan: Findings:  1. Chronic, worsening and severe right sided buttock pain radiating to right groin area. Patient continues to have severe pain despite good conservative therapies such as rest and use of medications. Patient does voice significant relief of pain with previous right intra-articular hip injection last year.  We feel the next step is to repeat right intra-articular hip injection, which we did perform today without any issues.  If patient does not get good relief with right hip injection and continues to have severe pain we do feel that follow-up with one of our orthopedic specialists would be beneficial to discuss treatment options and further imaging. Patient has seen Dr. Eduard Roux in the past.  2. Chronic, worsening and severe bilateral lower back pain radiating to posterior legs.  Patient continues to have excruciating pain despite good conservative therapy such as formal physical therapy, dry needling, rest and use of medications.  Patient's clinical presentation and exam are consistent with neurogenic claudication as a result of spinal canal stenosis.  Patient received minimal relief with previous lumbar epidural steroid injections.  We do feel that patient would benefit from a surgical consultation to discuss further treatment options, we are happy to provide patient with recommendation and place referral to neurosurgeon.  At this time patient would like to hold on surgical consultation and would  like to see if hip injection helps to alleviate her pain.  We did discuss other treatment options with patient such as regrouping with physical therapy, chiropractic treatments, use of TENS unit and medication management.  We instructed patient to follow-up with [place] as needed.  No red flag symptoms noted upon exam today.   Meds & Orders: No orders of the defined types were placed in this encounter.   Orders Placed This Encounter  Procedures   Large Joint Inj   XR C-ARM NO REPORT    Follow-up: Return if symptoms worsen or fail to improve.   Procedures: Large Joint Inj: R hip joint on 01/08/2022 3:40 PM Indications: diagnostic evaluation and pain Details: 22 G 3.5 in needle, fluoroscopy-guided anterior approach  Arthrogram: No  Medications: 4 mL bupivacaine 0.25 %; 60 mg triamcinolone acetonide 40 MG/ML Outcome: tolerated well, no immediate complications  There was excellent flow of contrast producing a partial arthrogram of the hip. The patient did have relief of symptoms during the anesthetic phase of the injection. Procedure, treatment alternatives, risks and benefits explained, specific risks discussed. Consent was given by the patient. Immediately prior to procedure a time out was called to verify the correct patient, procedure, equipment, support staff and site/side marked as required. Patient was prepped and draped in the usual sterile fashion.         Clinical History: CT LUMBAR SPINE WITH INTRATHECAL CONTRAST     FINDINGS: 5 non rib-bearing lumbar segments labeled L1-L5. Benign vertebral hemangiomas in the L1, L2, and L4 vertebral bodies. Negative for fracture.   T12-L1: Interspace unremarkable. Central canal and foramina patent.   L1-2: Conus terminates at the interspace. Mild circumferential calcified disc bulge. Mild right lateral recess encroachment. No significant spinal or foraminal stenosis.   L2-3: Mild narrowing of the interspace on the right.  Moderate calcified posterior disc bulge right worse than left. There is mild facet DJD left worse than right and some thickening of the ligamentum flavum contributing to moderate central canal stenosis, right foraminal stenosis, and bilateral lateral recess stenosis.   L3-4: Calcified circumferential disc bulge. 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   L4-5: Advanced bilateral facet DJD with grade 1 anterolisthesis. Mild posterior calcified disc bulge contributes to severe multifactorial spinal stenosis and bilateral foraminal encroachment.   L5-S1: Small left paracentral partially calcified protrusion approaches the left S1 nerve root. No spinal or foraminal stenosis.   Moderate calcified aortoiliac atherosclerosis (ICD10-170.0). 7 mm lower pole left renal calculus without hydronephrosis. Remainder visualized paraspinal soft tissues unremarkable.   IMPRESSION: 1. Mild disc bulge L1-2 with right lateral recess encroachment. 2. 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 3. Mild multifactorial spinal stenosis L3-4 with early foraminal encroachment bilaterally. 4. Severe multifactorial spinal stenosis and grade 1 anterolisthesis L4-5 with bilateral foraminal encroachment. 5. Left paracentral protrusion L5-S1 approaches the left S1 nerve root without compression. 6. Nonobstructive left nephrolithiasis.     Electronically Signed   By: Lucrezia Europe M.D.   On: 07/13/2021 14:27   She reports that  she has never smoked. She has never used smokeless tobacco. No results for input(s): HGBA1C, LABURIC in the last 8760 hours.  Objective:  VS:  HT:     WT:    BMI:      BP:    HR: bpm   TEMP: ( )   RESP:  Physical Exam Vitals and nursing note reviewed.  HENT:     Head: Normocephalic  and atraumatic.     Right Ear: External ear normal.     Left Ear: External ear normal.     Nose: Nose normal.     Mouth/Throat:     Mouth: Mucous membranes are moist.  Eyes:     Extraocular Movements: Extraocular movements intact.  Cardiovascular:     Rate and Rhythm: Normal rate.     Pulses: Normal pulses.  Pulmonary:     Effort: Pulmonary effort is normal.  Abdominal:     General: Abdomen is flat. There is no distension.  Musculoskeletal:        General: Tenderness present.     Cervical back: Normal range of motion.     Comments: Pt has no significant limitations in mobility. No pain upon palpation of greater trochanters. Full range of motion noted. Distal sensation intact. Pain noted to right hip with internal/external rotation.  Pt is slow to rise from seated position to standing. Good lumbar range of motion. Strong distal strength without clonus, no pain upon palpation of greater trochanters. Sensation intact bilaterally. Walks independently, gait steady.   Skin:    General: Skin is warm and dry.     Capillary Refill: Capillary refill takes less than 2 seconds.  Neurological:     General: No focal deficit present.     Mental Status: She is alert and oriented to person, place, and time.  Psychiatric:        Mood and Affect: Mood normal.    Ortho Exam  Imaging: XR C-ARM NO REPORT  Result Date: 01/08/2022 Please see Notes tab for imaging impression.   Past Medical/Family/Surgical/Social History: Medications & Allergies reviewed per EMR, new medications updated. 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   Rheumatoid arthritis ([place]) 12/23/2004   Past Medical History:  Diagnosis Date   Arthritis    Rheumatoid    GERD (gastroesophageal reflux disease)    Hypertension     Rheumatoid arthritis(714.0)    Thyroid disease    Family History  Problem Relation Age of Onset   Stroke Mother    Colon cancer Neg Hx    Past Surgical History:  Procedure Laterality Date   OOPHORECTOMY  53   WRIST SURGERY  2009   for tendonitis   Social History   Occupational History   Not on file  Tobacco Use   Smoking status: Never   Smokeless tobacco: Never  Substance and Sexual Activity   Alcohol use: Yes    Alcohol/week: 2.0 standard drinks    Types: 2 Glasses of wine per week   Drug use: No   Sexual activity: Not on file

## 2022-01-09 MED ORDER — TRIAMCINOLONE ACETONIDE 40 MG/ML IJ SUSP
60.0000 mg | INTRAMUSCULAR | Status: AC | PRN
  Administered 2022-01-08: 60 mg via INTRA_ARTICULAR

## 2022-01-09 MED ORDER — BUPIVACAINE HCL 0.25 % IJ SOLN
4.0000 mL | INTRAMUSCULAR | Status: AC | PRN
  Administered 2022-01-08: 4 mL via INTRA_ARTICULAR

## 2022-02-26 ENCOUNTER — Encounter: Payer: Self-pay | Admitting: Gastroenterology

## 2022-04-09 ENCOUNTER — Ambulatory Visit (INDEPENDENT_AMBULATORY_CARE_PROVIDER_SITE_OTHER): Payer: Medicare Other

## 2022-04-09 ENCOUNTER — Ambulatory Visit: Payer: Medicare Other | Admitting: Orthopaedic Surgery

## 2022-04-09 DIAGNOSIS — M1611 Unilateral primary osteoarthritis, right hip: Secondary | ICD-10-CM

## 2022-04-09 NOTE — Progress Notes (Signed)
? ?Office Visit Note ?  ?Patient: Allison Whitaker           ?Date of Birth: 02-14-1941           ?MRN: [id number] ?Visit Date: 04/09/2022 ?             ?Requested by: Burnard Bunting, MD ?[address] ?[place],  [place] [address] ?PCP: Burnard Bunting, MD ? ? ?Assessment & Plan: ?Visit Diagnoses:  ?1. Primary osteoarthritis of right hip   ? ? ?Plan: Impression is advanced right hip degenerative joint disease.  After a long discussion on treatment options she has elected to move forward with a right total hip replacement in the near future pending clearance from PCP who she just saw her recently.  Risk benefits rehab recovery prognosis reviewed with the patient in detail.  She has a trip in mid July that she would like to do. ? ?Follow-Up Instructions: No follow-ups on file.  ? ?Orders:  ?Orders Placed This Encounter  ?Procedures  ? XR HIP UNILAT W OR W/O PELVIS 2-3 VIEWS RIGHT  ? ?No orders of the defined types were placed in this encounter. ? ? ? ? Procedures: ?No procedures performed ? ? ?Clinical Data: ?No additional findings. ? ? ?Subjective: ?Chief Complaint  ?Patient presents with  ? Right Hip - Pain  ? ? ?HPI ? ?Allison Whitaker is a very pleasant 81 year old female who is the grandmother of Dorita Fray she from the operating room who comes in for evaluation of chronic right hip and groin pain for years.  This is progressively gotten worse.  She has had 2 cortisone injections to the right hip each with temporary and partial relief.  She is significantly limited by her hip pain with ADLs.  She states that the pain is on average 5-6 but up to 8 out of 10 with increased activity and walking and standing.  Denies any radicular symptoms.  Has taken over-the-counter medications without significant relief. ? ?Review of Systems  ?Constitutional: Negative.   ?HENT: Negative.    ?Eyes: Negative.   ?Respiratory: Negative.    ?Cardiovascular: Negative.   ?Endocrine: Negative.   ?Musculoskeletal: Negative.   ?Neurological:  Negative.   ?Hematological: Negative.   ?Psychiatric/Behavioral: Negative.    ?All other systems reviewed and are negative. ? ? ?Objective: ?Vital Signs: There were no vitals taken for this visit. ? ?Physical Exam ?Vitals and nursing note reviewed.  ?Constitutional:   ?   Appearance: She is well-developed.  ?Pulmonary:  ?   Effort: Pulmonary effort is normal.  ?Skin: ?   General: Skin is warm.  ?   Capillary Refill: Capillary refill takes less than 2 seconds.  ?Neurological:  ?   Mental Status: She is alert and oriented to person, place, and time.  ?Psychiatric:     ?   Behavior: Behavior normal.     ?   Thought Content: Thought content normal.     ?   Judgment: Judgment normal.  ? ? ?Ortho Exam ? ?Examination of the right hip shows moderate limitation range of motion.  Slight limp when walking.  Pain with logroll and FADIR.  Lateral hip is nontender. ? ?Specialty Comments:  ?No specialty comments available. ? ?Imaging: ?XR HIP UNILAT W OR W/O PELVIS 2-3 VIEWS RIGHT ? ?Result Date: 04/09/2022 ?Advanced right hip joint arthritis with femoral head osteophytes.  ? ? ?PMFS History: ?Patient Active Problem List  ? Diagnosis Date Noted  ? Age-related osteoporosis without current pathological fracture 10/22/2021  ?  Body mass index (BMI) 33.0-33.9, adult 10/22/2021  ? Hypercalcemia 10/22/2021  ? 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 Rheumatoid arthritis ([place]) 12/23/2004  ? ?Past Medical History:  ?Diagnosis Date  ? Arthritis   ? Rheumatoid   ? GERD (gastroesophageal reflux disease)   ? Hypertension   ? Rheumatoid arthritis(714.0)   ? Thyroid disease   ?  ?Family History  ?Problem Relation Age of Onset  ? Stroke Mother   ? Colon cancer Neg Hx   ?  ?Past Surgical History:  ?Procedure Laterality Date  ? OOPHORECTOMY  1983  ? WRIST SURGERY  2009  ? for tendonitis  ? ?Social History  ? ?Occupational History  ? Not on file  ?Tobacco Use  ? Smoking status: Never  ? Smokeless tobacco: Never  ?Substance and Sexual Activity  ? Alcohol  use: Yes  ?  Alcohol/week: 2.0 standard drinks  ?  Types: 2 Glasses of wine per week  ? Drug use: No  ? Sexual activity: Not on file  ? ? ? ? ? ? ?

## 2022-04-10 ENCOUNTER — Other Ambulatory Visit: Payer: Self-pay

## 2022-05-03 NOTE — Pre-Procedure Instructions (Addendum)
Surgical Instructions ? ? ? Your procedure is scheduled on Monday, May 22. ? Report to [facility] Main Entrance "A" at 9:30 A.M., then check in with the Admitting office. ? Call this number if you have problems the morning of surgery: ? [phone] ? ? If you have any questions prior to your surgery date call [phone]: Open Monday-Friday 8am-4pm ? ? ? Remember: ? Do not eat after midnight the night before your surgery ? ?You may drink clear liquids until 9:00AM the morning of your surgery.   ?Clear liquids allowed are: Water, Non-Citrus Juices (without pulp), Carbonated Beverages, Clear Tea, Black Coffee ONLY (NO MILK, CREAM OR POWDERED CREAMER of any kind), and Gatorade ? ?Patient Instructions ? ?The night before surgery:  ?No food after midnight. ONLY clear liquids after midnight ? ?The day of surgery (if you do NOT have diabetes):  ?Drink ONE (1) Pre-Surgery Clear Ensure by 9:00 AM the morning of surgery. Drink in one sitting. Do not sip.  ?This drink was given to you during your hospital  ?pre-op appointment visit. ? ?Nothing else to drink after completing the  ?Pre-Surgery Clear Ensure. ? ? ? ?       If you have questions, please contact your surgeon?s office. ? ?  ? Take these medicines the morning of surgery with A SIP OF WATER:  ? ?omeprazole (PRILOSEC)  ? ?As of today, STOP taking any Aspirin (unless otherwise instructed by your surgeon) Aleve, Naproxen, Ibuprofen, Motrin, Advil, Goody's, BC's, all herbal medications, fish oil, and all vitamins. ? ? **Contact your surgeon's office regarding taking your Methotrexate (Rheumatrex). ?  ?Do not wear jewelry or makeup ?Do not wear lotions, powders, perfumes/colognes, or deodorant. ?Do not shave 48 hours prior to surgery.  Men may shave face and neck. ?Do not bring valuables to the hospital. ?Do not wear nail polish, gel polish, artificial nails, or any other type of covering on natural nails (fingers and toes) ?If you have artificial nails or gel coating  that need to be removed by a nail salon, please have this removed prior to surgery. Artificial nails or gel coating may interfere with anesthesia's ability to adequately monitor your vital signs. ? ?[facility] is not responsible for any belongings or valuables. .  ? ?Do NOT Smoke (Tobacco/Vaping)  24 hours prior to your procedure ? ?If you use a CPAP at night, you may bring your mask for your overnight stay. ?  ?Contacts, glasses, hearing aids, dentures or partials may not be worn into surgery, please bring cases for these belongings ?  ?For patients admitted to the hospital, discharge time will be determined by your treatment team. ?  ?Patients discharged the day of surgery will not be allowed to drive home, and someone needs to stay with them for 24 hours. ? ? ?SURGICAL WAITING ROOM VISITATION ?Patients having surgery or a procedure in a hospital may have two support people. ?Children under the age of 48 must have an adult with them who is not the patient. ?They may stay in the waiting area during the procedure and may switch out with other visitors. If the patient needs to stay at the hospital during part of their recovery, the visitor guidelines for inpatient rooms apply. ? ?Please refer to the [facility] website for the visitor guidelines for Inpatients (after your surgery is over and you are in a regular room).  ? ? ? ? ? ?Special instructions:   ? ?Oral Hygiene is also important to reduce your risk  of infection.  Remember - BRUSH YOUR TEETH THE MORNING OF SURGERY WITH YOUR REGULAR TOOTHPASTE ? ? ?[facility]- Preparing For Surgery ? ?Before surgery, you can play an important role. Because skin is not sterile, your skin needs to be as free of germs as possible. You can reduce the number of germs on your skin by washing with CHG (chlorahexidine gluconate) Soap before surgery.  CHG is an antiseptic cleaner which kills germs and bonds with the skin to continue killing germs even after washing.   ? ? ?Please do  not use if you have an allergy to CHG or antibacterial soaps. If your skin becomes reddened/irritated stop using the CHG.  ?Do not shave (including legs and underarms) for at least 48 hours prior to first CHG shower. It is OK to shave your face. ? ?Please follow these instructions carefully. ?  ? ? Shower the NIGHT BEFORE SURGERY and the MORNING OF SURGERY with CHG Soap.  ? If you chose to wash your hair, wash your hair first as usual with your normal shampoo. After you shampoo, rinse your hair and body thoroughly to remove the shampoo.  Then [employer] and genitals (private parts) with your normal soap and rinse thoroughly to remove soap. ? ?After that Use CHG Soap as you would any other liquid soap. You can apply CHG directly to the skin and wash gently with a scrungie or a clean washcloth.  ? ?Apply the CHG Soap to your body ONLY FROM THE NECK DOWN.  Do not use on open wounds or open sores. Avoid contact with your eyes, ears, mouth and genitals (private parts). Wash Face and genitals (private parts)  with your normal soap.  ? ?Wash thoroughly, paying special attention to the area where your surgery will be performed. ? ?Thoroughly rinse your body with warm water from the neck down. ? ?DO NOT shower/wash with your normal soap after using and rinsing off the CHG Soap. ? ?Pat yourself dry with a CLEAN TOWEL. ? ?Wear CLEAN PAJAMAS to bed the night before surgery ? ?Place CLEAN SHEETS on your bed the night before your surgery ? ?DO NOT SLEEP WITH PETS. ? ? ?Day of Surgery: ? ?Take a shower with CHG soap. ?Wear Clean/Comfortable clothing the morning of surgery ?Do not apply any deodorants/lotions.   ?Remember to brush your teeth WITH YOUR REGULAR TOOTHPASTE. ? ? ? ?If you received a COVID test during your pre-op visit, it is requested that you wear a mask when out in public, stay away from anyone that may not be feeling well, and notify your surgeon if you develop symptoms. If you have been in contact with anyone that  has tested positive in the last 10 days, please notify your surgeon. ? ?  ?Please read over the following fact sheets that you were given.  ? ?

## 2022-05-06 ENCOUNTER — Other Ambulatory Visit: Payer: Self-pay

## 2022-05-06 ENCOUNTER — Encounter (HOSPITAL_COMMUNITY)
Admission: RE | Admit: 2022-05-06 | Discharge: 2022-05-06 | Disposition: A | Discharge status: Home / Self Care | Payer: Medicare Other | Source: Ambulatory Visit | Attending: Orthopaedic Surgery | Admitting: Orthopaedic Surgery

## 2022-05-06 ENCOUNTER — Telehealth: Payer: Self-pay | Admitting: Orthopaedic Surgery

## 2022-05-06 ENCOUNTER — Encounter (HOSPITAL_COMMUNITY): Payer: Self-pay

## 2022-05-06 ENCOUNTER — Other Ambulatory Visit: Payer: Self-pay | Admitting: Physician Assistant

## 2022-05-06 VITALS — BP 150/70 | HR 76 | Temp 98.4°F | Resp 17 | Ht 65.0 in | Wt 205.0 lb

## 2022-05-06 DIAGNOSIS — Z01818 Encounter for other preprocedural examination: Secondary | ICD-10-CM

## 2022-05-06 DIAGNOSIS — M1611 Unilateral primary osteoarthritis, right hip: Secondary | ICD-10-CM | POA: Insufficient documentation

## 2022-05-06 DIAGNOSIS — Z01812 Encounter for preprocedural laboratory examination: Secondary | ICD-10-CM | POA: Diagnosis not present

## 2022-05-06 HISTORY — DX: Hypothyroidism, unspecified: E03.9

## 2022-05-06 LAB — CBC
HCT: 37 % (ref 36.0–46.0)
Hemoglobin: 12.2 g/dL (ref 12.0–15.0)
MCH: 30.2 pg (ref 26.0–34.0)
MCHC: 33 g/dL (ref 30.0–36.0)
MCV: 91.6 fL (ref 80.0–100.0)
Platelets: 276 10*3/uL (ref 150–400)
RBC: 4.04 MIL/uL (ref 3.87–5.11)
RDW: 14.8 % (ref 11.5–15.5)
WBC: 5.5 10*3/uL (ref 4.0–10.5)
nRBC: 0 % (ref 0.0–0.2)

## 2022-05-06 LAB — SURGICAL PCR SCREEN
MRSA, PCR: NEGATIVE
Staphylococcus aureus: POSITIVE — AB

## 2022-05-06 LAB — COMPREHENSIVE METABOLIC PANEL
ALT: 20 U/L (ref 0–44)
AST: 18 U/L (ref 15–41)
Albumin: 3.6 g/dL (ref 3.5–5.0)
Alkaline Phosphatase: 110 U/L (ref 38–126)
Anion gap: 4 — ABNORMAL LOW (ref 5–15)
BUN: 15 mg/dL (ref 8–23)
CO2: 29 mmol/L (ref 22–32)
Calcium: 10 mg/dL (ref 8.9–10.3)
Chloride: 108 mmol/L (ref 98–111)
Creatinine, Ser: 0.94 mg/dL (ref 0.44–1.00)
GFR, Estimated: >60 mL/min (ref >60)
Glucose, Bld: 101 mg/dL — ABNORMAL HIGH (ref 70–99)
Potassium: 3.5 mmol/L (ref 3.5–5.1)
Sodium: 141 mmol/L (ref 135–145)
Total Bilirubin: 0.5 mg/dL (ref 0.3–1.2)
Total Protein: 6.2 g/dL — ABNORMAL LOW (ref 6.5–8.1)

## 2022-05-06 LAB — TYPE AND SCREEN
ABO/RH(D): O POS
Antibody Screen: NEGATIVE

## 2022-05-06 MED ORDER — DOCUSATE SODIUM 100 MG PO CAPS: 100.0000 mg | ORAL_CAPSULE | Freq: Every day | ORAL | 2 refills | Status: AC | PRN

## 2022-05-06 MED ORDER — ONDANSETRON HCL 4 MG PO TABS: 4.0000 mg | ORAL_TABLET | Freq: Three times a day (TID) | ORAL | 0 refills | Status: DC | PRN

## 2022-05-06 MED ORDER — OXYCODONE-ACETAMINOPHEN 5-325 MG PO TABS: 1.0000 | ORAL_TABLET | Freq: Four times a day (QID) | ORAL | 0 refills | Status: DC | PRN

## 2022-05-06 MED ORDER — METHOCARBAMOL 500 MG PO TABS: 500.0000 mg | ORAL_TABLET | Freq: Two times a day (BID) | ORAL | 2 refills | Status: DC | PRN

## 2022-05-06 MED ORDER — ASPIRIN EC 81 MG PO TBEC
81.0000 mg | DELAYED_RELEASE_TABLET | Freq: Two times a day (BID) | ORAL | 0 refills | Status: DC
Start: 2022-05-06 — End: 2023-05-14

## 2022-05-06 NOTE — Telephone Encounter (Signed)
Pt called requesting a call back about medication before surgery next Monday. Pt states pre admissions was unsure. Please call pt at [phone] ?Marland Kitchen ?

## 2022-05-06 NOTE — Progress Notes (Addendum)
PCP - Burnard Bunting, MD ?Cardiologist - denies any cardiac symptoms or cardiac issues.  ?Rheumatologist- Dr. Leigh Aurora ? ?PPM/ICD - denies ?Chest x-ray - N/A ?EKG - 05/06/2022 - requested last EKG tracing done in 2019 for comparison ?Stress Test - denies ?ECHO - denies ?Cardiac Cath - denies ? ?Sleep Study - denies ? ?Aspirin Instructions: pt does not take aspirin currently. As of today STOP taking any Aspirin (unless otherwise instructed by your surgeon), Aleve, Naproxen, Ibuprofen, Motrin, Advil, Goody's, BC's, all herbal medications, fish oil, and all vitamins. ? ? ?ERAS Protcol - ERAS protocol with drink ordered- ENSURE provided to patient at PAT appointment ?COVID TEST- N/A ? ?BP 150/70- pt reports her BP at home is usually 130-140/70-80 ? ?Pt states she will contact Dr. Phoebe Sharps office regarding perioperative Methotrexate instructions.  ? ? ?Anesthesia review: requested last office note and EKG tracing from PCP- un able to see reading of previous EKG/office note in care everywhere.  ? ?Patient denies shortness of breath, fever, cough and chest pain at PAT appointment ? ? ?All instructions explained to the patient, with a verbal understanding of the material. Patient agrees to go over the instructions while at home for a better understanding.The opportunity to ask questions was provided. ? ? ?

## 2022-05-07 ENCOUNTER — Other Ambulatory Visit: Payer: Self-pay | Admitting: Physician Assistant

## 2022-05-07 NOTE — Telephone Encounter (Signed)
Patient aware of the below message  

## 2022-05-07 NOTE — Telephone Encounter (Signed)
Do you mind calling to see what questions she has?  I sent in meds yesterday that are to be taken after surgery

## 2022-05-07 NOTE — Telephone Encounter (Signed)
I believe she is to stop it

## 2022-05-10 MED ORDER — TRANEXAMIC ACID 1000 MG/10ML IV SOLN
2000.0000 mg | INTRAVENOUS | Status: DC
  Filled 2022-05-10 (×2): qty 20

## 2022-05-12 DIAGNOSIS — M1611 Unilateral primary osteoarthritis, right hip: Secondary | ICD-10-CM

## 2022-05-13 ENCOUNTER — Observation Stay (HOSPITAL_COMMUNITY)
Admission: RE | Admit: 2022-05-13 | Discharge: 2022-05-14 | Disposition: A | Discharge status: Home / Self Care | Payer: Medicare Other | Attending: Orthopaedic Surgery | Admitting: Orthopaedic Surgery

## 2022-05-13 ENCOUNTER — Ambulatory Visit (HOSPITAL_COMMUNITY): Payer: Medicare Other

## 2022-05-13 ENCOUNTER — Other Ambulatory Visit: Payer: Self-pay

## 2022-05-13 ENCOUNTER — Ambulatory Visit (HOSPITAL_COMMUNITY): Payer: Medicare Other | Admitting: Physician Assistant

## 2022-05-13 ENCOUNTER — Observation Stay (HOSPITAL_COMMUNITY): Payer: Medicare Other

## 2022-05-13 ENCOUNTER — Ambulatory Visit (HOSPITAL_BASED_OUTPATIENT_CLINIC_OR_DEPARTMENT_OTHER): Payer: Medicare Other | Admitting: Anesthesiology

## 2022-05-13 ENCOUNTER — Encounter (HOSPITAL_COMMUNITY): Payer: Self-pay | Admitting: Orthopaedic Surgery

## 2022-05-13 ENCOUNTER — Encounter (HOSPITAL_COMMUNITY)
Admission: RE | Disposition: A | Discharge status: Home / Self Care | Payer: Self-pay | Source: Home / Self Care | Attending: Orthopaedic Surgery

## 2022-05-13 DIAGNOSIS — E039 Hypothyroidism, unspecified: Secondary | ICD-10-CM | POA: Diagnosis not present

## 2022-05-13 DIAGNOSIS — M1611 Unilateral primary osteoarthritis, right hip: Secondary | ICD-10-CM

## 2022-05-13 DIAGNOSIS — Z79899 Other long term (current) drug therapy: Secondary | ICD-10-CM | POA: Insufficient documentation

## 2022-05-13 DIAGNOSIS — I10 Essential (primary) hypertension: Secondary | ICD-10-CM | POA: Diagnosis not present

## 2022-05-13 DIAGNOSIS — Z7982 Long term (current) use of aspirin: Secondary | ICD-10-CM | POA: Diagnosis not present

## 2022-05-13 DIAGNOSIS — Z96641 Presence of right artificial hip joint: Secondary | ICD-10-CM

## 2022-05-13 HISTORY — PX: TOTAL HIP ARTHROPLASTY: SHX124

## 2022-05-13 LAB — ABO/RH: ABO/RH(D): O POS

## 2022-05-13 SURGERY — ARTHROPLASTY, HIP, TOTAL, ANTERIOR APPROACH
Anesthesia: Monitor Anesthesia Care | Site: Hip | Laterality: Right

## 2022-05-13 MED ORDER — ONDANSETRON HCL 4 MG/2ML IJ SOLN
INTRAMUSCULAR | Status: DC | PRN
  Administered 2022-05-13: 4 mg via INTRAVENOUS

## 2022-05-13 MED ORDER — PRONTOSAN WOUND IRRIGATION OPTIME
TOPICAL | Status: DC | PRN
  Administered 2022-05-13: 1 via TOPICAL

## 2022-05-13 MED ORDER — TRANEXAMIC ACID-NACL 1000-0.7 MG/100ML-% IV SOLN
1000.0000 mg | Freq: Once | INTRAVENOUS | Status: AC
  Administered 2022-05-13: 1000 mg via INTRAVENOUS
  Filled 2022-05-13: qty 100

## 2022-05-13 MED ORDER — VANCOMYCIN HCL 1000 MG IV SOLR
INTRAVENOUS | Status: AC
  Filled 2022-05-13: qty 20

## 2022-05-13 MED ORDER — METOCLOPRAMIDE HCL 5 MG PO TABS: 5.0000 mg | ORAL_TABLET | Freq: Three times a day (TID) | ORAL | Status: DC | PRN

## 2022-05-13 MED ORDER — ONDANSETRON HCL 4 MG/2ML IJ SOLN: 4.0000 mg | Freq: Once | INTRAMUSCULAR | Status: DC | PRN

## 2022-05-13 MED ORDER — METHOCARBAMOL 1000 MG/10ML IJ SOLN
500.0000 mg | Freq: Four times a day (QID) | INTRAVENOUS | Status: DC | PRN
  Filled 2022-05-13: qty 5

## 2022-05-13 MED ORDER — SODIUM CHLORIDE 0.9 % IV SOLN: INTRAVENOUS | Status: DC

## 2022-05-13 MED ORDER — OXYCODONE HCL 5 MG PO TABS
10.0000 mg | ORAL_TABLET | ORAL | Status: DC | PRN
  Administered 2022-05-13: 10 mg via ORAL
  Filled 2022-05-13: qty 2

## 2022-05-13 MED ORDER — HYDROMORPHONE HCL 1 MG/ML IJ SOLN: 0.2500 mg | INTRAMUSCULAR | Status: DC | PRN

## 2022-05-13 MED ORDER — FERROUS SULFATE 325 (65 FE) MG PO TABS
325.0000 mg | ORAL_TABLET | Freq: Three times a day (TID) | ORAL | Status: DC
  Administered 2022-05-14: 325 mg via ORAL
  Filled 2022-05-13 (×2): qty 1

## 2022-05-13 MED ORDER — ALUM & MAG HYDROXIDE-SIMETH 200-200-20 MG/5ML PO SUSP: 30.0000 mL | ORAL | Status: DC | PRN

## 2022-05-13 MED ORDER — CEFAZOLIN SODIUM-DEXTROSE 2-4 GM/100ML-% IV SOLN
2.0000 g | Freq: Four times a day (QID) | INTRAVENOUS | Status: AC
  Administered 2022-05-13 – 2022-05-14 (×2): 2 g via INTRAVENOUS
  Filled 2022-05-13 (×3): qty 100

## 2022-05-13 MED ORDER — ACETAMINOPHEN 500 MG PO TABS
1000.0000 mg | ORAL_TABLET | Freq: Four times a day (QID) | ORAL | Status: DC
Start: 2022-05-13 — End: 2022-05-14
  Administered 2022-05-13 – 2022-05-14 (×3): 1000 mg via ORAL
  Filled 2022-05-13 (×4): qty 2

## 2022-05-13 MED ORDER — ACETAMINOPHEN 500 MG PO TABS
ORAL_TABLET | ORAL | Status: AC
  Administered 2022-05-13: 1000 mg via ORAL
  Filled 2022-05-13: qty 2

## 2022-05-13 MED ORDER — SODIUM CHLORIDE 0.9 % IR SOLN
Status: DC | PRN
  Administered 2022-05-13: 1000 mL

## 2022-05-13 MED ORDER — PHENYLEPHRINE HCL-NACL 20-0.9 MG/250ML-% IV SOLN
INTRAVENOUS | Status: DC | PRN
  Administered 2022-05-13: 25 ug/min via INTRAVENOUS

## 2022-05-13 MED ORDER — DOCUSATE SODIUM 100 MG PO CAPS
100.0000 mg | ORAL_CAPSULE | Freq: Two times a day (BID) | ORAL | Status: DC
  Administered 2022-05-13 – 2022-05-14 (×2): 100 mg via ORAL
  Filled 2022-05-13 (×2): qty 1

## 2022-05-13 MED ORDER — 0.9 % SODIUM CHLORIDE (POUR BTL) OPTIME
TOPICAL | Status: DC | PRN
  Administered 2022-05-13: 1000 mL

## 2022-05-13 MED ORDER — ORAL CARE MOUTH RINSE: 15.0000 mL | Freq: Once | OROMUCOSAL | Status: AC

## 2022-05-13 MED ORDER — CEFAZOLIN SODIUM-DEXTROSE 2-4 GM/100ML-% IV SOLN
2.0000 g | INTRAVENOUS | Status: AC
  Administered 2022-05-13: 2 g via INTRAVENOUS

## 2022-05-13 MED ORDER — MENTHOL 3 MG MT LOZG: 1.0000 | LOZENGE | OROMUCOSAL | Status: DC | PRN

## 2022-05-13 MED ORDER — GABAPENTIN 100 MG PO CAPS
200.0000 mg | ORAL_CAPSULE | Freq: Every day | ORAL | Status: DC
  Administered 2022-05-13: 200 mg via ORAL
  Filled 2022-05-13: qty 2

## 2022-05-13 MED ORDER — HYDROMORPHONE HCL 1 MG/ML IJ SOLN
INTRAMUSCULAR | Status: AC
  Filled 2022-05-13: qty 1

## 2022-05-13 MED ORDER — BUPIVACAINE-MELOXICAM ER 400-12 MG/14ML IJ SOLN
INTRAMUSCULAR | Status: DC | PRN
  Administered 2022-05-13: 400 mg

## 2022-05-13 MED ORDER — OXYCODONE HCL 5 MG PO TABS: 5.0000 mg | ORAL_TABLET | Freq: Once | ORAL | Status: DC | PRN

## 2022-05-13 MED ORDER — HYDROMORPHONE HCL 1 MG/ML IJ SOLN: 0.5000 mg | INTRAMUSCULAR | Status: DC | PRN

## 2022-05-13 MED ORDER — LOSARTAN POTASSIUM 50 MG PO TABS
100.0000 mg | ORAL_TABLET | Freq: Every day | ORAL | Status: DC
  Administered 2022-05-14: 100 mg via ORAL
  Filled 2022-05-13: qty 2

## 2022-05-13 MED ORDER — PANTOPRAZOLE SODIUM 40 MG PO TBEC
40.0000 mg | DELAYED_RELEASE_TABLET | Freq: Every day | ORAL | Status: DC
  Administered 2022-05-13 – 2022-05-14 (×2): 40 mg via ORAL
  Filled 2022-05-13 (×2): qty 1

## 2022-05-13 MED ORDER — ACETAMINOPHEN 500 MG PO TABS: 1000.0000 mg | ORAL_TABLET | Freq: Once | ORAL | Status: AC

## 2022-05-13 MED ORDER — LOSARTAN POTASSIUM-HCTZ 100-25 MG PO TABS: 1.0000 | ORAL_TABLET | Freq: Every day | ORAL | Status: DC

## 2022-05-13 MED ORDER — ONDANSETRON HCL 4 MG/2ML IJ SOLN
INTRAMUSCULAR | Status: AC
  Filled 2022-05-13: qty 4

## 2022-05-13 MED ORDER — BUPIVACAINE-MELOXICAM ER 400-12 MG/14ML IJ SOLN
INTRAMUSCULAR | Status: AC
  Filled 2022-05-13: qty 1

## 2022-05-13 MED ORDER — TRANEXAMIC ACID-NACL 1000-0.7 MG/100ML-% IV SOLN
1000.0000 mg | INTRAVENOUS | Status: AC
  Administered 2022-05-13: 1000 mg via INTRAVENOUS

## 2022-05-13 MED ORDER — PROPOFOL 10 MG/ML IV BOLUS
INTRAVENOUS | Status: DC | PRN
  Administered 2022-05-13: 20 mg via INTRAVENOUS
  Administered 2022-05-13: 30 mg via INTRAVENOUS

## 2022-05-13 MED ORDER — FENTANYL CITRATE (PF) 250 MCG/5ML IJ SOLN
INTRAMUSCULAR | Status: AC
  Filled 2022-05-13: qty 5

## 2022-05-13 MED ORDER — PHENYLEPHRINE 80 MCG/ML (10ML) SYRINGE FOR IV PUSH (FOR BLOOD PRESSURE SUPPORT)
PREFILLED_SYRINGE | INTRAVENOUS | Status: DC | PRN
Start: 2022-05-13 — End: 2022-05-13
  Administered 2022-05-13 (×3): 80 ug via INTRAVENOUS

## 2022-05-13 MED ORDER — ONDANSETRON HCL 4 MG/2ML IJ SOLN: 4.0000 mg | Freq: Four times a day (QID) | INTRAMUSCULAR | Status: DC | PRN

## 2022-05-13 MED ORDER — LEVOTHYROXINE SODIUM 75 MCG PO TABS
75.0000 ug | ORAL_TABLET | Freq: Every day | ORAL | Status: DC
  Administered 2022-05-14: 75 ug via ORAL
  Filled 2022-05-13: qty 1

## 2022-05-13 MED ORDER — ONDANSETRON HCL 4 MG PO TABS: 4.0000 mg | ORAL_TABLET | Freq: Four times a day (QID) | ORAL | Status: DC | PRN

## 2022-05-13 MED ORDER — METHOTREXATE 2.5 MG PO TABS: 20.0000 mg | ORAL_TABLET | ORAL | Status: DC

## 2022-05-13 MED ORDER — LACTATED RINGERS IV SOLN: INTRAVENOUS | Status: DC

## 2022-05-13 MED ORDER — PROPOFOL 500 MG/50ML IV EMUL
INTRAVENOUS | Status: DC | PRN
  Administered 2022-05-13: 75 ug/kg/min via INTRAVENOUS

## 2022-05-13 MED ORDER — TRANEXAMIC ACID-NACL 1000-0.7 MG/100ML-% IV SOLN
INTRAVENOUS | Status: AC
  Filled 2022-05-13: qty 100

## 2022-05-13 MED ORDER — HYDROCHLOROTHIAZIDE 25 MG PO TABS
25.0000 mg | ORAL_TABLET | Freq: Every day | ORAL | Status: DC
  Administered 2022-05-14: 25 mg via ORAL
  Filled 2022-05-13: qty 1

## 2022-05-13 MED ORDER — POLYETHYLENE GLYCOL 3350 17 G PO PACK
17.0000 g | PACK | Freq: Every day | ORAL | Status: DC
  Administered 2022-05-13 – 2022-05-14 (×2): 17 g via ORAL
  Filled 2022-05-13 (×2): qty 1

## 2022-05-13 MED ORDER — OXYCODONE HCL 5 MG PO TABS: 5.0000 mg | ORAL_TABLET | ORAL | Status: DC | PRN

## 2022-05-13 MED ORDER — PHENYLEPHRINE 80 MCG/ML (10ML) SYRINGE FOR IV PUSH (FOR BLOOD PRESSURE SUPPORT)
PREFILLED_SYRINGE | INTRAVENOUS | Status: AC
  Filled 2022-05-13: qty 10

## 2022-05-13 MED ORDER — CHLORHEXIDINE GLUCONATE 0.12 % MT SOLN
15.0000 mL | Freq: Once | OROMUCOSAL | Status: AC
  Administered 2022-05-13: 15 mL via OROMUCOSAL
  Filled 2022-05-13: qty 15

## 2022-05-13 MED ORDER — ASPIRIN 81 MG PO CHEW
81.0000 mg | CHEWABLE_TABLET | Freq: Two times a day (BID) | ORAL | Status: DC
  Administered 2022-05-14: 81 mg via ORAL
  Filled 2022-05-13: qty 1

## 2022-05-13 MED ORDER — METOCLOPRAMIDE HCL 5 MG/ML IJ SOLN: 5.0000 mg | Freq: Three times a day (TID) | INTRAMUSCULAR | Status: DC | PRN

## 2022-05-13 MED ORDER — PHENOL 1.4 % MT LIQD: 1.0000 | OROMUCOSAL | Status: DC | PRN

## 2022-05-13 MED ORDER — CEFAZOLIN SODIUM-DEXTROSE 2-4 GM/100ML-% IV SOLN
INTRAVENOUS | Status: AC
  Filled 2022-05-13: qty 100

## 2022-05-13 MED ORDER — DEXAMETHASONE SODIUM PHOSPHATE 10 MG/ML IJ SOLN
10.0000 mg | Freq: Once | INTRAMUSCULAR | Status: AC
  Administered 2022-05-14: 10 mg via INTRAVENOUS
  Filled 2022-05-13: qty 1

## 2022-05-13 MED ORDER — POVIDONE-IODINE 10 % EX SWAB
2.0000 "application " | Freq: Once | CUTANEOUS | Status: AC
  Administered 2022-05-13: 2 via TOPICAL

## 2022-05-13 MED ORDER — VANCOMYCIN HCL 1 G IV SOLR
INTRAVENOUS | Status: DC | PRN
  Administered 2022-05-13: 1000 mg via TOPICAL

## 2022-05-13 MED ORDER — TRANEXAMIC ACID 1000 MG/10ML IV SOLN
INTRAVENOUS | Status: DC | PRN
  Administered 2022-05-13: 2000 mg via TOPICAL

## 2022-05-13 MED ORDER — SORBITOL 70 % SOLN: 30.0000 mL | Freq: Every day | Status: DC | PRN

## 2022-05-13 MED ORDER — AMISULPRIDE (ANTIEMETIC) 5 MG/2ML IV SOLN: 10.0000 mg | Freq: Once | INTRAVENOUS | Status: DC | PRN

## 2022-05-13 MED ORDER — METHOCARBAMOL 500 MG PO TABS
500.0000 mg | ORAL_TABLET | Freq: Four times a day (QID) | ORAL | Status: DC | PRN
  Administered 2022-05-13: 500 mg via ORAL
  Filled 2022-05-13: qty 1

## 2022-05-13 MED ORDER — OXYCODONE HCL 5 MG/5ML PO SOLN: 5.0000 mg | Freq: Once | ORAL | Status: DC | PRN

## 2022-05-13 MED ORDER — OXYCODONE HCL ER 10 MG PO T12A
10.0000 mg | EXTENDED_RELEASE_TABLET | Freq: Two times a day (BID) | ORAL | Status: DC
  Administered 2022-05-13 – 2022-05-14 (×2): 10 mg via ORAL
  Filled 2022-05-13 (×2): qty 1

## 2022-05-13 MED ORDER — FENTANYL CITRATE (PF) 250 MCG/5ML IJ SOLN
INTRAMUSCULAR | Status: DC | PRN
  Administered 2022-05-13: 50 ug via INTRAVENOUS

## 2022-05-13 MED ORDER — ACETAMINOPHEN 325 MG PO TABS: 325.0000 mg | ORAL_TABLET | Freq: Four times a day (QID) | ORAL | Status: DC | PRN

## 2022-05-13 MED ORDER — DIPHENHYDRAMINE HCL 12.5 MG/5ML PO ELIX: 25.0000 mg | ORAL_SOLUTION | ORAL | Status: DC | PRN

## 2022-05-13 SURGICAL SUPPLY — 61 items
ADH SKN CLS APL DERMABOND .7 (GAUZE/BANDAGES/DRESSINGS) ×1
BAG COUNTER SPONGE SURGICOUNT (BAG) ×2 IMPLANT
BAG DECANTER FOR FLEXI CONT (MISCELLANEOUS) ×2 IMPLANT
COVER PERINEAL POST (MISCELLANEOUS) ×2 IMPLANT
COVER SURGICAL LIGHT HANDLE (MISCELLANEOUS) ×2 IMPLANT
CUP SECTOR GRIPTON 50MM (Cup) ×1 IMPLANT
DERMABOND ADVANCED (GAUZE/BANDAGES/DRESSINGS) ×1
DERMABOND ADVANCED .7 DNX12 (GAUZE/BANDAGES/DRESSINGS) IMPLANT
DRAPE C-ARM 42X72 X-RAY (DRAPES) ×2 IMPLANT
DRAPE POUCH INSTRU U-SHP 10X18 (DRAPES) ×2 IMPLANT
DRAPE STERI IOBAN 125X83 (DRAPES) ×2 IMPLANT
DRAPE U-SHAPE 47X51 STRL (DRAPES) ×4 IMPLANT
DRSG AQUACEL AG ADV 3.5X10 (GAUZE/BANDAGES/DRESSINGS) ×2 IMPLANT
DURAPREP 26ML APPLICATOR (WOUND CARE) ×4 IMPLANT
ELECT BLADE 4.0 EZ CLEAN MEGAD (MISCELLANEOUS) ×2
ELECT REM PT RETURN 9FT ADLT (ELECTROSURGICAL) ×2
ELECTRODE BLDE 4.0 EZ CLN MEGD (MISCELLANEOUS) ×1 IMPLANT
ELECTRODE REM PT RTRN 9FT ADLT (ELECTROSURGICAL) ×1 IMPLANT
GLOVE BIOGEL PI IND STRL 7.0 (GLOVE) ×1 IMPLANT
GLOVE BIOGEL PI IND STRL 7.5 (GLOVE) ×4 IMPLANT
GLOVE BIOGEL PI INDICATOR 7.0 (GLOVE) ×1
GLOVE BIOGEL PI INDICATOR 7.5 (GLOVE) ×4
GLOVE ECLIPSE 7.0 STRL STRAW (GLOVE) ×4 IMPLANT
GLOVE SKINSENSE NS SZ7.5 (GLOVE) ×1
GLOVE SKINSENSE STRL SZ7.5 (GLOVE) ×1 IMPLANT
GLOVE SURG UNDER POLY LF SZ7 (GLOVE) ×2 IMPLANT
GLOVE SURG UNDER POLY LF SZ7.5 (GLOVE) ×4 IMPLANT
GOWN STRL REIN XL XLG (GOWN DISPOSABLE) ×2 IMPLANT
GOWN STRL REUS W/ TWL LRG LVL3 (GOWN DISPOSABLE) IMPLANT
GOWN STRL REUS W/ TWL XL LVL3 (GOWN DISPOSABLE) ×1 IMPLANT
GOWN STRL REUS W/TWL LRG LVL3 (GOWN DISPOSABLE)
GOWN STRL REUS W/TWL XL LVL3 (GOWN DISPOSABLE) ×2
HANDPIECE INTERPULSE COAX TIP (DISPOSABLE) ×2
HEAD FEM STD 32X+1 STRL (Hips) ×1 IMPLANT
HOOD PEEL AWAY FLYTE STAYCOOL (MISCELLANEOUS) ×4 IMPLANT
IV NS IRRIG 3000ML ARTHROMATIC (IV SOLUTION) ×2 IMPLANT
KIT BASIN OR (CUSTOM PROCEDURE TRAY) ×2 IMPLANT
LINER ACET PNNCL PLUS4 NEUTRAL (Hips) IMPLANT
MARKER SKIN DUAL TIP RULER LAB (MISCELLANEOUS) ×3 IMPLANT
NDL SPNL 18GX3.5 QUINCKE PK (NEEDLE) ×1 IMPLANT
NEEDLE SPNL 18GX3.5 QUINCKE PK (NEEDLE) ×2 IMPLANT
PACK TOTAL JOINT (CUSTOM PROCEDURE TRAY) ×2 IMPLANT
PACK UNIVERSAL I (CUSTOM PROCEDURE TRAY) ×2 IMPLANT
PINNACLE PLUS 4 NEUTRAL (Hips) ×2 IMPLANT
SAW OSC TIP CART 19.5X105X1.3 (SAW) ×2 IMPLANT
SCREW 6.5MMX30MM (Screw) ×1 IMPLANT
SET HNDPC FAN SPRY TIP SCT (DISPOSABLE) ×1 IMPLANT
SOLUTION PRONTOSAN WOUND 350ML (IRRIGATION / IRRIGATOR) ×1 IMPLANT
STEM FEMORAL SZ 5MM STD ACTIS (Stem) ×1 IMPLANT
SUT ETHIBOND 2 V 37 (SUTURE) ×2 IMPLANT
SUT ETHILON 2 0 FS 18 (SUTURE) ×1 IMPLANT
SUT VIC AB 0 CT1 27 (SUTURE) ×2
SUT VIC AB 0 CT1 27XBRD ANBCTR (SUTURE) ×1 IMPLANT
SUT VIC AB 1 CTX 36 (SUTURE) ×2
SUT VIC AB 1 CTX36XBRD ANBCTR (SUTURE) ×1 IMPLANT
SUT VIC AB 2-0 CT1 27 (SUTURE) ×4
SUT VIC AB 2-0 CT1 TAPERPNT 27 (SUTURE) ×2 IMPLANT
SYR 50ML LL SCALE MARK (SYRINGE) ×2 IMPLANT
TOWEL GREEN STERILE (TOWEL DISPOSABLE) ×2 IMPLANT
TRAY FOLEY W/BAG SLVR 14FR (SET/KITS/TRAYS/PACK) ×1 IMPLANT
YANKAUER SUCT BULB TIP NO VENT (SUCTIONS) ×2 IMPLANT

## 2022-05-13 NOTE — Anesthesia Procedure Notes (Signed)
Procedure Name: MAC Date/Time: 05/13/2022 12:30 PM Performed by: Carolan Clines, CRNA Pre-anesthesia Checklist: Patient identified, Emergency Drugs available, Suction available and Patient being monitored Patient Re-evaluated:Patient Re-evaluated prior to induction Oxygen Delivery Method: Simple face mask Dental Injury: Teeth and Oropharynx as per pre-operative assessment

## 2022-05-13 NOTE — Discharge Instructions (Signed)

## 2022-05-13 NOTE — Op Note (Signed)
RIGHT TOTAL HIP ARTHROPLASTY ANTERIOR APPROACH  Procedure Note Allison Whitaker   [id number]  Pre-op Diagnosis: right hip osteoarthritis     Post-op Diagnosis: same   Operative Procedures  1. Total hip replacement; Right hip; uncemented [id number]   Surgeon: Frankey Shown, M.D.  Assist: Madalyn Rob, PA-C   Anesthesia: spinal  Prosthesis: Depuy Acetabulum: Pinnacle 50 mm Femur: Actis 5 STD Head: 32 mm size: +1 Liner: +4 Bearing Type: metal/poly  Total Hip Arthroplasty (Anterior Approach) Op Note:  After informed consent was obtained and the operative extremity marked in the holding area, the patient was brought back to the operating room and placed supine on the HANA table. Next, the operative extremity was prepped and draped in normal sterile fashion. Surgical timeout occurred verifying patient identification, surgical site, surgical procedure and administration of antibiotics.  A modified anterior Smith-Peterson approach to the hip was performed, using the interval between tensor fascia lata and sartorius.  Dissection was carried bluntly down onto the anterior hip capsule. The lateral femoral circumflex vessels were identified and coagulated. A capsulotomy was performed and the capsular flaps tagged for later repair.  The neck osteotomy was performed. The femoral head was removed which showed severe wear, the acetabular rim was cleared of soft tissue and osteophytes and attention was turned to reaming the acetabulum.  Sequential reaming was performed under fluoroscopic guidance. We reamed to a size 49 mm, and then impacted the acetabular shell. A 30 mm cancellous screw was placed through the shell for added fixation.  The liner was then placed after irrigation and attention turned to the femur.  After placing the femoral hook, the leg was taken to externally rotated, extended and adducted position taking care to perform soft tissue releases to allow for adequate mobilization of the  femur. Soft tissue was cleared from the shoulder of the greater trochanter and the hook elevator used to improve exposure of the proximal femur. Sequential broaching performed up to a size 5. Trial neck and head were placed. The leg was brought back up to neutral and the construct reduced.  Antibiotic irrigation was placed in the surgical wound.  The position and sizing of components, offset and leg lengths were checked using fluoroscopy. Stability of the construct was checked in extension and external rotation without any subluxation or impingement of prosthesis. We dislocated the prosthesis, dropped the leg back into position, removed trial components, and irrigated copiously. The final stem and head was then placed, the leg brought back up, the system reduced and fluoroscopy used to verify positioning.  We irrigated, obtained hemostasis and closed the capsule using #2 ethibond suture.  One gram of vancomycin powder was placed in the surgical bed.   One gram of topical tranexamic acid was injected into the joint.  The fascia was closed with #1 vicryl plus, the deep fat layer was closed with 0 vicryl, the subcutaneous layers closed with 2.0 Vicryl Plus and the skin closed with 2.0 nylon and dermabond. A sterile dressing was applied. The patient was awakened in the operating room and taken to recovery in stable condition.  All sponge, needle, and instrument counts were correct at the end of the case.   Tawanna Cooler, my PA, was a medical necessity for opening, closing, limb positioning, retracting, exposing, and overall facilitation and timely completion of the surgery.  Position: supine  Complications: see description of procedure.  Time Out: performed   Drains/Packing: none  Estimated blood loss: see anesthesia record  Returned to  Recovery Room: in good condition.   Antibiotics: yes   Mechanical VTE (DVT) Prophylaxis: sequential compression devices, TED thigh-high  Chemical VTE (DVT)  Prophylaxis: aspirin   Fluid Replacement: see anesthesia record  Specimens Removed: 1 to pathology   Sponge and Instrument Count Correct? yes   PACU: portable radiograph - low AP   Plan/RTC: Return in 2 weeks for staple removal. Weight Bearing/Load Lower Extremity: full  Hip precautions: none Suture Removal: 2 weeks   N. Eduard Roux, MD Marga Hoots 1:50 PM   Implant Name Type Inv. Item Serial No. Manufacturer Lot No. LRB No. Used Action  CUP SECTOR GRIPTON 50MM - [id number] Cup CUP SECTOR GRIPTON 50MM  [employer] [id number] Right 1 Implanted  SCREW 6.5MMX30MM - [id number] Screw SCREW 6.5MMX30MM  [employer] [id number] Right 1 Implanted  PINNACLE PLUS 4 NEUTRAL - [id number] Hips PINNACLE PLUS 4 NEUTRAL  [employer] [id number] Right 1 Implanted  STEM FEMORAL SZ 5MM STD ACTIS - [id number] Stem STEM FEMORAL SZ 5MM STD ACTIS  [employer] [id number] Right 1 Implanted  HEAD FEM STD 32X+1 STRL - [id number] Hips HEAD FEM STD 32X+1 STRL  [employer] [id number] Right 1 Implanted

## 2022-05-13 NOTE — Anesthesia Preprocedure Evaluation (Addendum)
Anesthesia Evaluation  Patient identified by MRN, date of birth, ID band Patient awake    Reviewed: Allergy & Precautions, NPO status , Patient's Chart, lab work & pertinent test results  Airway Mallampati: III  TM Distance: >3 FB Neck ROM: Full    Dental  (+) Dental Advisory Given, Chipped,    Pulmonary neg pulmonary ROS,    Pulmonary exam normal breath sounds clear to auscultation       Cardiovascular hypertension (151/82 in preop, per pt normally 140s SBP), Pt. on medications Normal cardiovascular exam Rhythm:Regular Rate:Normal     Neuro/Psych negative neurological ROS  negative psych ROS   GI/Hepatic Neg liver ROS, GERD  Medicated and Controlled,  Endo/Other  Hypothyroidism Obesity BMI 34  Renal/GU negative Renal ROS  negative genitourinary   Musculoskeletal  (+) Arthritis , Osteoarthritis and Rheumatoid disorders,    Abdominal (+) + obese,   Peds  Hematology negative hematology ROS (+) hct 37, plt 276   Anesthesia Other Findings   Reproductive/Obstetrics negative OB ROS                            Anesthesia Physical Anesthesia Plan  ASA: 2  Anesthesia Plan: Spinal and MAC   Post-op Pain Management: Tylenol PO (pre-op)*   Induction:   PONV Risk Score and Plan: 2 and Propofol infusion and TIVA  Airway Management Planned: Natural Airway and Nasal Cannula  Additional Equipment: None  Intra-op Plan:   Post-operative Plan:   Informed Consent: I have reviewed the patients History and Physical, chart, labs and discussed the procedure including the risks, benefits and alternatives for the proposed anesthesia with the patient or authorized representative who has indicated his/her understanding and acceptance.     Dental advisory given  Plan Discussed with: CRNA  Anesthesia Plan Comments:        Anesthesia Quick Evaluation

## 2022-05-13 NOTE — H&P (Signed)
PREOPERATIVE H&P  Chief Complaint: right hip osteoarthritis  HPI: Allison Whitaker is a 81 y.o. female who presents for surgical treatment of right hip osteoarthritis.  She denies any changes in medical history.  Past Medical History:  Diagnosis Date   Arthritis    Rheumatoid    GERD (gastroesophageal reflux disease)    Hypertension    Hypothyroidism    takes synthroid   Rheumatoid arthritis(714.0)    Thyroid disease    Past Surgical History:  Procedure Laterality Date   OOPHORECTOMY  12/23/1981   WISDOM TOOTH EXTRACTION     all 4 teeth removed when she was in her 15s   WRIST SURGERY Right 12/24/2007   for tendonitis   Social History   Socioeconomic History   Marital status: Married    Spouse name: Not on file   Number of children: Not on file   Years of education: Not on file   Highest education level: Not on file  Occupational History   Not on file  Tobacco Use   Smoking status: Never   Smokeless tobacco: Never  Vaping Use   Vaping Use: Never used  Substance and Sexual Activity   Alcohol use: Yes    Alcohol/week: 1.0 - 2.0 standard drink    Types: 1 - 2 Glasses of wine per week   Drug use: No   Sexual activity: Not on file  Other Topics Concern   Not on file  Social History Narrative   Not on file   Social Determinants of Health   Financial Resource Strain: Not on file  Food Insecurity: Not on file  Transportation Needs: Not on file  Physical Activity: Not on file  Stress: Not on file  Social Connections: Not on file   Family History  Problem Relation Age of Onset   Stroke Mother    Colon cancer Neg Hx    Allergies  Allergen Reactions   Cephalexin Rash    Pt unsure of this   Sulfa Antibiotics Itching and Rash   Prior to Admission medications   Medication Sig Start Date End Date Taking? Authorizing Provider  aspirin EC 81 MG tablet Take 1 tablet (81 mg total) by mouth 2 (two) times daily. To be taken after surgery to prevent blood clots  05/06/22   Aundra Dubin, PA-C  Cholecalciferol (VITAMIN D) 50 MCG (2000 UT) tablet Take 2,000 Units by mouth daily.   Yes [provider]  docusate sodium (COLACE) 100 MG capsule Take 1 capsule (100 mg total) by mouth daily as needed. 05/06/22 05/06/23  Aundra Dubin, PA-C  folic acid (FOLVITE) 1 MG tablet Take 3 mg by mouth daily.   Yes [provider]  gabapentin (NEURONTIN) 100 MG capsule Take 200 mg by mouth at bedtime. 04/17/22  Yes [provider]  levothyroxine (SYNTHROID, LEVOTHROID) 75 MCG tablet Take 75 mcg by mouth daily.   Yes [provider]  losartan-hydrochlorothiazide (HYZAAR) 100-25 MG tablet Take 1 tablet by mouth daily. 02/26/21  Yes [provider]  methocarbamol (ROBAXIN) 500 MG tablet Take 1 tablet (500 mg total) by mouth 2 (two) times daily as needed for muscle spasms. To be taken after surgery 05/06/22   Aundra Dubin, PA-C  methotrexate (RHEUMATREX) 2.5 MG tablet Take 20 mg by mouth every Tuesday. Caution:Chemotherapy. Protect from light. Takes 8  tabs once per week.   Yes [provider]  Multiple Vitamin (MULTIVITAMIN) tablet Take 1 tablet by mouth daily.  Yes [provider]  omeprazole (PRILOSEC) 20 MG capsule Take 20 mg by mouth daily.   Yes [provider]  ondansetron (ZOFRAN) 4 MG tablet Take 1 tablet (4 mg total) by mouth every 8 (eight) hours as needed for nausea or vomiting. 05/06/22   Aundra Dubin, PA-C  oxyCODONE-acetaminophen (PERCOCET) 5-325 MG tablet Take 1-2 tablets by mouth every 6 (six) hours as needed. To be taken after surgery 05/06/22   Nathaniel Man  Vitamin A 2400 MCG (8000 UT) CAPS Take 8,000 Units by mouth daily. 11/23/19  Yes [provider]     Positive ROS: All other systems have been reviewed and were otherwise negative with the exception of those mentioned in the HPI and as above.  Physical Exam: General: Alert, no acute distress Cardiovascular:  No pedal edema Respiratory: No cyanosis, no use of accessory musculature GI: abdomen soft Skin: No lesions in the area of chief complaint Neurologic: Sensation intact distally Psychiatric: Patient is competent for consent with normal mood and affect Lymphatic: no lymphedema  MUSCULOSKELETAL: exam stable  Assessment: right hip osteoarthritis  Plan: Plan for Procedure(s): RIGHT TOTAL HIP ARTHROPLASTY ANTERIOR APPROACH  The risks benefits and alternatives were discussed with the patient including but not limited to the risks of nonoperative treatment, versus surgical intervention including infection, bleeding, nerve injury,  blood clots, cardiopulmonary complications, morbidity, mortality, among others, and they were willing to proceed.   Preoperative templating of the joint replacement has been completed, documented, and submitted to the Operating Room personnel in order to optimize intra-operative equipment management.   Eduard Roux, MD 05/13/2022 10:38 AM

## 2022-05-13 NOTE — Anesthesia Postprocedure Evaluation (Signed)
Anesthesia Post Note  Patient: SIRIYAH AMBROSIUS  Procedure(s) Performed: RIGHT TOTAL HIP ARTHROPLASTY ANTERIOR APPROACH (Right: Hip)     Patient location during evaluation: PACU Anesthesia Type: MAC and Spinal Level of consciousness: awake and alert and oriented Pain management: pain level controlled Vital Signs Assessment: post-procedure vital signs reviewed and stable Respiratory status: spontaneous breathing, nonlabored ventilation and respiratory function stable Cardiovascular status: blood pressure returned to baseline and stable Postop Assessment: no headache, no backache, spinal receding and patient able to bend at knees Anesthetic complications: no   No notable events documented.  Last Vitals:  Vitals:   05/13/22 1500 05/13/22 1515  BP: 104/63 118/65  Pulse: (!) 50 (!) 50  Resp: 15 20  Temp:    SpO2: 99% 100%    Last Pain:  Vitals:   05/13/22 1500  TempSrc:   PainSc: 0-No pain                 Pervis Hocking

## 2022-05-13 NOTE — Anesthesia Procedure Notes (Signed)
Spinal  Patient location during procedure: OR Start time: 05/13/2022 12:23 PM End time: 05/13/2022 12:27 PM Reason for block: surgical anesthesia Staffing Performed: anesthesiologist  Anesthesiologist: Pervis Hocking, DO Preanesthetic Checklist Completed: patient identified, IV checked, risks and benefits discussed, surgical consent, monitors and equipment checked, pre-op evaluation and timeout performed Spinal Block Patient position: sitting Prep: DuraPrep and site prepped and draped Patient monitoring: cardiac monitor, continuous pulse ox and blood pressure Approach: midline Location: L3-4 Injection technique: single-shot Needle Needle type: Pencan  Needle gauge: 24 G Needle length: 9 cm Assessment Sensory level: T6 Events: CSF return Additional Notes Functioning IV was confirmed and monitors were applied. Sterile prep and drape, including hand hygiene and sterile gloves were used. The patient was positioned and the spine was prepped. The skin was anesthetized with lidocaine.  Free flow of clear CSF was obtained prior to injecting local anesthetic into the CSF.  The spinal needle aspirated freely following injection.  The needle was carefully withdrawn.  The patient tolerated the procedure well.

## 2022-05-13 NOTE — Transfer of Care (Signed)
Immediate Anesthesia Transfer of Care Note  Patient: Allison Whitaker  Procedure(s) Performed: RIGHT TOTAL HIP ARTHROPLASTY ANTERIOR APPROACH (Right: Hip)  Patient Location: PACU  Anesthesia Type:MAC and Spinal  Level of Consciousness: awake, alert  and oriented  Airway & Oxygen Therapy: Patient Spontanous Breathing  Post-op Assessment: Report given to RN and Post -op Vital signs reviewed and stable  Post vital signs: Reviewed and stable  Last Vitals:  Vitals Value Taken Time  BP 108/58 05/13/22 1427  Temp    Pulse 59 05/13/22 1428  Resp 15 05/13/22 1428  SpO2 98 % 05/13/22 1428  Vitals shown include unvalidated device data.  Last Pain:  Vitals:   05/13/22 1002  TempSrc:   PainSc: 0-No pain         Complications: No notable events documented.

## 2022-05-14 ENCOUNTER — Observation Stay (HOSPITAL_BASED_OUTPATIENT_CLINIC_OR_DEPARTMENT_OTHER): Payer: Medicare Other

## 2022-05-14 DIAGNOSIS — M79662 Pain in left lower leg: Secondary | ICD-10-CM | POA: Diagnosis not present

## 2022-05-14 DIAGNOSIS — M1611 Unilateral primary osteoarthritis, right hip: Secondary | ICD-10-CM | POA: Diagnosis not present

## 2022-05-14 LAB — CBC
HCT: 30.6 % — ABNORMAL LOW (ref 36.0–46.0)
Hemoglobin: 9.9 g/dL — ABNORMAL LOW (ref 12.0–15.0)
MCH: 29.6 pg (ref 26.0–34.0)
MCHC: 32.4 g/dL (ref 30.0–36.0)
MCV: 91.3 fL (ref 80.0–100.0)
Platelets: 206 10*3/uL (ref 150–400)
RBC: 3.35 MIL/uL — ABNORMAL LOW (ref 3.87–5.11)
RDW: 14.6 % (ref 11.5–15.5)
WBC: 7.6 10*3/uL (ref 4.0–10.5)
nRBC: 0 % (ref 0.0–0.2)

## 2022-05-14 NOTE — Discharge Summary (Signed)
Patient ID: Allison Whitaker MRN: [id number] DOB/AGE: 79-19-1942 79 y.o.  Admit date: 05/13/2022 Discharge date: 05/14/2022  Admission Diagnoses:  Principal Problem:   Primary osteoarthritis of right hip Active Problems:   Status post total replacement of right hip   Discharge Diagnoses:  Same  Past Medical History:  Diagnosis Date   Arthritis    Rheumatoid    GERD (gastroesophageal reflux disease)    Hypertension    Hypothyroidism    takes synthroid   Rheumatoid arthritis(714.0)    Thyroid disease     Surgeries: Procedure(s): RIGHT TOTAL HIP ARTHROPLASTY ANTERIOR APPROACH on 05/13/2022   Consultants:   Discharged Condition: Improved  Hospital Course: Allison Whitaker is an 81 y.o. female who was admitted 05/13/2022 for operative treatment ofPrimary osteoarthritis of right hip. Patient has severe unremitting pain that affects sleep, daily activities, and work/hobbies. After pre-op clearance the patient was taken to the operating room on 05/13/2022 and underwent  Procedure(s): RIGHT TOTAL HIP ARTHROPLASTY ANTERIOR APPROACH.    Patient was given perioperative antibiotics:  Anti-infectives (From admission, onward)    Start     Dose/Rate Route Frequency Ordered Stop   05/13/22 1900  ceFAZolin (ANCEF) IVPB 2g/100 mL premix        2 g 200 mL/hr over 30 Minutes Intravenous Every 6 hours 05/13/22 1653 05/14/22 0208   05/13/22 1308  vancomycin (VANCOCIN) powder  Status:  Discontinued          As needed 05/13/22 1308 05/13/22 1421   05/13/22 0945  ceFAZolin (ANCEF) IVPB 2g/100 mL premix        2 g 200 mL/hr over 30 Minutes Intravenous On call to O.R. 05/13/22 1224 05/13/22 1300   05/13/22 0943  ceFAZolin (ANCEF) 2-4 GM/100ML-% IVPB       Note to Pharmacy: Odelia Gage E: cabinet override      05/13/22 0943 05/13/22 1248        Patient was given sequential compression devices, early ambulation, and chemoprophylaxis to prevent DVT.  Patient benefited maximally from hospital  stay and there were no complications.    Recent vital signs: Patient Vitals for the past 24 hrs:  BP Temp Temp src Pulse Resp SpO2 Height Weight  05/14/22 0610 -- -- -- 60 13 95 % -- --  05/14/22 0300 103/61 98.7 F (37.1 C) Oral -- 12 95 % -- --  05/13/22 2158 112/73 98.6 F (37 C) Oral 64 15 96 % -- --  05/13/22 1630 (!) 146/70 -- -- 60 17 100 % -- --  05/13/22 1600 (!) 151/91 -- -- (!) 47 15 98 % -- --  05/13/22 1530 (!) 136/59 (!) 97.5 F (36.4 C) -- (!) 59 20 98 % -- --  05/13/22 1515 118/65 -- -- (!) 50 20 100 % -- --  05/13/22 1500 104/63 -- -- (!) 50 15 99 % -- --  05/13/22 1445 111/64 -- -- (!) 55 17 100 % -- --  05/13/22 1430 (!) 108/58 (!) 97.1 F (36.2 C) -- (!) 57 (!) 24 97 % -- --  05/13/22 0958 (!) 151/82 98.1 F (36.7 C) Oral 60 18 99 % '5\' 5"'$  (1.651 m) 90.7 kg     Recent laboratory studies:  Recent Labs    05/14/22 0209  WBC 7.6  HGB 9.9*  HCT 30.6*  PLT 206     Discharge Medications:   Allergies as of 05/14/2022       Reactions   Cephalexin Rash   Pt unsure of  this   Sulfa Antibiotics Itching, Rash        Medication List     TAKE these medications    aspirin EC 81 MG tablet Take 1 tablet (81 mg total) by mouth 2 (two) times daily. To be taken after surgery to prevent blood clots   docusate sodium 100 MG capsule Commonly known as: Colace Take 1 capsule (100 mg total) by mouth daily as needed.   folic acid 1 MG tablet Commonly known as: FOLVITE Take 3 mg by mouth daily.   gabapentin 100 MG capsule Commonly known as: NEURONTIN Take 200 mg by mouth at bedtime.   levothyroxine 75 MCG tablet Commonly known as: SYNTHROID Take 75 mcg by mouth daily.   losartan-hydrochlorothiazide 100-25 MG tablet Commonly known as: HYZAAR Take 1 tablet by mouth daily.   methocarbamol 500 MG tablet Commonly known as: ROBAXIN Take 1 tablet (500 mg total) by mouth 2 (two) times daily as needed for muscle spasms. To be taken after surgery   methotrexate  2.5 MG tablet Commonly known as: RHEUMATREX Take 20 mg by mouth every Tuesday. Caution:Chemotherapy. Protect from light. Takes 8  tabs once per week.   multivitamin tablet Take 1 tablet by mouth daily.   omeprazole 20 MG capsule Commonly known as: PRILOSEC Take 20 mg by mouth daily.   ondansetron 4 MG tablet Commonly known as: Zofran Take 1 tablet (4 mg total) by mouth every 8 (eight) hours as needed for nausea or vomiting.   oxyCODONE-acetaminophen 5-325 MG tablet Commonly known as: Percocet Take 1-2 tablets by mouth every 6 (six) hours as needed. To be taken after surgery   Vitamin A 2400 MCG (8000 UT) Caps Take 8,000 Units by mouth daily.   Vitamin D 50 MCG (2000 UT) tablet Take 2,000 Units by mouth daily.               Durable Medical Equipment  (From admission, onward)           Start     Ordered   05/13/22 1654  DME Walker rolling  Once       Question:  Patient needs a walker to treat with the following condition  Answer:  History of hip replacement   05/13/22 1653   05/13/22 1654  DME 3 n 1  Once        05/13/22 1653   05/13/22 1654  DME Bedside commode  Once       Question:  Patient needs a bedside commode to treat with the following condition  Answer:  History of hip replacement   05/13/22 1653            Diagnostic Studies: DG Pelvis Portable  Result Date: 05/13/2022 CLINICAL DATA:  Reason for exam: post op exam .Hip joint replacement status. 87983 EXAM: PORTABLE PELVIS 1-2 VIEWS COMPARISON:  04/09/2022 FINDINGS: Interval right hip arthroplasty using non cemented components. Single screw fixes the acetabular component. Components appear well seated. No acute fracture or dislocation. Degenerative changes in the left hip and lower lumbar spine. Soft tissue gas consistent with recent surgery. IMPRESSION: Right hip arthroplasty with components appearing well seated. No acute complication. Electronically Signed   By: Lucienne Capers M.D.   On:  05/13/2022 15:08   DG HIP UNILAT WITH PELVIS 1V RIGHT  Result Date: 05/13/2022 CLINICAL DATA:  Fluoroscopic assistance for right hip arthroplasty EXAM: DG HIP (WITH OR WITHOUT PELVIS) 1V RIGHT COMPARISON:  Radiographs done on 04/09/2022 FINDINGS: Fluoroscopic images show right hip  arthroplasty. No fracture is seen. Fluoroscopic time was 22 seconds. Radiation dose is 3.46 mGy. IMPRESSION: Fluoroscopic assistance was provided for right hip arthroplasty. Electronically Signed   By: Elmer Picker M.D.   On: 05/13/2022 14:04    Disposition: Discharge disposition: 01-Home or Self Care          Follow-up Information     Leandrew Koyanagi, MD. Schedule an appointment as soon as possible for a visit in 2 week(s).   Specialty: Orthopedic Surgery Contact information: [id number] [place] [address] [phone]                  Signed: Aundra Dubin 05/14/2022, 8:23 AM

## 2022-05-14 NOTE — Progress Notes (Signed)
Subjective: 1 Day Post-Op Procedure(s) (LRB): RIGHT TOTAL HIP ARTHROPLASTY ANTERIOR APPROACH (Right) Patient reports pain as mild.  Complaining of left calf pain which has been ongoing for a while.  Pcp started her on gabapentin which has not significantly helped.    Objective: Vital signs in last 24 hours: Temp:  [97.1 F (36.2 C)-98.7 F (37.1 C)] 98.7 F (37.1 C) (05/23 0300) Pulse Rate:  [47-64] 60 (05/23 0610) Resp:  [12-24] 13 (05/23 0610) BP: (103-151)/(58-91) 103/61 (05/23 0300) SpO2:  [95 %-100 %] 95 % (05/23 0610) Weight:  [90.7 kg] 90.7 kg (05/22 0958)  Intake/Output from previous day: 05/22 0701 - 05/23 0700 In: 1911 [P.O.:711; I.V.:1000; IV Piggyback:200] Out: 1100 [Urine:900; Blood:200] Intake/Output this shift: No intake/output data recorded.  Recent Labs    05/14/22 0209  HGB 9.9*   Recent Labs    05/14/22 0209  WBC 7.6  RBC 3.35*  HCT 30.6*  PLT 206   No results for input(s): NA, K, CL, CO2, BUN, CREATININE, GLUCOSE, CALCIUM in the last 72 hours. No results for input(s): LABPT, INR in the last 72 hours.  Neurologically intact Neurovascular intact Sensation intact distally Intact pulses distally Dorsiflexion/Plantar flexion intact Incision: dressing C/D/I No cellulitis present Compartment soft LLE- calf soft, but tender.  Negative homans.  No skin changes.   Assessment/Plan: 1 Day Post-Op Procedure(s) (LRB): RIGHT TOTAL HIP ARTHROPLASTY ANTERIOR APPROACH (Right) Advance diet Up with therapy D/C IV fluids WBAT RLE ABLA- mild and stable D/c home after second PT session as long as she has mobilized well with PT AND u/s complete and negative for dvt. LLE- u/s r/o dvt.  Suspicion is low for dvt, but I think this is warranted given current symptoms.      Aundra Dubin 05/14/2022, 8:20 AM

## 2022-05-14 NOTE — Progress Notes (Signed)
Physical Therapy Treatment Patient Details Name: Allison Whitaker MRN: [id number] DOB: 1941-09-15 Today's Date: 05/14/2022   History of Present Illness Pt is a 81 y.o. female s/p R THA, anterior approach. PMH: HTN, RA, hypothyroidism    PT Comments    Today's second session focused on stair training and HEP. Pt required min assist bed mobility, min guard assist transfers, and min guard assist ambulation with RW. Instructed on ascend/descend 2 steps forward with +1 HHA and backward with RW. She required min assist to complete stairs. Pt performed LE exercises in recliner with feet elevated. HEP handouts provided. Pt remained in recliner for lunch at end of session.    Recommendations for follow up therapy are one component of a multi-disciplinary discharge planning process, led by the attending physician.  Recommendations may be updated based on patient status, additional functional criteria and insurance authorization.  Follow Up Recommendations  Follow physician's recommendations for discharge plan and follow up therapies     Assistance Recommended at Discharge PRN  Patient can return home with the following Assistance with cooking/housework;A little help with bathing/dressing/bathroom;Assist for transportation;Help with stairs or ramp for entrance   Equipment Recommendations  None recommended by PT    Recommendations for Other Services       Precautions / Restrictions Precautions Precautions: Fall Restrictions Weight Bearing Restrictions: Yes RLE Weight Bearing: Weight bearing as tolerated     Mobility  Bed Mobility Overal bed mobility: Needs Assistance Bed Mobility: Supine to Sit     Supine to sit: Min assist, HOB elevated     General bed mobility comments: +rail, increased time, assist to elevate trunk. Pt reports she plans to sleep in recliner initially.    Transfers Overall transfer level: Needs assistance Equipment used: Rolling walker (2 wheels) Transfers: Sit  to/from Stand Sit to Stand: Min guard           General transfer comment: cues for hand placement and sequencing, min guard for safety    Ambulation/Gait Ambulation/Gait assistance: Min guard Gait Distance (Feet): 20 Feet (x 2) Assistive device: Rolling walker (2 wheels) Gait Pattern/deviations: Step-through pattern, Decreased stride length, Decreased weight shift to right, Decreased step length - left Gait velocity: decreased Gait velocity interpretation: <1.31 ft/sec, indicative of household ambulator   General Gait Details: min guard for safety. More steady this afternoon.   Stairs Stairs: Yes Stairs assistance: Min assist Stair Management: Backwards, Forwards, With walker Number of Stairs: 2 General stair comments: instructed on forward with +1 HHA on R and doorframe L hand. And backward with RW. Handout provided for stairs with RW.   Wheelchair Mobility    Modified Rankin (Stroke Patients Only)       Balance Overall balance assessment: Needs assistance Sitting-balance support: No upper extremity supported, Feet supported Sitting balance-Leahy Scale: Good     Standing balance support: Bilateral upper extremity supported, During functional activity, Reliant on assistive device for balance Standing balance-Leahy Scale: Poor                              Cognition Arousal/Alertness: Awake/alert Behavior During Therapy: WFL for tasks assessed/performed Overall Cognitive Status: Within Functional Limits for tasks assessed                                          Exercises Total Joint Exercises Ankle Circles/Pumps:  AROM, Both, 10 reps Quad Sets: AROM, Both, 10 reps Short Arc Quad: AROM, Right, 5 reps Heel Slides: AROM, Right, 5 reps Hip ABduction/ADduction: AROM, Right, 5 reps    General Comments General comments (skin integrity, edema, etc.): VSS on RA      Pertinent Vitals/Pain Pain Assessment Pain Assessment: Faces Pain  Score: 6  Faces Pain Scale: Hurts a little bit Pain Location: R hip Pain Descriptors / Indicators: Discomfort, Operative site guarding Pain Intervention(s): Premedicated before session, Monitored during session, Repositioned    Home Living Family/patient expects to be discharged to:: Private residence Living Arrangements: Spouse/significant other Available Help at Discharge: Family;Available 24 hours/day Type of Home: House Home Access: Stairs to enter Entrance Stairs-Rails: None Entrance Stairs-Number of Steps: 2   Home Layout: Two level;Able to live on main level with bedroom/bathroom Home Equipment: Rolling Walker (2 wheels);Cane - single point;Shower seat;BSC/3in1 Additional Comments: Pt has borrowed above noted DME from her church.    Prior Function            PT Goals (current goals can now be found in the care plan section) Acute Rehab PT Goals Patient Stated Goal: home PT Goal Formulation: With patient Time For Goal Achievement: 05/28/22 Potential to Achieve Goals: Good Progress towards PT goals: Progressing toward goals    Frequency    7X/week      PT Plan Current plan remains appropriate    Co-evaluation              AM-PAC PT "6 Clicks" Mobility   Outcome Measure  Help needed turning from your back to your side while in a flat bed without using bedrails?: A Little Help needed moving from lying on your back to sitting on the side of a flat bed without using bedrails?: A Little Help needed moving to and from a bed to a chair (including a wheelchair)?: A Little Help needed standing up from a chair using your arms (e.g., wheelchair or bedside chair)?: A Little Help needed to walk in hospital room?: A Little Help needed climbing 3-5 steps with a railing? : A Little 6 Click Score: 18    End of Session Equipment Utilized During Treatment: Gait belt Activity Tolerance: Patient tolerated treatment well Patient left: in chair;with call bell/phone within  reach Nurse Communication: Mobility status PT Visit Diagnosis: Other abnormalities of gait and mobility (R26.89);Pain Pain - Right/Left: Right Pain - part of body: Hip     Time: 5726-2035 PT Time Calculation (min) (ACUTE ONLY): 31 min  Charges:  $Gait Training: 8-22 mins $Therapeutic Exercise: 8-22 mins                     Allison Whitaker, PT  Office # [phone] Pager [phone]    Allison Whitaker 05/14/2022, 12:30 PM

## 2022-05-14 NOTE — Plan of Care (Signed)
  Problem: Clinical Measurements: Goal: Diagnostic test results will improve Outcome: Not Progressing   Problem: Clinical Measurements: Goal: Will remain free from infection Outcome: Not Progressing   Problem: Nutrition: Goal: Adequate nutrition will be maintained Outcome: Not Progressing   Problem: Pain Managment: Goal: General experience of comfort will improve Outcome: Not Progressing   Problem: Safety: Goal: Ability to remain free from injury will improve Outcome: Not Progressing

## 2022-05-14 NOTE — Progress Notes (Signed)
Lower extremity venous left study completed.   Please see CV Proc for preliminary results.   Jencarlos Nicolson, RDMS, RVT  

## 2022-05-14 NOTE — Evaluation (Signed)
Physical Therapy Evaluation Patient Details Name: Allison Whitaker MRN: [id number] DOB: 21-Feb-1941 Today's Date: 05/14/2022  History of Present Illness  Pt is a 81 y.o. female s/p R THA, anterior approach. PMH: HTN, RA, hypothyroidism  Clinical Impression  Pt is s/p THA resulting in the deficits listed below (see PT Problem List). PTA pt lived at home with husband, independent and driving. On eval, pt required mod assist bed mobility, min assist transfers, and min assist ambulation 100' with RW. Pt will benefit from skilled PT to increase their independence and safety with mobility to allow discharge to the venue listed below.         Recommendations for follow up therapy are one component of a multi-disciplinary discharge planning process, led by the attending physician.  Recommendations may be updated based on patient status, additional functional criteria and insurance authorization.  Follow Up Recommendations Follow physician's recommendations for discharge plan and follow up therapies    Assistance Recommended at Discharge PRN  Patient can return home with the following  Assistance with cooking/housework;A little help with bathing/dressing/bathroom;Assist for transportation;Help with stairs or ramp for entrance    Equipment Recommendations None recommended by PT (Pt has all needed DME.)  Recommendations for Other Services       Functional Status Assessment Patient has had a recent decline in their functional status and demonstrates the ability to make significant improvements in function in a reasonable and predictable amount of time.     Precautions / Restrictions Precautions Precautions: Fall Restrictions Weight Bearing Restrictions: Yes RLE Weight Bearing: Weight bearing as tolerated      Mobility  Bed Mobility Overal bed mobility: Needs Assistance Bed Mobility: Supine to Sit     Supine to sit: HOB elevated, Mod assist     General bed mobility comments: +rail,  increased time, assist with RLE and trunk, use of bed pad to scoot to EOB    Transfers Overall transfer level: Needs assistance Equipment used: Rolling walker (2 wheels) Transfers: Sit to/from Stand Sit to Stand: Min assist           General transfer comment: cues for hand placement, assist to power up and stabilize balance    Ambulation/Gait Ambulation/Gait assistance: Min assist Gait Distance (Feet): 100 Feet Assistive device: Rolling walker (2 wheels) Gait Pattern/deviations: Step-to pattern, Step-through pattern, Decreased stride length, Decreased weight shift to right, Decreased step length - left   Gait velocity interpretation: <1.31 ft/sec, indicative of household ambulator   General Gait Details: cues for step through pattern and posture, assist to maintain balance during turns and stepping backwards  Science writer    Modified Rankin (Stroke Patients Only)       Balance Overall balance assessment: Needs assistance Sitting-balance support: No upper extremity supported, Feet supported Sitting balance-Leahy Scale: Good     Standing balance support: Bilateral upper extremity supported, During functional activity, Reliant on assistive device for balance Standing balance-Leahy Scale: Poor                               Pertinent Vitals/Pain Pain Assessment Pain Assessment: 0-10 Pain Score: 6  Pain Location: R hip Pain Descriptors / Indicators: Grimacing, Guarding, Discomfort Pain Intervention(s): Monitored during session, Repositioned, Patient requesting pain meds-RN notified, Ice applied    Home Living Family/patient expects to be discharged to:: Private residence Living Arrangements: Spouse/significant other Available Help at  Discharge: Family;Available 24 hours/day Type of Home: House Home Access: Stairs to enter Entrance Stairs-Rails: None Entrance Stairs-Number of Steps: 2   Home Layout: Two level;Able to  live on main level with bedroom/bathroom Home Equipment: Rolling Walker (2 wheels);Cane - single point;Shower seat;BSC/3in1 Additional Comments: Pt has borrowed above noted DME from her church.    Prior Function Prior Level of Function : Independent/Modified Independent                     Hand Dominance        Extremity/Trunk Assessment   Upper Extremity Assessment Upper Extremity Assessment: Overall WFL for tasks assessed    Lower Extremity Assessment Lower Extremity Assessment: RLE deficits/detail RLE Deficits / Details: s/p THA, anterior approach    Cervical / Trunk Assessment Cervical / Trunk Assessment: Normal  Communication   Communication: No difficulties  Cognition Arousal/Alertness: Awake/alert Behavior During Therapy: WFL for tasks assessed/performed Overall Cognitive Status: Within Functional Limits for tasks assessed                                          General Comments General comments (skin integrity, edema, etc.): VSS on RA    Exercises     Assessment/Plan    PT Assessment Patient needs continued PT services  PT Problem List Decreased mobility;Decreased activity tolerance;Decreased balance;Pain;Decreased knowledge of use of DME       PT Treatment Interventions DME instruction;Therapeutic activities;Gait training;Therapeutic exercise;Patient/family education;Stair training;Balance training;Functional mobility training    PT Goals (Current goals can be found in the Care Plan section)  Acute Rehab PT Goals Patient Stated Goal: home PT Goal Formulation: With patient Time For Goal Achievement: 05/28/22 Potential to Achieve Goals: Good    Frequency 7X/week     Co-evaluation               AM-PAC PT "6 Clicks" Mobility  Outcome Measure Help needed turning from your back to your side while in a flat bed without using bedrails?: A Little Help needed moving from lying on your back to sitting on the side of a flat  bed without using bedrails?: A Lot Help needed moving to and from a bed to a chair (including a wheelchair)?: A Little Help needed standing up from a chair using your arms (e.g., wheelchair or bedside chair)?: A Little Help needed to walk in hospital room?: A Little Help needed climbing 3-5 steps with a railing? : A Lot 6 Click Score: 16    End of Session Equipment Utilized During Treatment: Gait belt Activity Tolerance: Patient tolerated treatment well Patient left: in chair;with call bell/phone within reach Nurse Communication: Mobility status PT Visit Diagnosis: Other abnormalities of gait and mobility (R26.89);Pain Pain - Right/Left: Right Pain - part of body: Hip    Time: 9323-5573 PT Time Calculation (min) (ACUTE ONLY): 25 min   Charges:   PT Evaluation $PT Eval Moderate Complexity: 1 Mod PT Treatments $Gait Training: 8-22 mins        Lorrin Goodell, PT  Office # [phone] Pager [phone]   Lorriane Shire 05/14/2022, 10:32 AM

## 2022-05-14 NOTE — TOC Transition Note (Signed)
Transition of Care [facility]) - CM/SW Discharge Note   Patient Details  Name: Allison Whitaker MRN: [id number] Date of Birth: 04-01-1941  [facility] CM/SW Contact:  Sharin Mons, RN Phone Number: 05/14/2022, 11:58 AM   Clinical Narrative:    Patient will DC to: home Anticipated DC date: 05/14/2022 Family notified: yes Transport by: car   Per MD patient ready for DC today. RN, patient, patient's family, and [place] notified of DC.  Pt without DME needs, declined RW, BSC.   Post hospital f/u noted on AVS. Pt without Rx med concerns.  Husband to provide transportation to home.  RNCM will sign off for now as intervention is no longer needed. Please consult [place] again if new needs arise.    Final next level of care: [place] Barriers to Discharge: No Barriers Identified   Patient Goals and CMS Choice        Discharge Placement                       Discharge Plan and Services                                     Social Determinants of Health (SDOH) Interventions     Readmission Risk Interventions     View : No data to display.

## 2022-05-16 ENCOUNTER — Telehealth: Payer: Self-pay | Admitting: *Deleted

## 2022-05-16 NOTE — Telephone Encounter (Signed)
[id number]/ tct-[place]/is at home getting around with walker.  Pain is controlled more soreness than pain.  Doing p.t. has prescribed.  No other needs.  Will call if needed. Violet Seabury,BSN,RN3,CCM,CNP.

## 2022-05-28 ENCOUNTER — Ambulatory Visit (INDEPENDENT_AMBULATORY_CARE_PROVIDER_SITE_OTHER): Payer: Medicare Other | Admitting: Orthopaedic Surgery

## 2022-05-28 ENCOUNTER — Encounter: Payer: Self-pay | Admitting: Orthopaedic Surgery

## 2022-05-28 DIAGNOSIS — Z96641 Presence of right artificial hip joint: Secondary | ICD-10-CM

## 2022-05-28 NOTE — Progress Notes (Signed)
   Post-Op Visit Note   Patient: Allison Whitaker           Date of Birth: [birth year]           MRN: [id number] Visit Date: 05/28/2022 PCP: Burnard Bunting, MD   Assessment & Plan:  Chief Complaint:  Chief Complaint  Patient presents with   Right Hip - Routine Post Op   Visit Diagnoses:  1. Status post total replacement of right hip     Plan: Allison Whitaker is 2 weeks status post right total hip replacement 05/13/2022.  She has some slight pain in the thigh. Home health PT 3 times a day.  Walks with a single-point cane and takes oxycodone once a day.  Examination of right hip shows a healed surgical incision.  Small postsurgical seroma.  No signs of infection.  Good passive range of motion of the hip without pain.  Sutures removed Steri-Strips applied.  Allison Whitaker is doing well from her recent hip replacement.  She will work on walking for the next month.  Implant card handicap tag provided.  Continue aspirin for DVT prophylaxis.  Dental prophylaxis reinforced.  Recheck in 4 weeks with standing AP pelvis x-rays.  Follow-Up Instructions: Return in about 4 weeks (around 06/25/2022).   Orders:  No orders of the defined types were placed in this encounter.  No orders of the defined types were placed in this encounter.   Imaging: No results found.  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   Primary osteoarthritis of right hip 05/12/2022   Age-related osteoporosis without current pathological fracture 10/22/2021   Body mass index (BMI) 33.0-33.9, adult 10/22/2021   Hypercalcemia 10/22/2021   Primary osteoarthritis 10/22/2021   Rheumatoid arthritis ([place]) 12/23/2004   Past Medical History:  Diagnosis Date   Arthritis    Rheumatoid    GERD (gastroesophageal reflux disease)    Hypertension    Hypothyroidism    takes synthroid   Rheumatoid arthritis(714.0)    Thyroid disease     Family History  Problem  Relation Age of Onset   Stroke Mother    Colon cancer Neg Hx     Past Surgical History:  Procedure Laterality Date   OOPHORECTOMY  12/23/1981   TOTAL HIP ARTHROPLASTY Right 05/13/2022   Procedure: RIGHT TOTAL HIP ARTHROPLASTY ANTERIOR APPROACH;  Surgeon: Leandrew Koyanagi, MD;  Location: [facility];  Service: Orthopedics;  Laterality: Right;  3-C   WISDOM TOOTH EXTRACTION     all 4 teeth removed when she was in her 45s   WRIST SURGERY Right 12/24/2007   for tendonitis   Social History   Occupational History   Not on file  Tobacco Use   Smoking status: Never   Smokeless tobacco: Never  Vaping Use   Vaping Use: Never used  Substance and Sexual Activity   Alcohol use: Yes    Alcohol/week: 1.0 - 2.0 standard drink    Types: 1 - 2 Glasses of wine per week   Drug use: No   Sexual activity: Not on file

## 2022-06-04 NOTE — Telephone Encounter (Signed)
If the swelling has increased and the pain has gotten a lot worse, I am happy for her to come in to be seen.

## 2022-06-05 ENCOUNTER — Ambulatory Visit (INDEPENDENT_AMBULATORY_CARE_PROVIDER_SITE_OTHER): Payer: Medicare Other | Admitting: Physician Assistant

## 2022-06-05 ENCOUNTER — Telehealth: Payer: Self-pay | Admitting: Physician Assistant

## 2022-06-05 DIAGNOSIS — Z96641 Presence of right artificial hip joint: Secondary | ICD-10-CM

## 2022-06-05 MED ORDER — DOXYCYCLINE HYCLATE 50 MG PO CAPS: 50.0000 mg | ORAL_CAPSULE | Freq: Two times a day (BID) | ORAL | 0 refills | Status: DC

## 2022-06-05 NOTE — Telephone Encounter (Signed)
Pt called requesting a call back from [place]. Pt states she had surgery last week and has some medical questions. Please call pt at [phone].

## 2022-06-05 NOTE — Progress Notes (Signed)
Post-Op Visit Note   Patient: Allison Whitaker           Date of Birth: Aug 05, 1941           MRN: [id number] Visit Date: 06/05/2022 PCP: Burnard Bunting, MD   Assessment & Plan:  Chief Complaint:  Chief Complaint  Patient presents with   Right Hip - Routine Post Op   Visit Diagnoses:  1. History of total hip replacement, right     Plan: Patient is a pleasant 81 year old female who is here approximately 3-1/2 weeks status post right total hip replacement, 05/13/2022 comes in today with slightly increased pain to the right lateral hip.  When she was seen a few weeks ago she did have a small seroma.  She has been using heat for this.  She does not feel as though this has gotten smaller.  The pain has slightly increased since Saturday but that is around the time she stopped taking pain medication.  She denies any fevers or chills.  Examination of the right hip reveals a mild to moderate-sized seroma.  No skin changes.  Mild tenderness.  Slight pain with hip flexion and logroll.  She is neurovascular intact distally.  At this point, believe her symptoms are coming not only from postsurgical changes but from the seroma.  I have discussed aspirating this today for which she is agreeable to.  Approximately 45 cc of straw-colored fluid was aspirated.  She tolerated this well.  She will continue using heat as needed.  She is allergic to Keflex as well as sulfa so I will put her on prophylactic doxycycline.  She will follow-up with [place] at her regularly scheduled appointment.  She will call with any concerns or questions in the meantime.  Follow-Up Instructions: Return for f/u  regularly scheduled appointment.   Orders:  No orders of the defined types were placed in this encounter.  Meds ordered this encounter  Medications   doxycycline (VIBRAMYCIN) 50 MG capsule    Sig: Take 1 capsule (50 mg total) by mouth 2 (two) times daily.    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   Primary osteoarthritis of right hip 05/12/2022   Age-related osteoporosis without current pathological fracture 10/22/2021   Body mass index (BMI) 33.0-33.9, adult 10/22/2021   Hypercalcemia 10/22/2021   Primary osteoarthritis 10/22/2021   Rheumatoid arthritis ([place]) 12/23/2004   Past Medical History:  Diagnosis Date   Arthritis    Rheumatoid    GERD (gastroesophageal reflux disease)    Hypertension    Hypothyroidism    takes synthroid   Rheumatoid arthritis(714.0)    Thyroid disease     Family History  Problem Relation Age of Onset   Stroke Mother    Colon cancer Neg Hx     Past Surgical History:  Procedure Laterality Date   OOPHORECTOMY  12/23/1981   TOTAL HIP ARTHROPLASTY Right 05/13/2022   Procedure: RIGHT TOTAL HIP ARTHROPLASTY ANTERIOR APPROACH;  Surgeon: Leandrew Koyanagi, MD;  Location: [facility];  Service: Orthopedics;  Laterality: Right;  3-C   WISDOM TOOTH EXTRACTION     all 4 teeth removed when she was in her 44s   WRIST SURGERY Right 12/24/2007   for tendonitis   Social History   Occupational History   Not on file  Tobacco Use   Smoking status: Never   Smokeless tobacco: Never  Vaping Use   Vaping Use: Never used  Substance and Sexual Activity   Alcohol use: Yes    Alcohol/week: 1.0 - 2.0 standard drink of alcohol    Types: 1 - 2 Glasses of wine per week   Drug use: No   Sexual activity: Not on file

## 2022-06-26 ENCOUNTER — Ambulatory Visit (INDEPENDENT_AMBULATORY_CARE_PROVIDER_SITE_OTHER): Payer: Medicare Other

## 2022-06-26 ENCOUNTER — Ambulatory Visit (INDEPENDENT_AMBULATORY_CARE_PROVIDER_SITE_OTHER): Payer: Medicare Other | Admitting: Orthopaedic Surgery

## 2022-06-26 ENCOUNTER — Encounter: Payer: Self-pay | Admitting: Orthopaedic Surgery

## 2022-06-26 DIAGNOSIS — Z96641 Presence of right artificial hip joint: Secondary | ICD-10-CM

## 2022-06-26 MED ORDER — DOXYCYCLINE HYCLATE 100 MG PO TABS: 100.0000 mg | ORAL_TABLET | Freq: Two times a day (BID) | ORAL | 0 refills | Status: DC

## 2022-06-26 NOTE — Progress Notes (Signed)
Post-Op Visit Note   Patient: Allison Whitaker           Date of Birth: May 21, 1941           MRN: [id number] Visit Date: 06/26/2022 PCP: Burnard Bunting, MD   Assessment & Plan:  Chief Complaint:  Chief Complaint  Patient presents with   Right Hip - Follow-up    Right total hip arthroplasty 05/13/2022   Visit Diagnoses:  1. History of total hip replacement, right     Plan: Allison Whitaker is 6 weeks status post right total hip on 05/13/2022.  Feels some soreness which is likely due to some recent overactivity from being at the [facility].  She has no real complaints otherwise.  She is functional with virtually all ADLs.  Walks with a cane in public.  Examination of right hip shows a fully healed surgical scar.  Some local redness that looks inflammatory and not cellulitic.  No seroma that I can feel.  X-rays demonstrate stable implant.  Allison Whitaker has done well from the surgery.  She will to continue to increase her activity as tolerated.  Just to be safe I am going to put her on 10 days of doxycycline for the redness.  She will use ice and heat and backing off on some activity.  Recheck in 6 weeks.  Dental prophylaxis reinforced.  Follow-Up Instructions: Return in about 6 weeks (around 08/07/2022).   Orders:  Orders Placed This Encounter  Procedures   XR Pelvis 1-2 Views   Meds ordered this encounter  Medications   doxycycline (VIBRA-TABS) 100 MG tablet    Sig: Take 1 tablet (100 mg total) by mouth 2 (two) times daily.    Dispense:  20 tablet    Refill:  0    Imaging: XR Pelvis 1-2 Views  Result Date: 06/26/2022 Stable total hip replacement without complications   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   Primary osteoarthritis of right hip 05/12/2022   Age-related osteoporosis without current pathological fracture 10/22/2021   Body mass index (BMI) 33.0-33.9, adult 10/22/2021   Hypercalcemia 10/22/2021    Primary osteoarthritis 10/22/2021   Rheumatoid arthritis ([place]) 12/23/2004   Past Medical History:  Diagnosis Date   Arthritis    Rheumatoid    GERD (gastroesophageal reflux disease)    Hypertension    Hypothyroidism    takes synthroid   Rheumatoid arthritis(714.0)    Thyroid disease     Family History  Problem Relation Age of Onset   Stroke Mother    Colon cancer Neg Hx     Past Surgical History:  Procedure Laterality Date   OOPHORECTOMY  12/23/1981   TOTAL HIP ARTHROPLASTY Right 05/13/2022   Procedure: RIGHT TOTAL HIP ARTHROPLASTY ANTERIOR APPROACH;  Surgeon: Leandrew Koyanagi, MD;  Location: [place] [facility];  Service: Orthopedics;  Laterality: Right;  3-C   WISDOM TOOTH EXTRACTION     all 4 teeth removed when she was in her 85s   WRIST SURGERY Right 12/24/2007   for tendonitis   Social History   Occupational History   Not on file  Tobacco Use   Smoking status: Never   Smokeless tobacco: Never  Vaping Use   Vaping Use: Never used  Substance and Sexual Activity   Alcohol use: Yes    Alcohol/week: 1.0 - 2.0 standard drink of alcohol    Types: 1 - 2 Glasses of wine per week  Drug use: No   Sexual activity: Not on file

## 2022-08-13 ENCOUNTER — Ambulatory Visit: Payer: Medicare Other | Admitting: Orthopaedic Surgery

## 2022-08-13 DIAGNOSIS — Z96641 Presence of right artificial hip joint: Secondary | ICD-10-CM

## 2022-08-13 NOTE — Progress Notes (Signed)
   Post-Op Visit Note   Patient: Allison Whitaker           Date of Birth: [birth year]           MRN: [id number] Visit Date: 08/13/2022 PCP: Burnard Bunting, MD   Assessment & Plan:  Chief Complaint:  Chief Complaint  Patient presents with   Right Hip - Routine Post Op        Right total hip arthroplasty 05/13/2022     Visit Diagnoses:  1. Status post total replacement of right hip     Plan: Allison Whitaker is 3 months status post right total hip on 05/13/2022.  Doing well overall has no problems.  Overdid it on a recent trip to [place] and [place].  Lamination of right hip shows soft and nontender thigh.  Insert fully healed surgical scars.  Normal gait.  Good range of motion without pain.  Allison Whitaker is doing really well from her surgery and recovering appropriately.  She has felt much better since she has been able to rest her hip since her trip.  I think she overdid it.  Dental prophylaxis reinforced.  Recheck in 3 months with standing AP pelvis x-rays.  Follow-Up Instructions: Return in about 3 months (around 11/13/2022).   Orders:  No orders of the defined types were placed in this encounter.  No orders of the defined types were placed in this encounter.   Imaging: No results found.  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   Primary osteoarthritis of right hip 05/12/2022   Age-related osteoporosis without current pathological fracture 10/22/2021   Body mass index (BMI) 33.0-33.9, adult 10/22/2021   Hypercalcemia 10/22/2021   Primary osteoarthritis 10/22/2021   Rheumatoid arthritis ([place]) 12/23/2004   Past Medical History:  Diagnosis Date   Arthritis    Rheumatoid    GERD (gastroesophageal reflux disease)    Hypertension    Hypothyroidism    takes synthroid   Rheumatoid arthritis(714.0)    Thyroid disease     Family History  Problem Relation Age of Onset   Stroke Mother    Colon  cancer Neg Hx     Past Surgical History:  Procedure Laterality Date   OOPHORECTOMY  12/23/1981   TOTAL HIP ARTHROPLASTY Right 05/13/2022   Procedure: RIGHT TOTAL HIP ARTHROPLASTY ANTERIOR APPROACH;  Surgeon: Leandrew Koyanagi, MD;  Location: [facility];  Service: Orthopedics;  Laterality: Right;  3-C   WISDOM TOOTH EXTRACTION     all 4 teeth removed when she was in her 98s   WRIST SURGERY Right 12/24/2007   for tendonitis   Social History   Occupational History   Not on file  Tobacco Use   Smoking status: Never   Smokeless tobacco: Never  Vaping Use   Vaping Use: Never used  Substance and Sexual Activity   Alcohol use: Yes    Alcohol/week: 1.0 - 2.0 standard drink of alcohol    Types: 1 - 2 Glasses of wine per week   Drug use: No   Sexual activity: Not on file

## 2022-09-09 IMAGING — CT CT L SPINE W/ CM
1 of 6 series · 7 of 14 positions shown, 9 images · IV contrast (omnipaque)
Comparison: None

CLINICAL DATA: Low back pain.  No previous lumbar surgery.

EXAM:
LUMBAR MYELOGRAM
CT LUMBAR SPINE WITH INTRATHECAL CONTRAST
FLUOROSCOPY TIME:  41 seconds; 330 u5ymH DAP
TECHNIQUE: The procedure, risks (including but not limited to bleeding,
infection, organ damage ), benefits, and alternatives were explained
to the patient. Questions regarding the procedure were encouraged
and answered. The patient understands and consents to the procedure.
An appropriate entry site was determined under fluoroscopy. Operator
donned sterile gloves and mask. Skin site was marked, prepped with
Betadine, and draped in usual sterile fashion, and infiltrated
locally with 1% lidocaine. A 22 gauge spinal needle was advanced
into the thecal sac at L3 from a left parasagittal approach. Clear
colorless CSF returned. 17 ml Omnipaque 180 were administered
intrathecally for lumbar myelography, followed by axial CT scanning
of the lumbar spine. I personally performed the lumbar puncture and
administered the intrathecal contrast. I also personally supervised
acquisition of the myelogram images. Coronal and sagittal
reconstructions were generated from the axial scan.

[Series 3: l spine soft · axial · 0.34mm/px · z∈[-349,-181]mm · 7 of 113 slices shown, 9 images]
[im 15/113  soft-tissue]
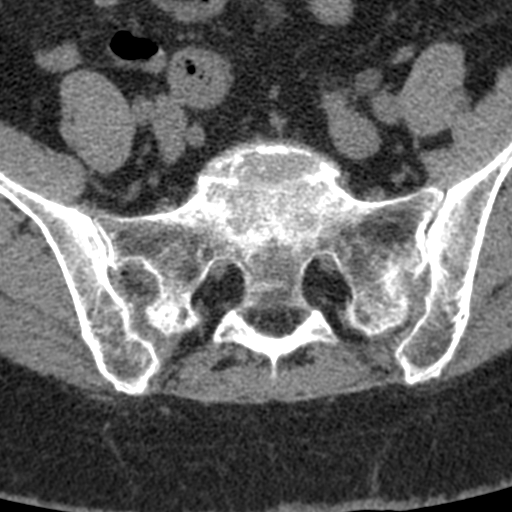
[im 15/113  bone]
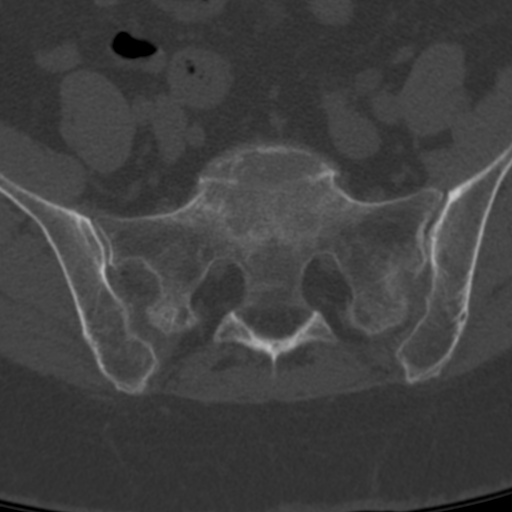
[im 29/113  bone]
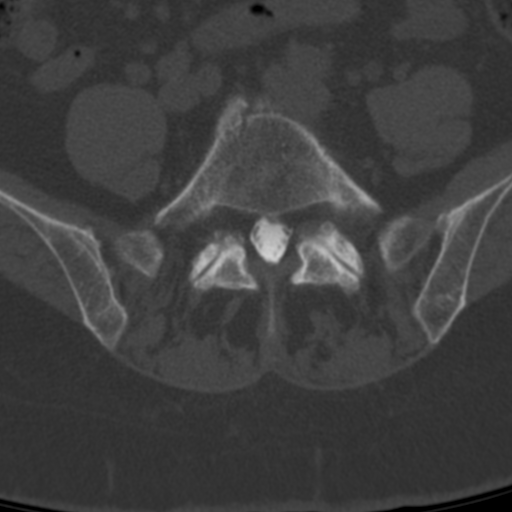
[im 43/113  bone]
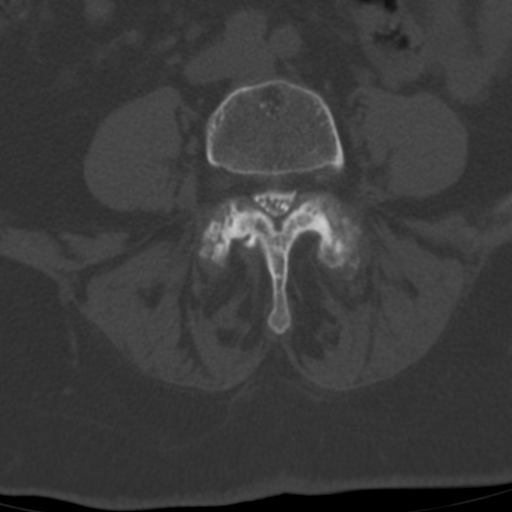
[im 57/113  bone]
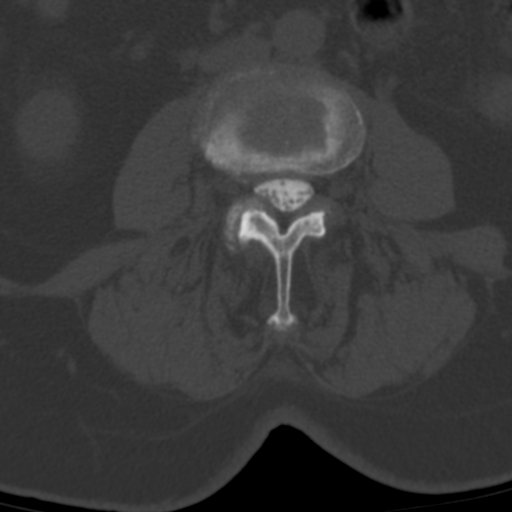
[im 71/113  soft-tissue]
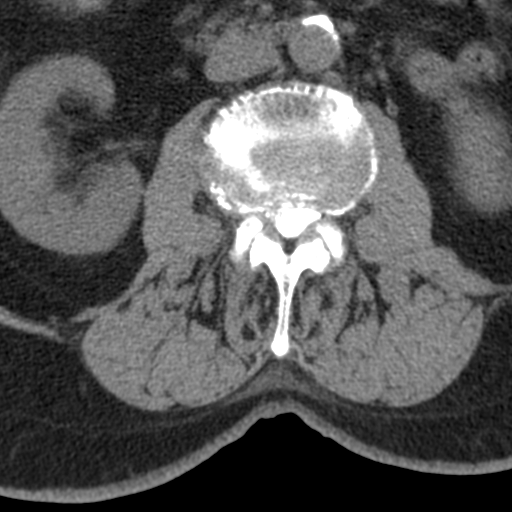
[im 71/113  bone]
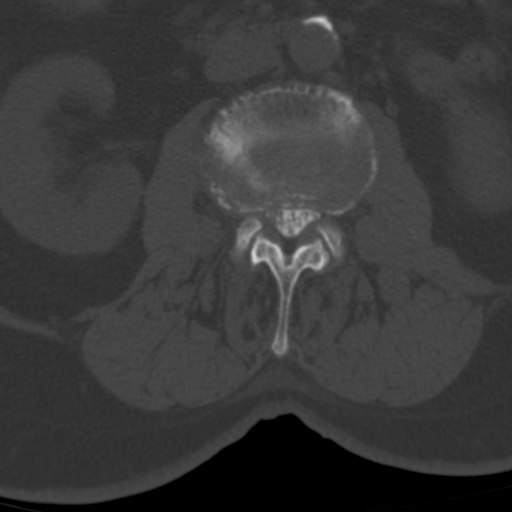
[im 85/113  bone]
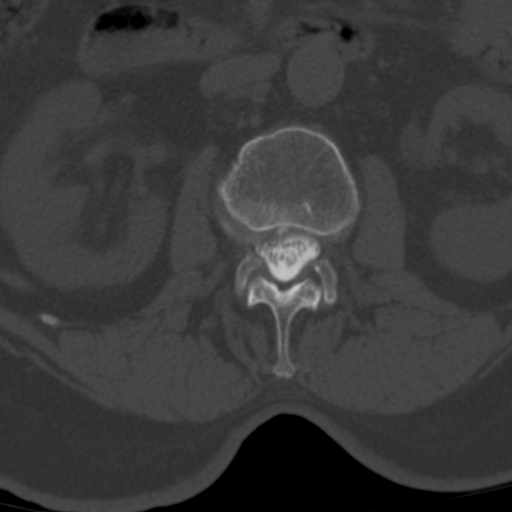
[im 99/113  bone]
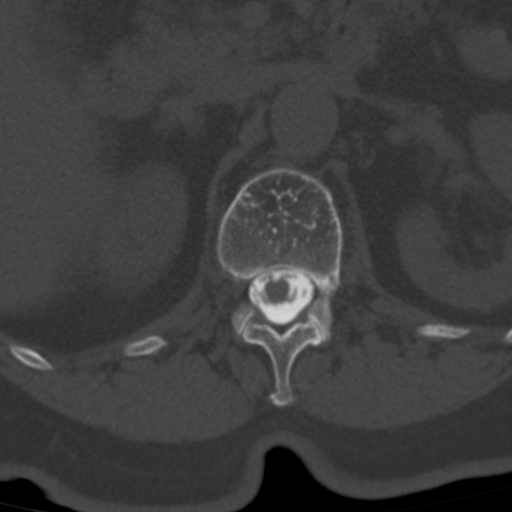

[7 of 14 positions shown; findings below may reference images not displayed]

FINDINGS: 5 non rib-bearing lumbar segments labeled L1-L5. Benign vertebral
hemangiomas in the L1, L2, and L4 vertebral bodies. Negative for
fracture.

T12-L1: Interspace unremarkable. Central canal and foramina patent.

L1-2: Conus terminates at the interspace. Mild circumferential
calcified disc bulge. Mild right lateral recess encroachment. No
significant spinal or foraminal stenosis.

L2-3: Mild narrowing of the interspace on the right. Moderate
calcified posterior disc bulge right worse than left. There is mild
facet DJD left worse than right and some thickening of the
ligamentum flavum contributing to moderate central canal stenosis,
right foraminal stenosis, and bilateral lateral recess stenosis.

L3-4: Calcified circumferential disc bulge. Moderate bilateral facet
DJD with congenitally short pedicles and thickening of ligamentum
flavum contributing to mild central canal stenosis and early
bilateral foraminal encroachment.

L4-5: Advanced bilateral facet DJD with grade 1 anterolisthesis.
Mild posterior calcified disc bulge contributes to severe
multifactorial spinal stenosis and bilateral foraminal encroachment.

L5-S1: Small left paracentral partially calcified protrusion
approaches the left S1 nerve root. No spinal or foraminal stenosis.

Moderate calcified aortoiliac atherosclerosis (BKDZY-170.0). 7 mm
lower pole left renal calculus without hydronephrosis. Remainder
visualized paraspinal soft tissues unremarkable.
IMPRESSION: 1. Mild disc bulge L1-2 with right lateral recess encroachment.
2. Moderate multifactorial spinal stenosis L2-3, with bilateral
lateral recess and right foraminal stenosis.
3. Mild multifactorial spinal stenosis L3-4 with early foraminal
encroachment bilaterally.
4. Severe multifactorial spinal stenosis and grade 1 anterolisthesis
L4-5 with bilateral foraminal encroachment.
5. Left paracentral protrusion L5-S1 approaches the left S1 nerve
root without compression.
6. Nonobstructive left nephrolithiasis.

## 2022-11-13 ENCOUNTER — Ambulatory Visit: Payer: Medicare Other | Admitting: Orthopaedic Surgery

## 2022-11-13 ENCOUNTER — Ambulatory Visit (INDEPENDENT_AMBULATORY_CARE_PROVIDER_SITE_OTHER): Payer: Medicare Other

## 2022-11-13 DIAGNOSIS — Z96641 Presence of right artificial hip joint: Secondary | ICD-10-CM | POA: Diagnosis not present

## 2022-11-13 NOTE — Progress Notes (Signed)
   Post-Op Visit Note   Patient: Allison Whitaker           Date of Birth: [birth year]           MRN: [id number] Visit Date: 11/13/2022 PCP: Burnard Bunting, MD   Assessment & Plan:  Chief Complaint:  Chief Complaint  Patient presents with   Right Hip - Follow-up    05/13/22 right THA   Visit Diagnoses:  1. Status post total replacement of right hip     Plan: Allison Whitaker is now approximately 6 months status post right total hip replacement on 05/13/2022.  She has no complaints regarding the right hip and is very pleased.  She has started to notice pain in the left hip.  Examination of the right hip shows a fully healed surgical scar.  She has fluid painless range of motion of the hip.  She has normal gait.  X-rays show stable implant in good position without any complications.  Dental prophylaxis reinforced.  Activity as tolerated.  Follow-up in 6 months for 1 year checkup with standing AP pelvis x-rays.  Follow-Up Instructions: Return in about 6 months (around 05/14/2023).   Orders:  Orders Placed This Encounter  Procedures   XR Pelvis 1-2 Views   No orders of the defined types were placed in this encounter.   Imaging: XR Pelvis 1-2 Views  Result Date: 11/13/2022 X-rays show stable right total hip replacement without any complications in good alignment and position.   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   Primary osteoarthritis of right hip 05/12/2022   Age-related osteoporosis without current pathological fracture 10/22/2021   Body mass index (BMI) 33.0-33.9, adult 10/22/2021   Hypercalcemia 10/22/2021   Primary osteoarthritis 10/22/2021   Rheumatoid arthritis ([place]) 12/23/2004   Past Medical History:  Diagnosis Date   Arthritis    Rheumatoid    GERD (gastroesophageal reflux disease)    Hypertension    Hypothyroidism    takes synthroid   Rheumatoid arthritis(714.0)    Thyroid disease      Family History  Problem Relation Age of Onset   Stroke Mother    Colon cancer Neg Hx     Past Surgical History:  Procedure Laterality Date   OOPHORECTOMY  12/23/1981   TOTAL HIP ARTHROPLASTY Right 05/13/2022   Procedure: RIGHT TOTAL HIP ARTHROPLASTY ANTERIOR APPROACH;  Surgeon: Leandrew Koyanagi, MD;  Location: [facility];  Service: Orthopedics;  Laterality: Right;  3-C   WISDOM TOOTH EXTRACTION     all 4 teeth removed when she was in her 36s   WRIST SURGERY Right 12/24/2007   for tendonitis   Social History   Occupational History   Not on file  Tobacco Use   Smoking status: Never   Smokeless tobacco: Never  Vaping Use   Vaping Use: Never used  Substance and Sexual Activity   Alcohol use: Yes    Alcohol/week: 1.0 - 2.0 standard drink of alcohol    Types: 1 - 2 Glasses of wine per week   Drug use: No   Sexual activity: Not on file

## 2022-12-31 ENCOUNTER — Other Ambulatory Visit: Payer: Self-pay

## 2022-12-31 ENCOUNTER — Telehealth: Payer: Self-pay | Admitting: Orthopaedic Surgery

## 2022-12-31 MED ORDER — AMOXICILLIN 500 MG PO TABS: ORAL_TABLET | ORAL | 2 refills | Status: DC

## 2022-12-31 NOTE — Telephone Encounter (Signed)
Patient is having some dental work done and need antibiotics. Please advise..[phone]

## 2022-12-31 NOTE — Telephone Encounter (Signed)
Called and notified patient.

## 2023-02-19 ENCOUNTER — Ambulatory Visit
Admission: EM | Admit: 2023-02-19 | Discharge: 2023-02-19 | Disposition: A | Discharge status: Home / Self Care | Payer: Medicare Other | Attending: Physician Assistant | Admitting: Physician Assistant

## 2023-02-19 DIAGNOSIS — R1084 Generalized abdominal pain: Secondary | ICD-10-CM | POA: Diagnosis present

## 2023-02-19 DIAGNOSIS — Z1152 Encounter for screening for COVID-19: Secondary | ICD-10-CM | POA: Diagnosis present

## 2023-02-19 DIAGNOSIS — R112 Nausea with vomiting, unspecified: Secondary | ICD-10-CM | POA: Insufficient documentation

## 2023-02-19 MED ORDER — ONDANSETRON 4 MG PO TBDP
4.0000 mg | ORAL_TABLET | Freq: Once | ORAL | Status: AC
Start: 2023-02-19 — End: 2023-02-19
  Administered 2023-02-19: 4 mg via ORAL

## 2023-02-19 MED ORDER — ONDANSETRON HCL 4 MG PO TABS: 4.0000 mg | ORAL_TABLET | Freq: Three times a day (TID) | ORAL | 0 refills | Status: AC | PRN

## 2023-02-19 NOTE — ED Provider Notes (Signed)
[facility]    CSN: [id number] Arrival date & time: 02/19/23  1209      History   Chief Complaint Chief Complaint  Patient presents with   Fatigue    Fatigue, vomiting, fever, chills. X2 days    HPI Allison Whitaker is a 82 y.o. female.   MD 85-year-old female presents with nausea and vomiting.  Patient indicates over the past 2 days she has been having increasing fatigue, lethargy, abdominal discomfort with stomach cramping, nausea with repeated vomiting.  Patient indicates she has also had fever intermittent, chills, without diarrhea.  She is currently tolerating fluids well.  She indicates that she did a COVID test at home and it was negative.  She indicates she has not been around any family, friends that have been sick with similar symptoms.  She indicates she has not eaten any foods that did not taste right.  She indicates she is tolerating fluids, she is without wheezing or shortness of breath.  She indicates she has not traveled recently.  She does get lab work done on a regular basis because she is taking methotrexate.  She indicates she was able to give blood about 10 days ago without problems.  Patient indicates she is vaccinated against flu, COVID, and pneumonia.     Past Medical History:  Diagnosis Date   Arthritis    Rheumatoid    GERD (gastroesophageal reflux disease)    Hypertension    Hypothyroidism    takes synthroid   Rheumatoid arthritis(714.0)    Thyroid disease     Patient Active Problem List   Diagnosis Date Noted   Status post total replacement of right hip 05/13/2022   Primary osteoarthritis of right hip 05/12/2022   Age-related osteoporosis without current pathological fracture 10/22/2021   Body mass index (BMI) 33.0-33.9, adult 10/22/2021   Hypercalcemia 10/22/2021   Primary osteoarthritis 10/22/2021   Rheumatoid arthritis ([place]) 12/23/2004    Past Surgical History:  Procedure Laterality Date   OOPHORECTOMY  12/23/1981   TOTAL HIP  ARTHROPLASTY Right 05/13/2022   Procedure: RIGHT TOTAL HIP ARTHROPLASTY ANTERIOR APPROACH;  Surgeon: Leandrew Koyanagi, MD;  Location: [place];  Service: Orthopedics;  Laterality: Right;  3-C   WISDOM TOOTH EXTRACTION     all 4 teeth removed when she was in her 29s   WRIST SURGERY Right 12/24/2007   for tendonitis    OB History   No obstetric history on file.      Home Medications    Prior to Admission medications   Medication Sig Start Date End Date Taking? Authorizing Provider  amoxicillin (AMOXIL) 500 MG tablet Take 4 tablets by mouth 1 hour prior to dental procedure. 12/31/22   Leandrew Koyanagi, MD  Cholecalciferol (VITAMIN D) 50 MCG (2000 UT) tablet Take 2,000 Units by mouth daily.   Yes [provider]  folic acid (FOLVITE) 1 MG tablet Take 3 mg by mouth daily.   Yes [provider]  gabapentin (NEURONTIN) 100 MG capsule Take 200 mg by mouth at bedtime. 04/17/22  Yes [provider]  levothyroxine (SYNTHROID, LEVOTHROID) 75 MCG tablet Take 75 mcg by mouth daily.   Yes [provider]  losartan-hydrochlorothiazide (HYZAAR) 100-25 MG tablet Take 1 tablet by mouth daily. 02/26/21  Yes [provider]  methotrexate (RHEUMATREX) 2.5 MG tablet Take 20 mg by mouth every Tuesday. Caution:Chemotherapy. Protect from light. Takes 8  tabs once per week.   Yes [provider]  Multiple Vitamin (MULTIVITAMIN)  tablet Take 1 tablet by mouth daily.   Yes [provider]  omeprazole (PRILOSEC) 20 MG capsule Take 20 mg by mouth daily.   Yes [provider]  ondansetron (ZOFRAN) 4 MG tablet Take 1 tablet (4 mg total) by mouth every 8 (eight) hours as needed for nausea or vomiting. 02/19/23  Yes Nyoka Lint, PA-C  Vitamin A 2400 MCG (8000 UT) CAPS Take 8,000 Units by mouth daily. 11/23/19  Yes [provider]  aspirin EC 81 MG tablet Take 1 tablet (81 mg total) by mouth 2 (two) times daily. To be taken after surgery to prevent blood clots  05/06/22   Aundra Dubin, PA-C  docusate sodium (COLACE) 100 MG capsule Take 1 capsule (100 mg total) by mouth daily as needed. 05/06/22 05/06/23  Aundra Dubin, PA-C  doxycycline (VIBRA-TABS) 100 MG tablet Take 1 tablet (100 mg total) by mouth 2 (two) times daily. 06/26/22   Leandrew Koyanagi, MD  doxycycline (VIBRAMYCIN) 50 MG capsule Take 1 capsule (50 mg total) by mouth 2 (two) times daily. 06/05/22   Aundra Dubin, PA-C  methocarbamol (ROBAXIN) 500 MG tablet Take 1 tablet (500 mg total) by mouth 2 (two) times daily as needed for muscle spasms. To be taken after surgery 05/06/22   Aundra Dubin, PA-C  ondansetron (ZOFRAN) 4 MG tablet Take 1 tablet (4 mg total) by mouth every 8 (eight) hours as needed for nausea or vomiting. 05/06/22   Aundra Dubin, PA-C  oxyCODONE-acetaminophen (PERCOCET) 5-325 MG tablet Take 1-2 tablets by mouth every 6 (six) hours as needed. To be taken after surgery 05/06/22   Aundra Dubin, PA-C    Family History Family History  Problem Relation Age of Onset   Stroke Mother    Colon cancer Neg Hx     Social History Social History   Tobacco Use   Smoking status: Never   Smokeless tobacco: Never  Vaping Use   Vaping Use: Never used  Substance Use Topics   Alcohol use: Yes    Alcohol/week: 1.0 - 2.0 standard drink of alcohol    Types: 1 - 2 Glasses of wine per week   Drug use: No     Allergies   Cephalexin and Sulfa antibiotics   Review of Systems Review of Systems   Physical Exam Triage Vital Signs ED Triage Vitals  Enc Vitals Group     BP 02/19/23 1225 112/73     Pulse Rate 02/19/23 1225 87     Resp 02/19/23 1225 16     Temp 02/19/23 1225 98.3 F (36.8 C)     Temp Source 02/19/23 1225 Oral     SpO2 02/19/23 1225 95 %     Weight 02/19/23 1223 190 lb (86.2 kg)     Height 02/19/23 1223 '5\' 5"'$  (1.651 m)     Head Circumference --      Peak Flow --      Pain Score 02/19/23 1223 0     Pain Loc --      Pain Edu? --      Excl. in [place]? --     No data found.  Updated Vital Signs BP 112/73 (BP Location: Left Arm)   Pulse 87   Temp 98.3 F (36.8 C) (Oral)   Resp 16   Ht '5\' 5"'$  (1.651 m)   Wt 190 lb (86.2 kg)   SpO2 95%   BMI 31.62 kg/m   Visual Acuity Right Eye Distance:  Left Eye Distance:   Bilateral Distance:    Right Eye Near:   Left Eye Near:    Bilateral Near:     Physical Exam   UC Treatments / Results  Labs (all labs ordered are listed, but only abnormal results are displayed) Labs Reviewed  SARS CORONAVIRUS 2 (TAT 6-24 HRS)    EKG   Radiology No results found.  Procedures Procedures (including critical care time)  Medications Ordered in UC Medications  ondansetron (ZOFRAN-ODT) disintegrating tablet 4 mg (4 mg Oral Given 02/19/23 1252)    Initial Impression / Assessment and Plan / UC Course  I have reviewed the triage vital signs and the nursing notes.  Pertinent labs & imaging results that were available during my care of the patient were reviewed by me and considered in my medical decision making (see chart for details).    Plan: Diagnosis will be treated with the following: 1.  Vomiting: A.  Zofran 4 mg given in the office today. B.  Zofran 4 mg every 8 hours on a regular basis to help decrease nausea and vomiting. 2.  Generalized abdominal pain: A.  Zofran 4 mg every 8 hours on a regular basis to help decrease nausea, vomiting, and abdominal discomfort. 3.  Screening for COVID-19: A.  Treatment may be modified depending on results of the COVID test. 4.  Patient advised to follow a bland diet over the next 48 hours and increase fluid intake with clear liquids to protect against dehydration. 5.  Patient advised follow-up PCP return to urgent care if symptoms fail to improve. Final Clinical Impressions(s) / UC Diagnoses   Final diagnoses:  Generalized abdominal pain  Nausea and vomiting, unspecified vomiting type  Encounter for screening for COVID-19     Discharge  Instructions      Advised take Zofran 4 mg every 8 hours on a regular basis to help decrease stomach cramping, and nausea. Advised to increase fluid intake with clear liquids and moved to a bland diet over the next 24 hours.  Advised to follow-up with PCP or return to urgent care if symptoms fail to improve over the next 48 to 72 hours.    ED Prescriptions     Medication Sig Dispense Auth. Provider   ondansetron (ZOFRAN) 4 MG tablet Take 1 tablet (4 mg total) by mouth every 8 (eight) hours as needed for nausea or vomiting. 20 tablet Nyoka Lint, PA-C      PDMP not reviewed this encounter.   Nyoka Lint, PA-C 02/19/23 1259

## 2023-02-19 NOTE — Discharge Instructions (Signed)
Advised take Zofran 4 mg every 8 hours on a regular basis to help decrease stomach cramping, and nausea. Advised to increase fluid intake with clear liquids and moved to a bland diet over the next 24 hours.  Advised to follow-up with PCP or return to urgent care if symptoms fail to improve over the next 48 to 72 hours.

## 2023-02-19 NOTE — ED Triage Notes (Signed)
X2 days  Pt states that she has some fatigue, vomiting, fever, chills and loss of appetite.

## 2023-02-20 LAB — SARS CORONAVIRUS 2 (TAT 6-24 HRS): SARS Coronavirus 2: NEGATIVE

## 2023-05-14 ENCOUNTER — Encounter: Payer: Self-pay | Admitting: Orthopaedic Surgery

## 2023-05-14 ENCOUNTER — Other Ambulatory Visit (INDEPENDENT_AMBULATORY_CARE_PROVIDER_SITE_OTHER): Payer: Medicare Other

## 2023-05-14 ENCOUNTER — Ambulatory Visit: Payer: Medicare Other | Admitting: Orthopaedic Surgery

## 2023-05-14 DIAGNOSIS — M25551 Pain in right hip: Secondary | ICD-10-CM | POA: Diagnosis not present

## 2023-05-14 DIAGNOSIS — Z96641 Presence of right artificial hip joint: Secondary | ICD-10-CM

## 2023-05-14 MED ORDER — BUPIVACAINE HCL 0.5 % IJ SOLN
3.0000 mL | INTRAMUSCULAR | Status: AC | PRN
  Administered 2023-05-14: 3 mL via INTRA_ARTICULAR

## 2023-05-14 MED ORDER — LIDOCAINE HCL 1 % IJ SOLN
3.0000 mL | INTRAMUSCULAR | Status: AC | PRN
  Administered 2023-05-14: 3 mL

## 2023-05-14 MED ORDER — METHYLPREDNISOLONE ACETATE 40 MG/ML IJ SUSP
40.0000 mg | INTRAMUSCULAR | Status: AC | PRN
Start: 2023-05-14 — End: 2023-05-14
  Administered 2023-05-14: 40 mg via INTRA_ARTICULAR

## 2023-05-14 NOTE — Progress Notes (Signed)
Office Visit Note   Patient: Allison Whitaker           Date of Birth: 03-05-1941           MRN: [id number] Visit Date: 05/14/2023              Requested by: Geoffry Paradise, MD [address] [place],  [place] [address] PCP: Geoffry Paradise, MD   Assessment & Plan: Visit Diagnoses:  1. Status post total replacement of right hip   2. Pain in right hip     Plan: Impression is a 82 year old female 1 year status post right total hip replacement and new problem of trochanteric pain.  Based on treatment options she would like to try a steroid injection as well as home exercises.  She tolerated the injection well.  We will see her back in a year for 2-year follow-up from the hip replacement.  Follow-Up Instructions: Return in about 1 year (around 05/13/2024).   Orders:  Orders Placed This Encounter  Procedures   Large Joint Inj   XR Pelvis 1-2 Views   XR Lumbar Spine 2-3 Views   No orders of the defined types were placed in this encounter.     Procedures: Large Joint Inj: R greater trochanter on 05/14/2023 10:28 AM Indications: pain Details: 22 G needle  Arthrogram: No  Medications: 3 mL lidocaine 1 %; 3 mL bupivacaine 0.5 %; 40 mg methylPREDNISolone acetate 40 MG/ML Patient was prepped and draped in the usual sterile fashion.       Clinical Data: No additional findings.   Subjective: Chief Complaint  Patient presents with   Right Hip - Follow-up    Right total hip arthroplasty 05/13/2022    HPI Allison Whitaker is a 82 year old female who is 1 year status post right total hip replacement.  She is very satisfied with the outcome.  Her main issue is the lateral hip pain with occasional back and buttock pain.  Denies any radicular symptoms.  Denies any injuries. Review of Systems   Objective: Vital Signs: There were no vitals taken for this visit.  Physical Exam  Ortho Exam Examination right hip shows fully healed surgical scar.  She has excellent fluid painless range of  motion.  She does have a tenderness to the trochanteric region.  Lumbar spine is nontender.  No sign of tension signs. Specialty Comments:  No specialty comments available.  Imaging: XR Lumbar Spine 2-3 Views  Result Date: 05/14/2023 2 view lumbar spine x-rays show no acute abnormalities.  There is degenerative scoliosis.  XR Pelvis 1-2 Views  Result Date: 05/14/2023 Stable total hip replacement without complications    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   Primary osteoarthritis of right hip 05/12/2022   Age-related osteoporosis without current pathological fracture 10/22/2021   Body mass index (BMI) 33.0-33.9, adult 10/22/2021   Hypercalcemia 10/22/2021   Primary osteoarthritis 10/22/2021   Rheumatoid arthritis (HCC) 12/23/2004   Past Medical History:  Diagnosis Date   Arthritis    Rheumatoid    GERD (gastroesophageal reflux disease)    Hypertension    Hypothyroidism    takes synthroid   Rheumatoid arthritis(714.0)    Thyroid disease     Family History  Problem Relation Age of Onset   Stroke Mother    Colon cancer Neg Hx     Past Surgical History:  Procedure Laterality Date   OOPHORECTOMY  12/23/1981  TOTAL HIP ARTHROPLASTY Right 05/13/2022   Procedure: RIGHT TOTAL HIP ARTHROPLASTY ANTERIOR APPROACH;  Surgeon: Tarry Kos, MD;  Location: [facility] OR;  Service: Orthopedics;  Laterality: Right;  3-C   WISDOM TOOTH EXTRACTION     all 4 teeth removed when she was in her 26s   WRIST SURGERY Right 12/24/2007   for tendonitis   Social History   Occupational History   Not on file  Tobacco Use   Smoking status: Never   Smokeless tobacco: Never  Vaping Use   Vaping Use: Never used  Substance and Sexual Activity   Alcohol use: Yes    Alcohol/week: 1.0 - 2.0 standard drink of alcohol    Types: 1 - 2 Glasses of wine per week   Drug use: No   Sexual activity: Not on file

## 2023-06-04 ENCOUNTER — Other Ambulatory Visit: Payer: Self-pay | Admitting: Registered Nurse

## 2023-06-04 DIAGNOSIS — D72825 Bandemia: Secondary | ICD-10-CM

## 2023-06-04 DIAGNOSIS — R112 Nausea with vomiting, unspecified: Secondary | ICD-10-CM

## 2023-06-04 DIAGNOSIS — R509 Fever, unspecified: Secondary | ICD-10-CM

## 2023-06-05 ENCOUNTER — Ambulatory Visit (HOSPITAL_COMMUNITY)
Admission: RE | Admit: 2023-06-05 | Discharge: 2023-06-05 | Disposition: A | Discharge status: Home / Self Care | Payer: Medicare Other | Source: Ambulatory Visit | Attending: Registered Nurse | Admitting: Registered Nurse

## 2023-06-05 DIAGNOSIS — R509 Fever, unspecified: Secondary | ICD-10-CM | POA: Insufficient documentation

## 2023-06-05 DIAGNOSIS — D72825 Bandemia: Secondary | ICD-10-CM | POA: Insufficient documentation

## 2023-06-05 DIAGNOSIS — R112 Nausea with vomiting, unspecified: Secondary | ICD-10-CM | POA: Diagnosis present

## 2023-06-05 MED ORDER — IOHEXOL 300 MG/ML  SOLN
100.0000 mL | Freq: Once | INTRAMUSCULAR | Status: AC | PRN
  Administered 2023-06-05: 100 mL via INTRAVENOUS

## 2023-06-05 MED ORDER — SODIUM CHLORIDE (PF) 0.9 % IJ SOLN
INTRAMUSCULAR | Status: AC
  Filled 2023-06-05: qty 50

## 2023-06-06 ENCOUNTER — Encounter (HOSPITAL_COMMUNITY)
Admission: RE | Disposition: A | Discharge status: Home / Self Care | Payer: Self-pay | Source: Ambulatory Visit | Attending: Urology

## 2023-06-06 ENCOUNTER — Inpatient Hospital Stay (HOSPITAL_COMMUNITY): Payer: Medicare Other

## 2023-06-06 ENCOUNTER — Encounter (HOSPITAL_COMMUNITY): Payer: Self-pay | Admitting: Urology

## 2023-06-06 ENCOUNTER — Ambulatory Visit (HOSPITAL_COMMUNITY)
Admission: RE | Admit: 2023-06-06 | Discharge: 2023-06-06 | Disposition: A | Discharge status: Home / Self Care | Payer: Medicare Other | Source: Ambulatory Visit | Attending: Urology | Admitting: Urology

## 2023-06-06 ENCOUNTER — Other Ambulatory Visit: Payer: Self-pay | Admitting: Urology

## 2023-06-06 ENCOUNTER — Inpatient Hospital Stay (HOSPITAL_COMMUNITY): Payer: Medicare Other | Admitting: Anesthesiology

## 2023-06-06 ENCOUNTER — Inpatient Hospital Stay (HOSPITAL_BASED_OUTPATIENT_CLINIC_OR_DEPARTMENT_OTHER): Payer: Medicare Other | Admitting: Anesthesiology

## 2023-06-06 DIAGNOSIS — Z6831 Body mass index (BMI) 31.0-31.9, adult: Secondary | ICD-10-CM | POA: Insufficient documentation

## 2023-06-06 DIAGNOSIS — N390 Urinary tract infection, site not specified: Secondary | ICD-10-CM

## 2023-06-06 DIAGNOSIS — E039 Hypothyroidism, unspecified: Secondary | ICD-10-CM | POA: Diagnosis not present

## 2023-06-06 DIAGNOSIS — N136 Pyonephrosis: Secondary | ICD-10-CM | POA: Insufficient documentation

## 2023-06-06 DIAGNOSIS — Z79899 Other long term (current) drug therapy: Secondary | ICD-10-CM | POA: Diagnosis not present

## 2023-06-06 DIAGNOSIS — I10 Essential (primary) hypertension: Secondary | ICD-10-CM | POA: Insufficient documentation

## 2023-06-06 DIAGNOSIS — N132 Hydronephrosis with renal and ureteral calculous obstruction: Secondary | ICD-10-CM

## 2023-06-06 DIAGNOSIS — K760 Fatty (change of) liver, not elsewhere classified: Secondary | ICD-10-CM | POA: Diagnosis not present

## 2023-06-06 DIAGNOSIS — D638 Anemia in other chronic diseases classified elsewhere: Secondary | ICD-10-CM

## 2023-06-06 DIAGNOSIS — Z96641 Presence of right artificial hip joint: Secondary | ICD-10-CM | POA: Insufficient documentation

## 2023-06-06 DIAGNOSIS — E669 Obesity, unspecified: Secondary | ICD-10-CM | POA: Diagnosis not present

## 2023-06-06 DIAGNOSIS — K219 Gastro-esophageal reflux disease without esophagitis: Secondary | ICD-10-CM | POA: Diagnosis not present

## 2023-06-06 HISTORY — PX: CYSTOSCOPY W/ URETERAL STENT PLACEMENT: SHX1429

## 2023-06-06 LAB — BASIC METABOLIC PANEL
Anion gap: 10 (ref 5–15)
BUN: 34 mg/dL — ABNORMAL HIGH (ref 8–23)
CO2: 22 mmol/L (ref 22–32)
Calcium: 9.3 mg/dL (ref 8.9–10.3)
Chloride: 100 mmol/L (ref 98–111)
Creatinine, Ser: 1.36 mg/dL — ABNORMAL HIGH (ref 0.44–1.00)
GFR, Estimated: 40 mL/min — ABNORMAL LOW (ref >60)
Glucose, Bld: 123 mg/dL — ABNORMAL HIGH (ref 70–99)
Potassium: 3.4 mmol/L — ABNORMAL LOW (ref 3.5–5.1)
Sodium: 132 mmol/L — ABNORMAL LOW (ref 135–145)

## 2023-06-06 LAB — CBC
HCT: 37.6 % (ref 36.0–46.0)
Hemoglobin: 12.2 g/dL (ref 12.0–15.0)
MCH: 30.9 pg (ref 26.0–34.0)
MCHC: 32.4 g/dL (ref 30.0–36.0)
MCV: 95.2 fL (ref 80.0–100.0)
Platelets: 148 10*3/uL — ABNORMAL LOW (ref 150–400)
RBC: 3.95 MIL/uL (ref 3.87–5.11)
RDW: 14.6 % (ref 11.5–15.5)
WBC: 13.1 10*3/uL — ABNORMAL HIGH (ref 4.0–10.5)
nRBC: 0 % (ref 0.0–0.2)

## 2023-06-06 SURGERY — CYSTOSCOPY, WITH RETROGRADE PYELOGRAM AND URETERAL STENT INSERTION
Anesthesia: General | Laterality: Right

## 2023-06-06 MED ORDER — SUCCINYLCHOLINE CHLORIDE 200 MG/10ML IV SOSY
PREFILLED_SYRINGE | INTRAVENOUS | Status: AC
  Filled 2023-06-06: qty 10

## 2023-06-06 MED ORDER — 0.9 % SODIUM CHLORIDE (POUR BTL) OPTIME
TOPICAL | Status: DC | PRN
  Administered 2023-06-06: 3000 mL
  Administered 2023-06-06: 1000 mL

## 2023-06-06 MED ORDER — DEXAMETHASONE SODIUM PHOSPHATE 10 MG/ML IJ SOLN
INTRAMUSCULAR | Status: AC
  Filled 2023-06-06: qty 1

## 2023-06-06 MED ORDER — CIPROFLOXACIN IN D5W 400 MG/200ML IV SOLN
400.0000 mg | INTRAVENOUS | Status: AC
  Administered 2023-06-06: 400 mg via INTRAVENOUS
  Filled 2023-06-06: qty 200

## 2023-06-06 MED ORDER — ONDANSETRON HCL 4 MG/2ML IJ SOLN
INTRAMUSCULAR | Status: DC | PRN
  Administered 2023-06-06: 4 mg via INTRAVENOUS

## 2023-06-06 MED ORDER — ONDANSETRON HCL 4 MG/2ML IJ SOLN
INTRAMUSCULAR | Status: AC
  Filled 2023-06-06: qty 2

## 2023-06-06 MED ORDER — FENTANYL CITRATE (PF) 100 MCG/2ML IJ SOLN
INTRAMUSCULAR | Status: AC
  Filled 2023-06-06: qty 2

## 2023-06-06 MED ORDER — MELOXICAM 15 MG PO TABS: 15.0000 mg | ORAL_TABLET | Freq: Every day | ORAL | 1 refills | Status: AC

## 2023-06-06 MED ORDER — OXYCODONE HCL 5 MG/5ML PO SOLN: 5.0000 mg | Freq: Once | ORAL | Status: DC | PRN

## 2023-06-06 MED ORDER — PHENYLEPHRINE 80 MCG/ML (10ML) SYRINGE FOR IV PUSH (FOR BLOOD PRESSURE SUPPORT)
PREFILLED_SYRINGE | INTRAVENOUS | Status: DC | PRN
  Administered 2023-06-06: 80 ug via INTRAVENOUS

## 2023-06-06 MED ORDER — PROPOFOL 10 MG/ML IV BOLUS
INTRAVENOUS | Status: AC
  Filled 2023-06-06: qty 20

## 2023-06-06 MED ORDER — PROPOFOL 10 MG/ML IV BOLUS
INTRAVENOUS | Status: DC | PRN
  Administered 2023-06-06: 130 mg via INTRAVENOUS

## 2023-06-06 MED ORDER — EPHEDRINE SULFATE-NACL 50-0.9 MG/10ML-% IV SOSY
PREFILLED_SYRINGE | INTRAVENOUS | Status: DC | PRN
  Administered 2023-06-06 (×2): 5 mg via INTRAVENOUS

## 2023-06-06 MED ORDER — LIDOCAINE HCL (PF) 2 % IJ SOLN
INTRAMUSCULAR | Status: AC
  Filled 2023-06-06: qty 5

## 2023-06-06 MED ORDER — IOHEXOL 300 MG/ML  SOLN
INTRAMUSCULAR | Status: DC | PRN
  Administered 2023-06-06: 10 mL

## 2023-06-06 MED ORDER — HYOSCYAMINE SULFATE 0.125 MG SL SUBL: 0.1250 mg | SUBLINGUAL_TABLET | SUBLINGUAL | 0 refills | Status: DC | PRN

## 2023-06-06 MED ORDER — FENTANYL CITRATE (PF) 100 MCG/2ML IJ SOLN
INTRAMUSCULAR | Status: DC | PRN
  Administered 2023-06-06: 100 ug via INTRAVENOUS

## 2023-06-06 MED ORDER — ONDANSETRON HCL 4 MG/2ML IJ SOLN: 4.0000 mg | Freq: Once | INTRAMUSCULAR | Status: DC | PRN

## 2023-06-06 MED ORDER — DEXAMETHASONE SODIUM PHOSPHATE 10 MG/ML IJ SOLN
INTRAMUSCULAR | Status: DC | PRN
  Administered 2023-06-06: 8 mg via INTRAVENOUS

## 2023-06-06 MED ORDER — EPHEDRINE 5 MG/ML INJ
INTRAVENOUS | Status: AC
  Filled 2023-06-06: qty 5

## 2023-06-06 MED ORDER — SUCCINYLCHOLINE CHLORIDE 200 MG/10ML IV SOSY
PREFILLED_SYRINGE | INTRAVENOUS | Status: DC | PRN
  Administered 2023-06-06: 120 mg via INTRAVENOUS

## 2023-06-06 MED ORDER — TRAMADOL HCL 50 MG PO TABS: 50.0000 mg | ORAL_TABLET | Freq: Four times a day (QID) | ORAL | 0 refills | Status: DC | PRN

## 2023-06-06 MED ORDER — LIDOCAINE 2% (20 MG/ML) 5 ML SYRINGE
INTRAMUSCULAR | Status: DC | PRN
  Administered 2023-06-06: 80 mg via INTRAVENOUS

## 2023-06-06 MED ORDER — OXYCODONE HCL 5 MG PO TABS: 5.0000 mg | ORAL_TABLET | Freq: Once | ORAL | Status: DC | PRN

## 2023-06-06 MED ORDER — LACTATED RINGERS IV SOLN: INTRAVENOUS | Status: DC

## 2023-06-06 MED ORDER — HYDROMORPHONE HCL 1 MG/ML IJ SOLN: 0.2500 mg | INTRAMUSCULAR | Status: DC | PRN

## 2023-06-06 SURGICAL SUPPLY — 11 items
BAG URO CATCHER STRL LF (MISCELLANEOUS) ×1 IMPLANT
CATH URETL OPEN 5X70 (CATHETERS) ×1 IMPLANT
CLOTH BEACON ORANGE TIMEOUT ST (SAFETY) ×1 IMPLANT
GLOVE BIO SURGEON STRL SZ7 (GLOVE) ×1 IMPLANT
GOWN STRL REUS W/ TWL XL LVL3 (GOWN DISPOSABLE) ×1 IMPLANT
GOWN STRL REUS W/TWL XL LVL3 (GOWN DISPOSABLE) ×1
GUIDEWIRE ZIPWRE .038 STRAIGHT (WIRE) ×1 IMPLANT
KIT TURNOVER KIT A (KITS) IMPLANT
MANIFOLD NEPTUNE II (INSTRUMENTS) ×1 IMPLANT
PACK CYSTO (CUSTOM PROCEDURE TRAY) ×1 IMPLANT
TUBING CONNECTING 10 (TUBING) ×1 IMPLANT

## 2023-06-06 NOTE — Discharge Instructions (Signed)

## 2023-06-06 NOTE — H&P (Signed)
H&P  History of Present Illness: Allison Whitaker is a 82 y.o. year old F who presents today for urgent stent placement. She has a right ureteral stone and a suspected UTI   Past Medical History:  Diagnosis Date   Arthritis    Rheumatoid    GERD (gastroesophageal reflux disease)    Hypertension    Hypothyroidism    takes synthroid   Rheumatoid arthritis(714.0)    Thyroid disease     Past Surgical History:  Procedure Laterality Date   OOPHORECTOMY  12/23/1981   TOTAL HIP ARTHROPLASTY Right 05/13/2022   Procedure: RIGHT TOTAL HIP ARTHROPLASTY ANTERIOR APPROACH;  Surgeon: Tarry Kos, MD;  Location: [facility] OR;  Service: Orthopedics;  Laterality: Right;  3-C   WISDOM TOOTH EXTRACTION     all 4 teeth removed when she was in her 65s   WRIST SURGERY Right 12/24/2007   for tendonitis    Home Medications:  Current Meds  Medication Sig   Cholecalciferol (VITAMIN D) 50 MCG (2000 UT) tablet Take 2,000 Units by mouth daily.   folic acid (FOLVITE) 1 MG tablet Take 3 mg by mouth daily.   gabapentin (NEURONTIN) 100 MG capsule Take 200 mg by mouth at bedtime.   levothyroxine (SYNTHROID, LEVOTHROID) 75 MCG tablet Take 75 mcg by mouth daily.   losartan-hydrochlorothiazide (HYZAAR) 100-25 MG tablet Take 1 tablet by mouth daily.   Multiple Vitamin (MULTIVITAMIN) tablet Take 1 tablet by mouth daily.   omeprazole (PRILOSEC) 20 MG capsule Take 20 mg by mouth daily.   Vitamin A 2400 MCG (8000 UT) CAPS Take 8,000 Units by mouth daily.    Allergies:  Allergies  Allergen Reactions   Cephalexin Rash    Pt unsure of this   Sulfa Antibiotics Itching and Rash    Family History  Problem Relation Age of Onset   Stroke Mother    Colon cancer Neg Hx     Social History:  reports that she has never smoked. She has never used smokeless tobacco. She reports current alcohol use of about 1.0 - 2.0 standard drink of alcohol per week. She reports that she does not use drugs.  ROS: A complete review of  systems was performed.  All systems are negative except for pertinent findings as noted.  Physical Exam:  Vital signs in last 24 hours: Temp:  [99.3 F (37.4 C)] 99.3 F (37.4 C) (06/14 1321) Pulse Rate:  [61] 61 (06/14 1321) Resp:  [16] 16 (06/14 1321) BP: (122)/(64) 122/64 (06/14 1321) SpO2:  [99 %] 99 % (06/14 1321) Weight:  [85.5 kg] 85.5 kg (06/14 1321) Constitutional:  Alert and oriented, No acute distress Cardiovascular: Regular rate and rhythm Respiratory: Normal respiratory effort, Lungs clear bilaterally GI: Abdomen is soft, nontender, nondistended, no abdominal masses Lymphatic: No lymphadenopathy Neurologic: Grossly intact, no focal deficits Psychiatric: Normal mood and affect   Laboratory Data:  No results for input(s): "WBC", "HGB", "HCT", "PLT" in the last 72 hours.  No results for input(s): "NA", "K", "CL", "GLUCOSE", "BUN", "CALCIUM", "CREATININE" in the last 72 hours.  Invalid input(s): "CO3"   No results found for this or any previous visit (from the past 24 hour(s)). No results found for this or any previous visit (from the past 240 hour(s)).  Renal Function: No results for input(s): "CREATININE" in the last 168 hours. CrCl cannot be calculated (Patient's most recent lab result is older than the maximum 21 days allowed.).  Radiologic Imaging: CT ABDOMEN PELVIS W CONTRAST  Result Date:  06/05/2023 CLINICAL DATA:  Fever, nausea, vomiting EXAM: CT ABDOMEN AND PELVIS WITH CONTRAST TECHNIQUE: Multidetector CT imaging of the abdomen and pelvis was performed using the standard protocol following bolus administration of intravenous contrast. RADIATION DOSE REDUCTION: This exam was performed according to the departmental dose-optimization program which includes automated exposure control, adjustment of the mA and/or kV according to patient size and/or use of iterative reconstruction technique. CONTRAST:  OMNIPAQUE IOHEXOL 300 MG/ML  SOLN COMPARISON:  CT  04/30/2016 FINDINGS: Lower chest: No acute abnormality. Hepatobiliary: Diffusely decreased attenuation of the hepatic parenchyma. No focal liver lesion is identified. Unremarkable gallbladder. No hyperdense gallstone. No biliary dilatation. Pancreas: Unremarkable. No pancreatic ductal dilatation or surrounding inflammatory changes. Spleen: Normal in size without focal abnormality. Adrenals/Urinary Tract: Unremarkable adrenal glands. Obstructing 5 mm stone at the right ureteropelvic junction resulting in moderate right hydronephrosis. Associated right perinephric stranding. Left kidney and ureter within normal limits. No left-sided stone or hydronephrosis. Urinary bladder is decompressed. Stomach/Bowel: Tiny hiatal hernia. Stomach is otherwise within normal limits. Scattered colonic diverticulosis. No evidence of bowel wall thickening, distention, or inflammatory changes. Vascular/Lymphatic: Scattered aortoiliac atherosclerotic calcifications without aneurysm. No abdominopelvic lymphadenopathy. Reproductive: Uterus and bilateral adnexa are unremarkable. Other: Small volume free fluid within the pelvis. No organized abdominopelvic fluid collection. No pneumoperitoneum. Musculoskeletal: Prior right total hip arthroplasty. Degenerative thoracolumbar spondylosis. Grade 1 anterolisthesis of L4 on L5. Multiple intraosseous hemangiomas. No suspicious bone lesion. No acute bony abnormality. IMPRESSION: 1. Obstructing 5 mm stone at the right ureteropelvic junction resulting in moderate right hydronephrosis. 2. Hepatic steatosis. 3. Colonic diverticulosis without evidence of acute diverticulitis. 4. Aortic atherosclerosis (ICD10-I70.0). Electronically Signed   By: Duanne Guess D.O.   On: 06/05/2023 09:50    Assessment:  Allison Whitaker is a 82 y.o. year old F with R ureteral stone   Plan:  --to OR as planned for R stent placement. Procedure and risks reviewed, including but not limited to hematuria, infection, sepsis,  damage to GU tract, failure to complete procedure  Irine Seal, MD 06/06/2023, 2:10 PM  [facility] Pager: [phone]

## 2023-06-06 NOTE — H&P (View-Only) (Signed)
H&P  History of Present Illness: Allison Whitaker is a 82 y.o. year old F who presents today for urgent stent placement. She has a right ureteral stone and a suspected UTI   Past Medical History:  Diagnosis Date   Arthritis    Rheumatoid    GERD (gastroesophageal reflux disease)    Hypertension    Hypothyroidism    takes synthroid   Rheumatoid arthritis(714.0)    Thyroid disease     Past Surgical History:  Procedure Laterality Date   OOPHORECTOMY  12/23/1981   TOTAL HIP ARTHROPLASTY Right 05/13/2022   Procedure: RIGHT TOTAL HIP ARTHROPLASTY ANTERIOR APPROACH;  Surgeon: Xu, Naiping M, MD;  Location: [facility] OR;  Service: Orthopedics;  Laterality: Right;  3-C   WISDOM TOOTH EXTRACTION     all 4 teeth removed when she was in her 20s   WRIST SURGERY Right 12/24/2007   for tendonitis    Home Medications:  Current Meds  Medication Sig   Cholecalciferol (VITAMIN D) 50 MCG (2000 UT) tablet Take 2,000 Units by mouth daily.   folic acid (FOLVITE) 1 MG tablet Take 3 mg by mouth daily.   gabapentin (NEURONTIN) 100 MG capsule Take 200 mg by mouth at bedtime.   levothyroxine (SYNTHROID, LEVOTHROID) 75 MCG tablet Take 75 mcg by mouth daily.   losartan-hydrochlorothiazide (HYZAAR) 100-25 MG tablet Take 1 tablet by mouth daily.   Multiple Vitamin (MULTIVITAMIN) tablet Take 1 tablet by mouth daily.   omeprazole (PRILOSEC) 20 MG capsule Take 20 mg by mouth daily.   Vitamin A 2400 MCG (8000 UT) CAPS Take 8,000 Units by mouth daily.    Allergies:  Allergies  Allergen Reactions   Cephalexin Rash    Pt unsure of this   Sulfa Antibiotics Itching and Rash    Family History  Problem Relation Age of Onset   Stroke Mother    Colon cancer Neg Hx     Social History:  reports that she has never smoked. She has never used smokeless tobacco. She reports current alcohol use of about 1.0 - 2.0 standard drink of alcohol per week. She reports that she does not use drugs.  ROS: A complete review of  systems was performed.  All systems are negative except for pertinent findings as noted.  Physical Exam:  Vital signs in last 24 hours: Temp:  [99.3 F (37.4 C)] 99.3 F (37.4 C) (06/14 1321) Pulse Rate:  [61] 61 (06/14 1321) Resp:  [16] 16 (06/14 1321) BP: (122)/(64) 122/64 (06/14 1321) SpO2:  [99 %] 99 % (06/14 1321) Weight:  [85.5 kg] 85.5 kg (06/14 1321) Constitutional:  Alert and oriented, No acute distress Cardiovascular: Regular rate and rhythm Respiratory: Normal respiratory effort, Lungs clear bilaterally GI: Abdomen is soft, nontender, nondistended, no abdominal masses Lymphatic: No lymphadenopathy Neurologic: Grossly intact, no focal deficits Psychiatric: Normal mood and affect   Laboratory Data:  No results for input(s): "WBC", "HGB", "HCT", "PLT" in the last 72 hours.  No results for input(s): "NA", "K", "CL", "GLUCOSE", "BUN", "CALCIUM", "CREATININE" in the last 72 hours.  Invalid input(s): "CO3"   No results found for this or any previous visit (from the past 24 hour(s)). No results found for this or any previous visit (from the past 240 hour(s)).  Renal Function: No results for input(s): "CREATININE" in the last 168 hours. CrCl cannot be calculated (Patient's most recent lab result is older than the maximum 21 days allowed.).  Radiologic Imaging: CT ABDOMEN PELVIS W CONTRAST  Result Date:   06/05/2023 CLINICAL DATA:  Fever, nausea, vomiting EXAM: CT ABDOMEN AND PELVIS WITH CONTRAST TECHNIQUE: Multidetector CT imaging of the abdomen and pelvis was performed using the standard protocol following bolus administration of intravenous contrast. RADIATION DOSE REDUCTION: This exam was performed according to the departmental dose-optimization program which includes automated exposure control, adjustment of the mA and/or kV according to patient size and/or use of iterative reconstruction technique. CONTRAST:  100mL OMNIPAQUE IOHEXOL 300 MG/ML  SOLN COMPARISON:  CT  04/30/2016 FINDINGS: Lower chest: No acute abnormality. Hepatobiliary: Diffusely decreased attenuation of the hepatic parenchyma. No focal liver lesion is identified. Unremarkable gallbladder. No hyperdense gallstone. No biliary dilatation. Pancreas: Unremarkable. No pancreatic ductal dilatation or surrounding inflammatory changes. Spleen: Normal in size without focal abnormality. Adrenals/Urinary Tract: Unremarkable adrenal glands. Obstructing 5 mm stone at the right ureteropelvic junction resulting in moderate right hydronephrosis. Associated right perinephric stranding. Left kidney and ureter within normal limits. No left-sided stone or hydronephrosis. Urinary bladder is decompressed. Stomach/Bowel: Tiny hiatal hernia. Stomach is otherwise within normal limits. Scattered colonic diverticulosis. No evidence of bowel wall thickening, distention, or inflammatory changes. Vascular/Lymphatic: Scattered aortoiliac atherosclerotic calcifications without aneurysm. No abdominopelvic lymphadenopathy. Reproductive: Uterus and bilateral adnexa are unremarkable. Other: Small volume free fluid within the pelvis. No organized abdominopelvic fluid collection. No pneumoperitoneum. Musculoskeletal: Prior right total hip arthroplasty. Degenerative thoracolumbar spondylosis. Grade 1 anterolisthesis of L4 on L5. Multiple intraosseous hemangiomas. No suspicious bone lesion. No acute bony abnormality. IMPRESSION: 1. Obstructing 5 mm stone at the right ureteropelvic junction resulting in moderate right hydronephrosis. 2. Hepatic steatosis. 3. Colonic diverticulosis without evidence of acute diverticulitis. 4. Aortic atherosclerosis (ICD10-I70.0). Electronically Signed   By: Nicholas  Plundo D.O.   On: 06/05/2023 09:50    Assessment:  Allison Whitaker is a 82 y.o. year old F with R ureteral stone   Plan:  --to OR as planned for R stent placement. Procedure and risks reviewed, including but not limited to hematuria, infection, sepsis,  damage to GU tract, failure to complete procedure  Luke Menachem Urbanek, MD 06/06/2023, 2:10 PM  [facility] Pager: [phone]   

## 2023-06-06 NOTE — Transfer of Care (Signed)
Immediate Anesthesia Transfer of Care Note  Patient: Allison Whitaker  Procedure(s) Performed: CYSTOSCOPY WITH RIGHT RETROGRADE PYELOGRAM/RIGHT URETERAL STENT PLACEMENT (Right)  Patient Location: PACU  Anesthesia Type:General  Level of Consciousness: drowsy and patient cooperative  Airway & Oxygen Therapy: Patient Spontanous Breathing and Patient connected to face mask oxygen  Post-op Assessment: Report given to RN and Post -op Vital signs reviewed and stable  Post vital signs: Reviewed and stable  Last Vitals:  Vitals Value Taken Time  BP 133/58 06/06/23 1539  Temp    Pulse 78 06/06/23 1542  Resp 24 06/06/23 1542  SpO2 100 % 06/06/23 1542  Vitals shown include unvalidated device data.  Last Pain:  Vitals:   06/06/23 1344  TempSrc:   PainSc: 0-No pain         Complications: No notable events documented.

## 2023-06-06 NOTE — Anesthesia Preprocedure Evaluation (Addendum)
Anesthesia Evaluation  Patient identified by MRN, date of birth, ID band Patient awake    Reviewed: Allergy & Precautions, NPO status , Patient's Chart, lab work & pertinent test results, reviewed documented beta blocker date and time   Airway Mallampati: III  TM Distance: >3 FB Neck ROM: Full    Dental  (+) Chipped, Dental Advisory Given, Caps,    Pulmonary neg pulmonary ROS   Pulmonary exam normal breath sounds clear to auscultation       Cardiovascular hypertension, Pt. on medications Normal cardiovascular exam Rhythm:Regular Rate:Normal     Neuro/Psych negative neurological ROS  negative psych ROS   GI/Hepatic ,GERD  Medicated,,Hepatic steatosis   Endo/Other  Hypothyroidism  Obesity  Renal/GU Renal InsufficiencyRenal diseaseRight UPJ calculus  negative genitourinary   Musculoskeletal  (+) Arthritis , Rheumatoid disorders,  S/P Right THR 05/13/22   Abdominal  (+) + obese  Peds  Hematology  (+) Blood dyscrasia, anemia   Anesthesia Other Findings   Reproductive/Obstetrics                             Anesthesia Physical Anesthesia Plan  ASA: 2  Anesthesia Plan: General   Post-op Pain Management: Dilaudid IV   Induction: Intravenous and Cricoid pressure planned  PONV Risk Score and Plan: 4 or greater and Treatment may vary due to age or medical condition, Ondansetron and Dexamethasone  Airway Management Planned: Oral ETT  Additional Equipment: None  Intra-op Plan:   Post-operative Plan: Extubation in OR  Informed Consent: I have reviewed the patients History and Physical, chart, labs and discussed the procedure including the risks, benefits and alternatives for the proposed anesthesia with the patient or authorized representative who has indicated his/her understanding and acceptance.     Dental advisory given  Plan Discussed with: Anesthesiologist and CRNA  Anesthesia  Plan Comments:         Anesthesia Quick Evaluation

## 2023-06-06 NOTE — Anesthesia Procedure Notes (Signed)
Procedure Name: Intubation Date/Time: 06/06/2023 3:12 PM  Performed by: Epimenio Sarin, CRNAPre-anesthesia Checklist: Patient identified, Emergency Drugs available, Suction available, Patient being monitored and Timeout performed Patient Re-evaluated:Patient Re-evaluated prior to induction Oxygen Delivery Method: Circle system utilized Preoxygenation: Pre-oxygenation with 100% oxygen Induction Type: IV induction, Rapid sequence and Cricoid Pressure applied Laryngoscope Size: Mac and 3 Grade View: Grade I Tube type: Oral Tube size: 7.0 mm Number of attempts: 1 Airway Equipment and Method: Stylet Placement Confirmation: ETT inserted through vocal cords under direct vision, positive ETCO2 and breath sounds checked- equal and bilateral Secured at: 21 cm Tube secured with: Tape Dental Injury: Teeth and Oropharynx as per pre-operative assessment

## 2023-06-06 NOTE — Op Note (Signed)
Operative Note  Preoperative diagnosis:  1.  Right ureteral stone 2. UTI 3. Hydronephrosis due to stone  Postoperative diagnosis: 1.  same  Procedure(s): 1.  Cystoscopy 2. right retrograde pyelogram with interpretation 3. right ureteral stent placement 6x24 cm 4. Fluoroscopy <1 hour with intraoperative interpretation  Surgeon: Irine Seal, MD  Assistants:  None  Anesthesia:  General  Complications:  None  EBL:  minimal  Specimens: 1. none  Drains/Catheters: 1.  Right 6Fr x 24cm ureteral stent  Intraoperative findings:   Cystoscopy demonstrated no suspicious lesions, masses, stones or other pathology. Retrograde pyelogram demonstrated right hydronephrosis. Successful right ureteral stent placement with curl in the renal pelvis and bladder respectively.  Indication:  Allison Whitaker is a 82 y.o. female with the above diagnoses. After reviewing the management options for treatment, she elected to proceed with the above surgical procedure(s). We have discussed the potential benefits and risks of the procedure, side effects of the proposed treatment, the likelihood of the patient achieving the goals of the procedure, and any potential problems that might occur during the procedure or recuperation. Informed consent has been obtained.  Description of procedure: The patient was taken to the operating room and general anesthesia was induced.  The patient was placed in the dorsal lithotomy position, prepped and draped in the usual sterile fashion, and preoperative antibiotics were administered. A preoperative time-out was performed.   Cystourethroscopy was performed.  The patient's urethra was examined and was normal. The bladder was then systematically examined in its entirety. There was no evidence for any bladder tumors, stones, or other mucosal pathology.    Attention then turned to the right ureteral orifice. A 0.038 zip wire was passed through the tight orifice and over the wire  a 5 Fr open ended catheter was inserted and passed up to the level of the renal pelvis. Omnipaque contrast was injected through the ureteral catheter and a retrograde pyelogram was performed with findings as dictated above. The wire was then replaced and the open ended catheter was removed.   A 6Fr x 24cm ureteral stent was advance over the wire. The stent was positioned appropriately under fluoroscopic and cystoscopic guidance.  The wire was then removed with an adequate stent curl noted in the renal pelvis as well as in the bladder.  The bladder was then emptied and the procedure ended.  The patient appeared to tolerate the procedure well and without complications.  The patient was able to be awakened and transferred to the recovery unit in satisfactory condition.   Plan:   D/c home will set up ureteroscopy with laser litho for definitive treatment of stone  Allison Seat MD [facility]  Pager: [phone]

## 2023-06-06 NOTE — Anesthesia Postprocedure Evaluation (Signed)
Anesthesia Post Note  Patient: Allison Whitaker  Procedure(s) Performed: CYSTOSCOPY WITH RIGHT RETROGRADE PYELOGRAM/RIGHT URETERAL STENT PLACEMENT (Right)     Patient location during evaluation: PACU Anesthesia Type: General Level of consciousness: awake and alert and oriented Pain management: pain level controlled Vital Signs Assessment: post-procedure vital signs reviewed and stable Respiratory status: spontaneous breathing, nonlabored ventilation and respiratory function stable Cardiovascular status: blood pressure returned to baseline and stable Postop Assessment: no apparent nausea or vomiting Anesthetic complications: no   No notable events documented.  Last Vitals:  Vitals:   06/06/23 1545 06/06/23 1600  BP: (!) 125/58 126/64  Pulse: 84 71  Resp: 17 20  Temp:    SpO2: 98% 96%    Last Pain:  Vitals:   06/06/23 1600  TempSrc:   PainSc: 4                  [name]

## 2023-06-07 ENCOUNTER — Encounter (HOSPITAL_COMMUNITY): Payer: Self-pay | Admitting: Urology

## 2023-06-10 ENCOUNTER — Other Ambulatory Visit: Payer: Self-pay | Admitting: Urology

## 2023-06-11 ENCOUNTER — Encounter (HOSPITAL_BASED_OUTPATIENT_CLINIC_OR_DEPARTMENT_OTHER): Payer: Self-pay | Admitting: Urology

## 2023-06-11 NOTE — Progress Notes (Signed)
Spoke w/ via phone for pre-op interview--- pt Lab needs dos----  Mirant results------ current EKG in epic/ chart COVID test -----patient states asymptomatic no test needed Arrive at ------- 1015 on 06-24-2023 NPO after MN NO Solid Food.  Clear liquids from MN until--- 0915 Med rec completed Medications to take morning of surgery ----- prilosec Diabetic medication ----- n/a Patient instructed no nail polish to be worn day of surgery Patient instructed to bring photo id and insurance card day of surgery Patient aware to have Driver (ride ) / caregiver    for 24 hours after surgery -- husband, Allison Whitaker Patient Special Instructions ----- n/a Pre-Op special Instructions ----- n/a Patient verbalized understanding of instructions that were given at this phone interview. Patient denies shortness of breath, chest pain, fever, cough at this phone interview.

## 2023-06-23 NOTE — Progress Notes (Signed)
Talked with patient. Arrival time was changed to 0845. Clear liquids until 0745

## 2023-06-24 ENCOUNTER — Ambulatory Visit (HOSPITAL_BASED_OUTPATIENT_CLINIC_OR_DEPARTMENT_OTHER)
Admission: RE | Admit: 2023-06-24 | Discharge: 2023-06-24 | Disposition: A | Discharge status: Home / Self Care | Payer: Medicare Other | Source: Ambulatory Visit | Attending: Urology | Admitting: Urology

## 2023-06-24 ENCOUNTER — Other Ambulatory Visit: Payer: Self-pay

## 2023-06-24 ENCOUNTER — Encounter (HOSPITAL_BASED_OUTPATIENT_CLINIC_OR_DEPARTMENT_OTHER)
Admission: RE | Disposition: A | Discharge status: Home / Self Care | Payer: Self-pay | Source: Ambulatory Visit | Attending: Urology

## 2023-06-24 ENCOUNTER — Ambulatory Visit (HOSPITAL_BASED_OUTPATIENT_CLINIC_OR_DEPARTMENT_OTHER): Payer: Medicare Other | Admitting: Certified Registered"

## 2023-06-24 ENCOUNTER — Encounter (HOSPITAL_BASED_OUTPATIENT_CLINIC_OR_DEPARTMENT_OTHER): Payer: Self-pay | Admitting: Urology

## 2023-06-24 DIAGNOSIS — Z96641 Presence of right artificial hip joint: Secondary | ICD-10-CM | POA: Diagnosis not present

## 2023-06-24 DIAGNOSIS — Z79899 Other long term (current) drug therapy: Secondary | ICD-10-CM | POA: Insufficient documentation

## 2023-06-24 DIAGNOSIS — N200 Calculus of kidney: Secondary | ICD-10-CM | POA: Diagnosis present

## 2023-06-24 DIAGNOSIS — K219 Gastro-esophageal reflux disease without esophagitis: Secondary | ICD-10-CM | POA: Diagnosis not present

## 2023-06-24 DIAGNOSIS — Z01818 Encounter for other preprocedural examination: Secondary | ICD-10-CM

## 2023-06-24 DIAGNOSIS — E039 Hypothyroidism, unspecified: Secondary | ICD-10-CM | POA: Insufficient documentation

## 2023-06-24 DIAGNOSIS — K760 Fatty (change of) liver, not elsewhere classified: Secondary | ICD-10-CM | POA: Diagnosis not present

## 2023-06-24 DIAGNOSIS — I10 Essential (primary) hypertension: Secondary | ICD-10-CM | POA: Insufficient documentation

## 2023-06-24 DIAGNOSIS — E669 Obesity, unspecified: Secondary | ICD-10-CM | POA: Diagnosis not present

## 2023-06-24 DIAGNOSIS — Z6830 Body mass index (BMI) 30.0-30.9, adult: Secondary | ICD-10-CM | POA: Diagnosis not present

## 2023-06-24 DIAGNOSIS — N202 Calculus of kidney with calculus of ureter: Secondary | ICD-10-CM | POA: Diagnosis not present

## 2023-06-24 DIAGNOSIS — K449 Diaphragmatic hernia without obstruction or gangrene: Secondary | ICD-10-CM | POA: Insufficient documentation

## 2023-06-24 HISTORY — PX: CYSTOSCOPY W/ RETROGRADES: SHX1426

## 2023-06-24 HISTORY — DX: Rheumatoid arthritis, unspecified: M06.9

## 2023-06-24 HISTORY — DX: Nontoxic single thyroid nodule: E04.1

## 2023-06-24 HISTORY — DX: Hyperlipidemia, unspecified: E78.5

## 2023-06-24 HISTORY — DX: Presence of spectacles and contact lenses: Z97.3

## 2023-06-24 HISTORY — DX: Other intervertebral disc degeneration, lumbosacral region without mention of lumbar back pain or lower extremity pain: M51.379

## 2023-06-24 HISTORY — DX: Stress incontinence (female) (male): N39.3

## 2023-06-24 HISTORY — DX: Personal history of urinary calculi: Z87.442

## 2023-06-24 HISTORY — DX: Calculus of ureter: N20.1

## 2023-06-24 HISTORY — DX: Personal history of adenomatous and serrated colon polyps: Z86.0101

## 2023-06-24 HISTORY — DX: Diaphragmatic hernia without obstruction or gangrene: K44.9

## 2023-06-24 HISTORY — DX: Anemia, unspecified: D64.9

## 2023-06-24 HISTORY — DX: Unspecified osteoarthritis, unspecified site: M19.90

## 2023-06-24 HISTORY — DX: Personal history of other endocrine, nutritional and metabolic disease: Z86.39

## 2023-06-24 HISTORY — PX: CYSTOSCOPY/URETEROSCOPY/HOLMIUM LASER/STENT PLACEMENT: SHX6546

## 2023-06-24 LAB — POCT I-STAT, CHEM 8
BUN: 17 mg/dL (ref 8–23)
Calcium, Ion: 1.42 mmol/L — ABNORMAL HIGH (ref 1.15–1.40)
Chloride: 107 mmol/L (ref 98–111)
Creatinine, Ser: 1 mg/dL (ref 0.44–1.00)
Glucose, Bld: 104 mg/dL — ABNORMAL HIGH (ref 70–99)
HCT: 38 % (ref 36.0–46.0)
Hemoglobin: 12.9 g/dL (ref 12.0–15.0)
Potassium: 3.9 mmol/L (ref 3.5–5.1)
Sodium: 142 mmol/L (ref 135–145)
TCO2: 24 mmol/L (ref 22–32)

## 2023-06-24 SURGERY — CYSTOSCOPY/URETEROSCOPY/HOLMIUM LASER/STENT PLACEMENT
Anesthesia: General | Site: Renal | Laterality: Right

## 2023-06-24 MED ORDER — LIDOCAINE 2% (20 MG/ML) 5 ML SYRINGE
INTRAMUSCULAR | Status: DC | PRN
  Administered 2023-06-24: 80 mg via INTRAVENOUS

## 2023-06-24 MED ORDER — DEXMEDETOMIDINE HCL IN NACL 80 MCG/20ML IV SOLN
INTRAVENOUS | Status: DC | PRN
  Administered 2023-06-24 (×2): 4 ug via INTRAVENOUS

## 2023-06-24 MED ORDER — CIPROFLOXACIN IN D5W 400 MG/200ML IV SOLN
INTRAVENOUS | Status: AC
  Filled 2023-06-24: qty 200

## 2023-06-24 MED ORDER — LIDOCAINE HCL (PF) 2 % IJ SOLN
INTRAMUSCULAR | Status: AC
  Filled 2023-06-24: qty 5

## 2023-06-24 MED ORDER — SODIUM CHLORIDE 0.9 % IR SOLN
Status: DC | PRN
  Administered 2023-06-24: 3000 mL

## 2023-06-24 MED ORDER — NITROFURANTOIN MONOHYD MACRO 100 MG PO CAPS: 100.0000 mg | ORAL_CAPSULE | Freq: Two times a day (BID) | ORAL | 0 refills | Status: AC

## 2023-06-24 MED ORDER — PROPOFOL 10 MG/ML IV BOLUS
INTRAVENOUS | Status: DC | PRN
  Administered 2023-06-24: 160 mg via INTRAVENOUS

## 2023-06-24 MED ORDER — FENTANYL CITRATE (PF) 100 MCG/2ML IJ SOLN
INTRAMUSCULAR | Status: DC | PRN
  Administered 2023-06-24 (×2): 25 ug via INTRAVENOUS
  Administered 2023-06-24: 50 ug via INTRAVENOUS

## 2023-06-24 MED ORDER — OXYCODONE HCL 5 MG PO TABS: 5.0000 mg | ORAL_TABLET | Freq: Four times a day (QID) | ORAL | 0 refills | Status: DC | PRN

## 2023-06-24 MED ORDER — CIPROFLOXACIN IN D5W 400 MG/200ML IV SOLN
400.0000 mg | INTRAVENOUS | Status: AC
  Administered 2023-06-24: 400 mg via INTRAVENOUS

## 2023-06-24 MED ORDER — PROPOFOL 10 MG/ML IV BOLUS
INTRAVENOUS | Status: AC
  Filled 2023-06-24: qty 20

## 2023-06-24 MED ORDER — 0.9 % SODIUM CHLORIDE (POUR BTL) OPTIME
TOPICAL | Status: DC | PRN
  Administered 2023-06-24: 500 mL

## 2023-06-24 MED ORDER — LACTATED RINGERS IV SOLN: INTRAVENOUS | Status: DC

## 2023-06-24 MED ORDER — ACETAMINOPHEN 500 MG PO TABS: 1000.0000 mg | ORAL_TABLET | Freq: Once | ORAL | Status: DC

## 2023-06-24 MED ORDER — DEXAMETHASONE SODIUM PHOSPHATE 10 MG/ML IJ SOLN
INTRAMUSCULAR | Status: DC | PRN
  Administered 2023-06-24: 10 mg via INTRAVENOUS

## 2023-06-24 MED ORDER — IOHEXOL 300 MG/ML  SOLN
INTRAMUSCULAR | Status: DC | PRN
  Administered 2023-06-24: 5 mL via URETHRAL

## 2023-06-24 MED ORDER — ONDANSETRON HCL 4 MG/2ML IJ SOLN
INTRAMUSCULAR | Status: DC | PRN
  Administered 2023-06-24: 4 mg via INTRAVENOUS

## 2023-06-24 MED ORDER — FENTANYL CITRATE (PF) 100 MCG/2ML IJ SOLN: 25.0000 ug | INTRAMUSCULAR | Status: DC | PRN

## 2023-06-24 MED ORDER — ONDANSETRON HCL 4 MG/2ML IJ SOLN
INTRAMUSCULAR | Status: AC
  Filled 2023-06-24: qty 2

## 2023-06-24 MED ORDER — FENTANYL CITRATE (PF) 100 MCG/2ML IJ SOLN
INTRAMUSCULAR | Status: AC
  Filled 2023-06-24: qty 2

## 2023-06-24 MED ORDER — AMISULPRIDE (ANTIEMETIC) 5 MG/2ML IV SOLN: 10.0000 mg | Freq: Once | INTRAVENOUS | Status: DC | PRN

## 2023-06-24 MED ORDER — DEXAMETHASONE SODIUM PHOSPHATE 10 MG/ML IJ SOLN
INTRAMUSCULAR | Status: AC
  Filled 2023-06-24: qty 1

## 2023-06-24 SURGICAL SUPPLY — 32 items
APL SKNCLS STERI-STRIP NONHPOA (GAUZE/BANDAGES/DRESSINGS)
BAG DRAIN URO-CYSTO SKYTR STRL (DRAIN) ×2 IMPLANT
BAG DRN UROCATH (DRAIN) ×2
BASKET ZERO TIP NITINOL 2.4FR (BASKET) IMPLANT
BENZOIN TINCTURE PRP APPL 2/3 (GAUZE/BANDAGES/DRESSINGS) IMPLANT
BSKT STON RTRVL ZERO TP 2.4FR (BASKET)
CATH URETERAL DUAL LUMEN 10F (MISCELLANEOUS) IMPLANT
CATH URETL OPEN END 6FR 70 (CATHETERS) ×2 IMPLANT
CLOTH BEACON ORANGE TIMEOUT ST (SAFETY) ×2 IMPLANT
COVER DOME SNAP 22 D (MISCELLANEOUS) ×2 IMPLANT
DRSG TEGADERM 4X4.75 (GAUZE/BANDAGES/DRESSINGS) IMPLANT
ELECT REM PT RETURN 9FT ADLT (ELECTROSURGICAL)
ELECTRODE REM PT RTRN 9FT ADLT (ELECTROSURGICAL) IMPLANT
GLOVE BIO SURGEON STRL SZ7 (GLOVE) ×2 IMPLANT
GLOVE BIO SURGEON STRL SZ8 (GLOVE) ×2 IMPLANT
GOWN STRL REUS W/TWL LRG LVL3 (GOWN DISPOSABLE) ×2 IMPLANT
GUIDEWIRE ANG ZIPWIRE 038X150 (WIRE) IMPLANT
GUIDEWIRE STR DUAL SENSOR (WIRE) IMPLANT
GUIDEWIRE ZIPWRE .038 STRAIGHT (WIRE) IMPLANT
IV NS IRRIG 3000ML ARTHROMATIC (IV SOLUTION) ×4 IMPLANT
KIT TURNOVER CYSTO (KITS) ×2 IMPLANT
LASER FIB FLEXIVA PULSE ID 365 (Laser) IMPLANT
MANIFOLD NEPTUNE II (INSTRUMENTS) ×2 IMPLANT
NS IRRIG 500ML POUR BTL (IV SOLUTION) ×2 IMPLANT
PACK CYSTO (CUSTOM PROCEDURE TRAY) ×2 IMPLANT
SHEATH NAVIGATOR HD 11/13X36 (SHEATH) IMPLANT
SLEEVE SCD COMPRESS KNEE MED (STOCKING) ×2 IMPLANT
STENT URET 6FRX24 CONTOUR (STENTS) IMPLANT
TRACTIP FLEXIVA PULS ID 200XHI (Laser) IMPLANT
TRACTIP FLEXIVA PULSE ID 200 (Laser) ×2
TUBE CONNECTING 12X1/4 (SUCTIONS) IMPLANT
TUBING UROLOGY SET (TUBING) IMPLANT

## 2023-06-24 NOTE — Anesthesia Postprocedure Evaluation (Signed)
Anesthesia Post Note  Patient: [name]  Procedure(s) Performed: RIGHT URETEROSCOPY/HOLMIUM LASER/STENT EXCHANGE (Right: Pelvis) CYSTOSCOPY WITH RETROGRADE PYELOGRAM (Renal)     Patient location during evaluation: PACU Anesthesia Type: General Level of consciousness: sedated and patient cooperative Pain management: pain level controlled Vital Signs Assessment: post-procedure vital signs reviewed and stable Respiratory status: spontaneous breathing Cardiovascular status: stable Anesthetic complications: no   No notable events documented.  Last Vitals:  Vitals:   06/24/23 1154 06/24/23 1200  BP: 131/72 (!) 148/82  Pulse: 65 61  Resp: 11 15  Temp:  36.5 C  SpO2: 97% 97%    Last Pain:  Vitals:   06/24/23 1200  TempSrc:   PainSc: 0-No pain                 Lewie Loron

## 2023-06-24 NOTE — Interval H&P Note (Signed)
History and Physical Interval Note:  06/24/2023 10:26 AM  Allison Whitaker  has presented today for surgery, with the diagnosis of RIGHT URETERAL STONE.  The various methods of treatment have been discussed with the patient and family. After consideration of risks, benefits and other options for treatment, the patient has consented to  Procedure(s) with comments: RIGHT URETEROSCOPY/HOLMIUM LASER/STENT PLACEMENT (Right) - 60 MINUTES NEEDED FOR CASE as a surgical intervention.  The patient's history has been reviewed, patient examined, no change in status, stable for surgery.  I have reviewed the patient's chart and labs.  Questions were answered to the patient's satisfaction.     Keyvon Herter L Donita Newland

## 2023-06-24 NOTE — Anesthesia Preprocedure Evaluation (Addendum)
Anesthesia Evaluation  Patient identified by MRN, date of birth, ID band Patient awake    Reviewed: Allergy & Precautions, NPO status , Patient's Chart, lab work & pertinent test results, reviewed documented beta blocker date and time   Airway Mallampati: III  TM Distance: >3 FB Neck ROM: Full    Dental  (+) Chipped, Dental Advisory Given, Caps,    Pulmonary neg pulmonary ROS   Pulmonary exam normal breath sounds clear to auscultation       Cardiovascular hypertension, Pt. 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   Abdominal  (+) + obese  Peds  Hematology  (+) Blood dyscrasia, anemia   Anesthesia Other Findings   Reproductive/Obstetrics                             Anesthesia Physical Anesthesia Plan  ASA: 2  Anesthesia Plan: General   Post-op Pain Management: Tylenol PO (pre-op)*   Induction: Intravenous and Cricoid pressure planned  PONV Risk Score and Plan: 4 or greater and Treatment may vary due to age or medical condition, Ondansetron and Dexamethasone  Airway Management Planned: LMA  Additional Equipment: None  Intra-op Plan:   Post-operative Plan: Extubation in OR  Informed Consent: I have reviewed the patients History and Physical, chart, labs and discussed the procedure including the risks, benefits and alternatives for the proposed anesthesia with the patient or authorized representative who has indicated his/her understanding and acceptance.     Dental advisory given  Plan Discussed with: CRNA  Anesthesia Plan Comments:          Anesthesia Quick Evaluation

## 2023-06-24 NOTE — Anesthesia Procedure Notes (Signed)
Procedure Name: LMA Insertion Date/Time: 06/24/2023 10:51 AM  Performed by: Kenyanna Grzesiak D, CRNAPre-anesthesia Checklist: Patient identified, Emergency Drugs available, Suction available and Patient being monitored Patient Re-evaluated:Patient Re-evaluated prior to induction Oxygen Delivery Method: Circle system utilized Preoxygenation: Pre-oxygenation with 100% oxygen Induction Type: IV induction Ventilation: Mask ventilation without difficulty LMA: LMA inserted LMA Size: 4.0 Tube type: Oral Number of attempts: 2 Placement Confirmation: positive ETCO2 and breath sounds checked- equal and bilateral Tube secured with: Tape Dental Injury: Teeth and Oropharynx as per pre-operative assessment

## 2023-06-24 NOTE — Op Note (Signed)
Operative Note  Preoperative diagnosis:  1.  Right renal stone  Postoperative diagnosis: 1.  same  Procedure(s): 1.  Right ureteroscopy with laser lithotripsy of stones 2. Cystoscopy  3. Right retrograde pyelogram 4. Right ureteral stent placement 5. Fluoroscopy with intraoperative interpretation  Surgeon: Irine Seal, MD  Assistants:  None  Anesthesia:  General  Complications:  None  EBL:  Minimal  Specimens: 1. none  Drains/Catheters: 1.  Right 6Fr x 24cm ureteral stent with tether string  Intraoperative findings:   Cystoscopy demonstrated unremarkable bladder Right Ureteroscopy demonstrated stone in lower pole Successful stent placement.  Indication:  Allison Whitaker is a 82 y.o. female who presents today for staged right ureteroscopy after stent placement for a proximal ureteral stone several weeks ago.  Description of procedure: After informed consent was obtained from the patient, the patient was identified and taken to the operating room and placed in the supine position.  General anesthesia was administered as well as perioperative IV antibiotics.  At the beginning of the case, a time-out was performed to properly identify the patient, the surgery to be performed, and the surgical site.  Sequential compression devices were applied to the lower extremities at the beginning of the case for DVT prophylaxis.  The patient was then placed in the dorsal lithotomy supine position, prepped and draped in sterile fashion.  We then passed the 21-French rigid cystoscope through the urethra and into the bladder under vision without any difficulty , noting a normal urethra.  A systematic evaluation of the bladder revealed no evidence of any suspicious bladder lesions.  Ureteral orifices were in normal position.    The distal aspect of the ureteral stent was seen protruding from the rightureteral orifice.  We then used the alligator-tooth forceps and grasped the distal end of the  ureteral stent and brought it out the urethral meatus while watching the proximal coil straighten out nicely on fluoroscopy. Through the ureteral stent, we then passed a 0.038 glide wire up to the level of the renal pelvis.  The ureteral stent was then removed, leaving the glide wire up the right ureter.  The cystoscope was withdrawn, and a dual lumen catheter was inserted over the glide wire into the distal ureter. A gentle retrograde pyelogram was performed, revealing a normal caliber ureter without any filling defects. There was no hydronephrosis of the collecting system. A 0.038 sensor wire was then passed up to the level of the renal pelvis and secured to the drape as a safety wire. The dual lumen was removed.  The flexible ureteroscope was advanced into the collecting system via zip wire. The collecting system was inspected. The calculus was identified at the lower pole. Using the 242 micron holmium laser fiber, the stone was dusted completely. With the ureteroscope in the kidney, a gentle pyelogram was performed to delineate the calyceal system and we evaluated the calyces systematically. We encountered no further stones. The rest of the stone fragments were very tiny and these were  irrigated away gently. The calyces were re-inspected and there were no significant stone fragment residual.   We then withdrew the ureteroscope back down the ureter, noting no evidence of any stones along the course of the ureter.  Prior to removing the ureteroscope, we did pass the Glidewire back up to the ureter to the renal pelvis.  Once the ureteroscope was removed, we then used the Glidewire under fluoroscopic guidance and passed up a 6-French x 24 cm double-pigtail ureteral stent up the ureter,  making sure that the proximal and distal ends coiled within the kidney and bladder respectively.  Note that we left a tether string attached to the distal end of the ureteral stent and it exited the urethral meatus with a short  tether.  The cystoscope was then advanced back into the bladder under vision.  We were able to see the distal stent coiling nicely within the bladder.  The bladder was then emptied with irrigation solution.  The cystoscope was then removed.    The patient tolerated the procedure well and there was no complication. Patient was awoken from anesthesia and taken to the recovery room in stable condition. I was present and scrubbed for the entirety of the case.  Plan:  Patient will be discharged home and may remove stent in 3 days   G. Irine Seal MD [facility]  Pager: [phone]

## 2023-06-24 NOTE — Discharge Instructions (Addendum)
[facility] [phone] Post Ureteroscopy With or Without Stent Instructions **Remove stent Friday morning by pulling on string** Definitions:  Ureter: The duct that transports urine from the kidney to the bladder. Stent:   A plastic hollow tube that is placed into the ureter, from the kidney to the bladder to prevent the ureter from swelling shut.  GENERAL INSTRUCTIONS:  Despite the fact that no skin incisions were used, the area around the ureter and bladder is raw and irritated. The stent is a foreign body which will further irritate the bladder wall. This irritation is manifested by increased frequency of urination, both day and night, and by an increase in the urge to urinate. In some, the urge to urinate is present almost always. Sometimes the urge is strong enough that you may not be able to stop yourself from urinating. The only real cure is to remove the stent and then give time for the bladder wall to heal which can't be done until the danger of the ureter swelling shut has passed, which varies.  You may see some blood in your urine while the stent is in place and a few days afterwards. Do not be alarmed, even if the urine was clear for a while. Get off your feet and drink lots of fluids until clearing occurs. If you start to pass clots or don't improve, call us.  DIET: You may return to your normal diet immediately. Because of the raw surface of your bladder, alcohol, spicy foods, acid type foods and drinks with caffeine may cause irritation or frequency and should be used in moderation. To keep your urine flowing freely and to avoid constipation, drink plenty of fluids during the day ( 8-10 glasses ). Tip: Avoid cranberry juice because it is very acidic.  ACTIVITY: Your physical activity doesn't need to be restricted. However, if you are very active, you may see some blood in your urine. We suggest that you reduce your activity under these circumstances until the  bleeding has stopped.  BOWELS: It is important to keep your bowels regular during the postoperative period. Straining with bowel movements can cause bleeding. A bowel movement every other day is reasonable. Use a mild laxative if needed, such as Milk of Magnesia 2-3 tablespoons, or 2 Dulcolax tablets. Call if you continue to have problems. If you have been taking narcotics for pain, before, during or after your surgery, you may be constipated. Take a laxative if necessary.   MEDICATION: You should resume your pre-surgery medications unless told not to. In addition you will often be given an antibiotic to prevent infection and likely several as needed medications for stent related discomfort. These should be taken as prescribed until the bottles are finished unless you are having an unusual reaction to one of the drugs.  PROBLEMS YOU SHOULD REPORT TO [place]: Fevers over 100.5 Fahrenheit. Heavy bleeding, or clots ( See above notes about blood in urine ). Inability to urinate. Drug reactions ( hives, rash, nausea, vomiting, diarrhea ). Severe burning or pain with urination that is not improving.    Post Anesthesia Home Care Instructions  Activity: Get plenty of rest for the remainder of the day. A responsible individual must stay with you for 24 hours following the procedure.  For the next 24 hours, DO NOT: -Drive a car -Advertising copywriter -Drink alcoholic beverages -Take any medication unless instructed by your physician -Make any legal decisions or sign important papers.  Meals: Start with liquid foods such as gelatin or  soup. Progress to regular foods as tolerated. Avoid greasy, spicy, heavy foods. If nausea and/or vomiting occur, drink only clear liquids until the nausea and/or vomiting subsides. Call your physician if vomiting continues.  Special Instructions/Symptoms: Your throat may feel dry or sore from the anesthesia or the breathing tube placed in your throat during surgery. If  this causes discomfort, gargle with warm salt water. The discomfort should disappear within 24 hours.

## 2023-06-24 NOTE — Transfer of Care (Signed)
Immediate Anesthesia Transfer of Care Note  Patient: Allison Whitaker  Procedure(s) Performed: RIGHT URETEROSCOPY/HOLMIUM LASER/STENT EXCHANGE (Right: Pelvis)  Patient Location: PACU  Anesthesia Type:General  Level of Consciousness: awake, alert , and oriented  Airway & Oxygen Therapy: Patient Spontanous Breathing and Patient connected to nasal cannula oxygen  Post-op Assessment: Report given to RN and Post -op Vital signs reviewed and stable  Post vital signs: Reviewed and stable  Last Vitals:  Vitals Value Taken Time  BP 118/71 06/24/23 1124  Temp    Pulse 66 06/24/23 1126  Resp 13 06/24/23 1126  SpO2 100 % 06/24/23 1126  Vitals shown include unvalidated device data.  Last Pain:  Vitals:   06/24/23 0854  TempSrc: Oral         Complications: No notable events documented.

## 2023-06-25 ENCOUNTER — Encounter (HOSPITAL_BASED_OUTPATIENT_CLINIC_OR_DEPARTMENT_OTHER): Payer: Self-pay | Admitting: Urology

## 2023-07-09 ENCOUNTER — Telehealth: Payer: Self-pay | Admitting: Physical Medicine and Rehabilitation

## 2023-07-09 NOTE — Telephone Encounter (Signed)
Is this for her left hip?  I cannot see where we have seen her for this?  If dr Alvester Morin is ok doing the injection, it is ok with me

## 2023-07-09 NOTE — Telephone Encounter (Signed)
Patient called needing to schedule an appointment with Dr. Alvester Morin. The number to contact patient is [phone]

## 2023-07-10 IMAGING — RF DG HIP (WITH OR WITHOUT PELVIS) 1V*R*
1 series · 2 of 2 positions shown · non-contrast
Comparison: Radiographs done on 04/09/2022

CLINICAL DATA: Fluoroscopic assistance for right hip arthroplasty

EXAM:
DG HIP (WITH OR WITHOUT PELVIS) 1V RIGHT

[Series 1: dg x-ray · 0.20mm/px · 2 of 2 slices shown]
[im 1/2]
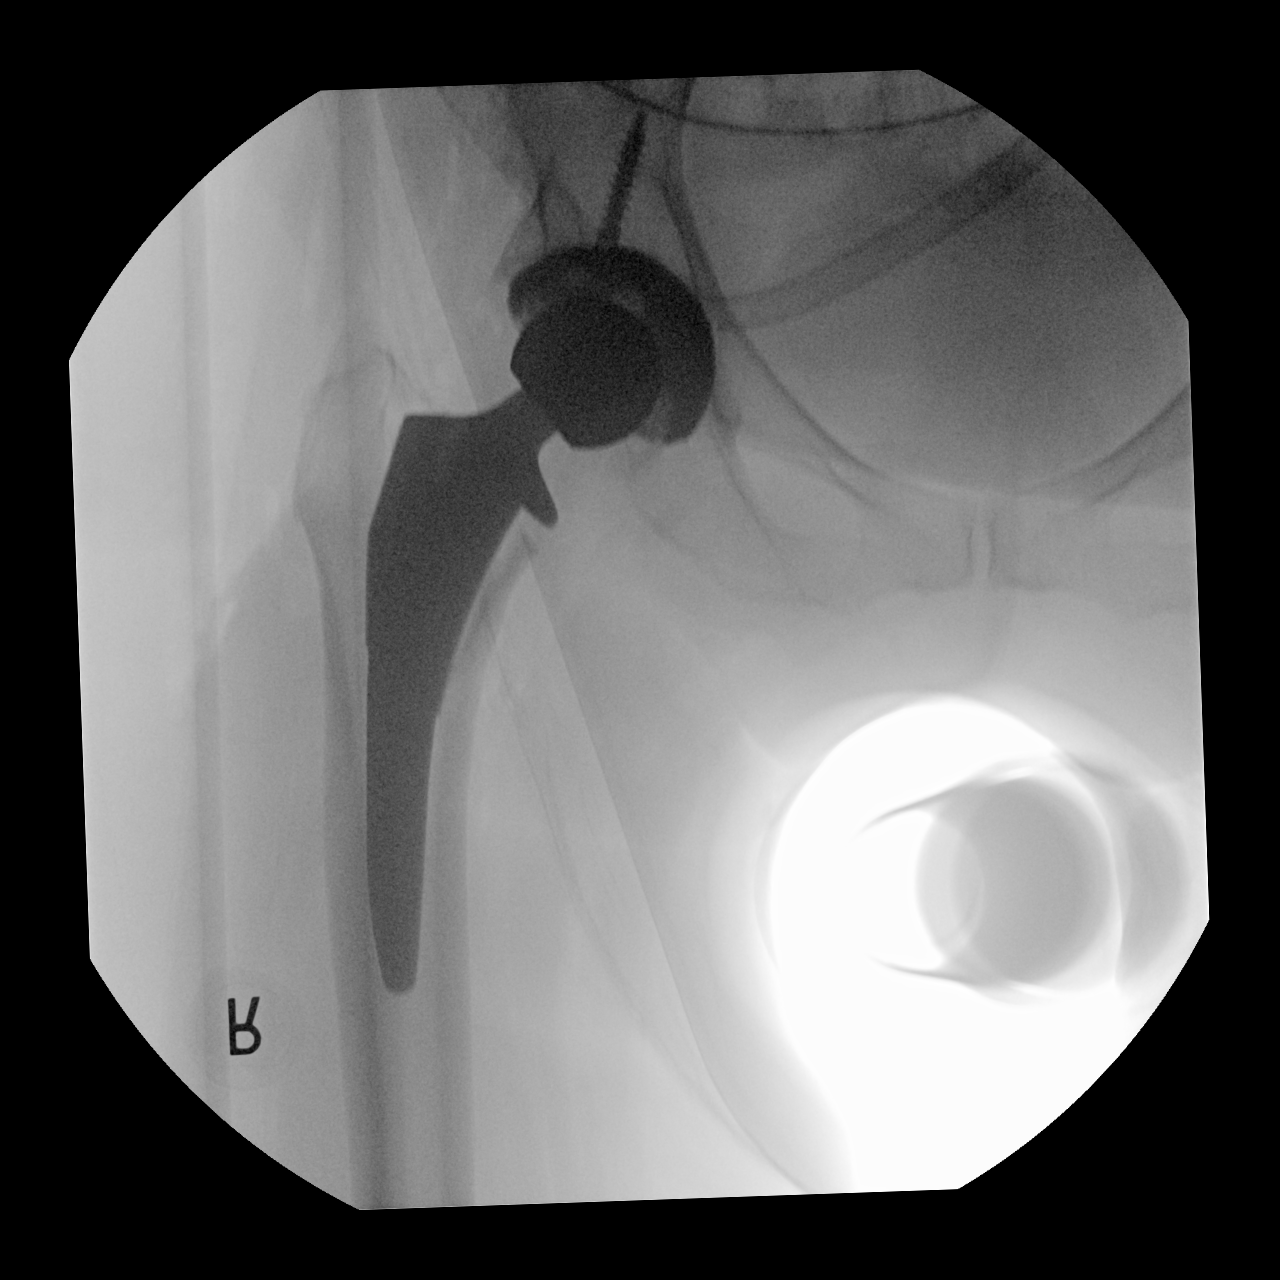
[im 2/2]
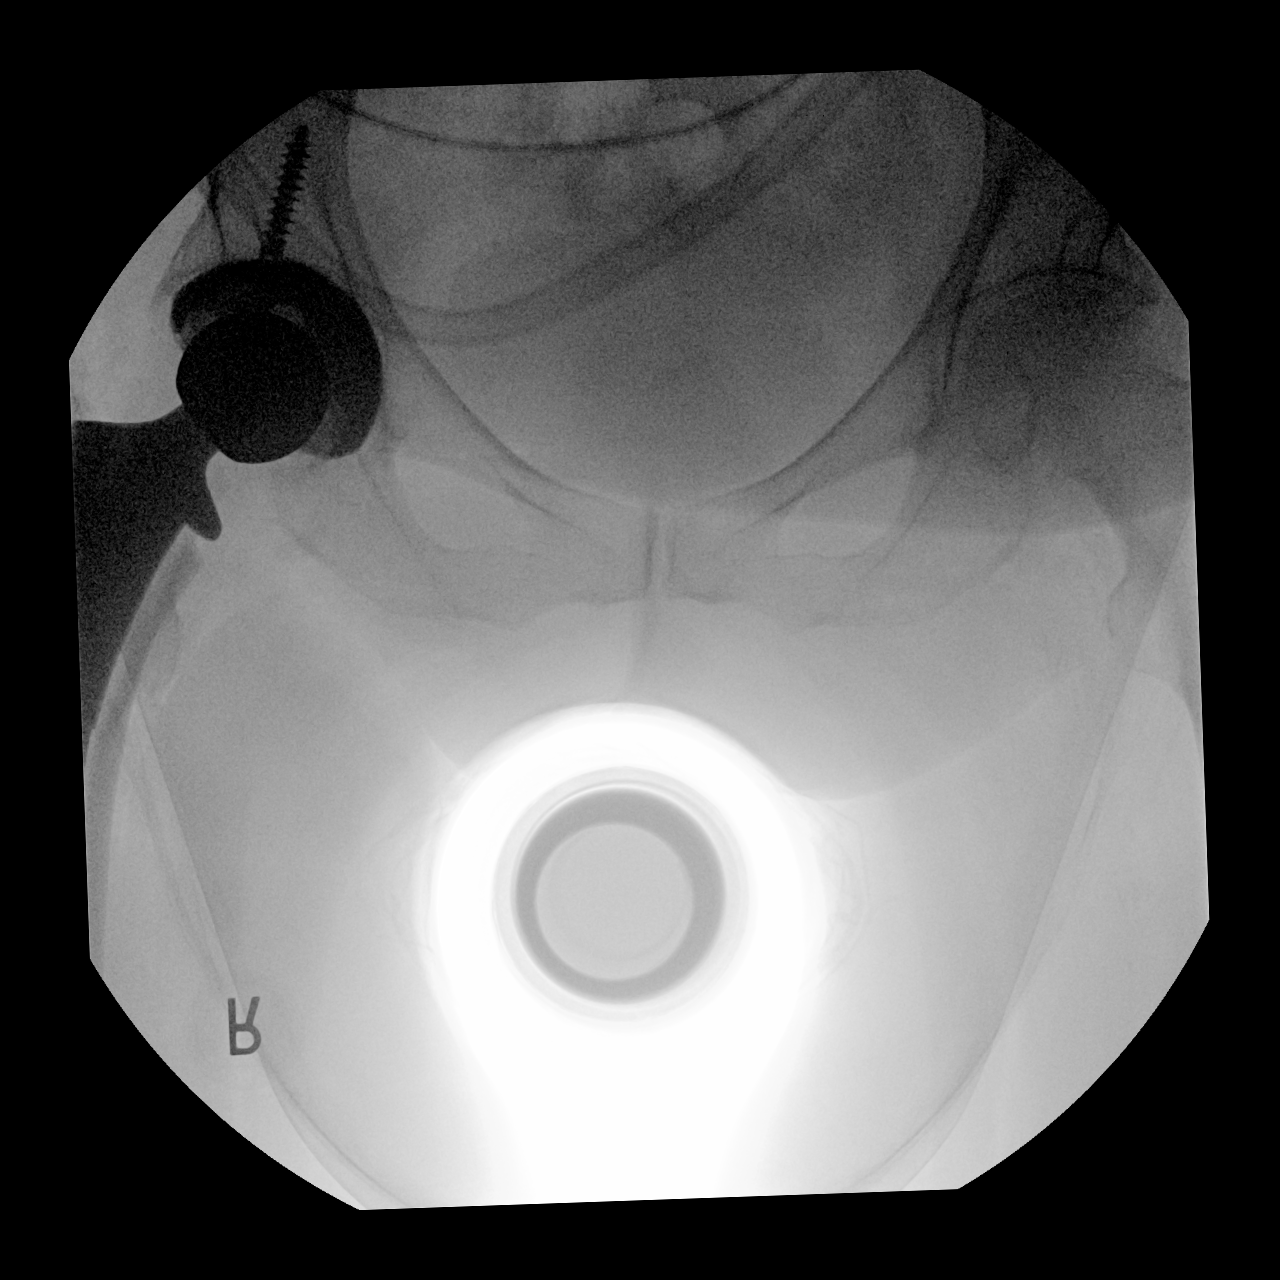

[2 of 2 positions shown; findings below may reference images not displayed]

FINDINGS: Fluoroscopic images show right hip arthroplasty. No fracture is
seen. Fluoroscopic time was 22 seconds. Radiation dose is 3.46 mGy.
IMPRESSION: Fluoroscopic assistance was provided for right hip arthroplasty.

## 2023-07-10 IMAGING — DX DG PORTABLE PELVIS
1 series · 1 of 1 positions shown · non-contrast
Comparison: 04/09/2022

CLINICAL DATA: Reason for exam: post op exam .Hip joint replacement
status. 13211

EXAM:
PORTABLE PELVIS 1-2 VIEWS

[pelvis ap]
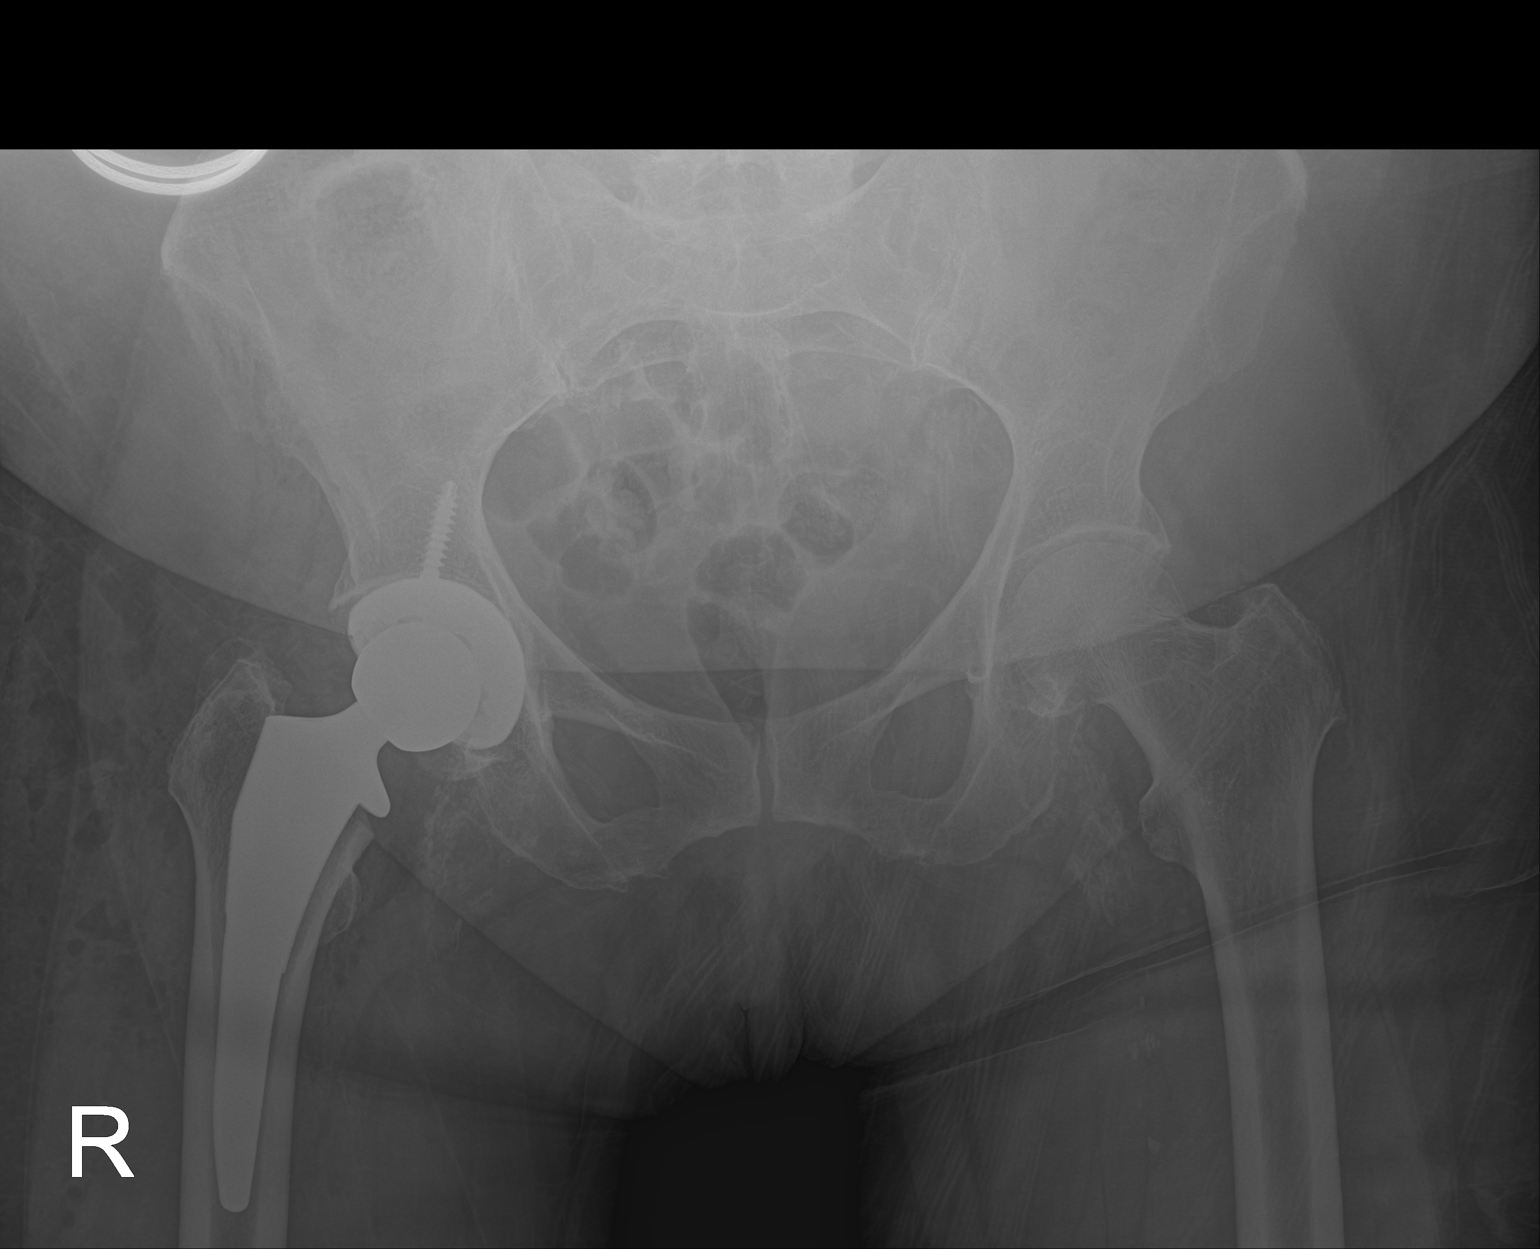

[1 of 1 positions shown; findings below may reference images not displayed]

FINDINGS: Interval right hip arthroplasty using non cemented components.
Single screw fixes the acetabular component. Components appear well
seated. No acute fracture or dislocation. Degenerative changes in
the left hip and lower lumbar spine. Soft tissue gas consistent with
recent surgery.
IMPRESSION: Right hip arthroplasty with components appearing well seated. No
acute complication.

## 2023-07-10 NOTE — Telephone Encounter (Signed)
With me is fine.  With me is fine.With me is fine but I'm out of town next week.

## 2023-07-15 ENCOUNTER — Telehealth: Payer: Self-pay | Admitting: Physical Medicine and Rehabilitation

## 2023-07-15 NOTE — Telephone Encounter (Signed)
Patient called. Would like an appointment with Dr. Alvester Morin. Cb#[phone]

## 2023-07-17 ENCOUNTER — Encounter: Payer: Self-pay | Admitting: Physical Medicine and Rehabilitation

## 2023-07-17 ENCOUNTER — Ambulatory Visit: Payer: Medicare Other | Admitting: Physical Medicine and Rehabilitation

## 2023-07-17 DIAGNOSIS — Z96641 Presence of right artificial hip joint: Secondary | ICD-10-CM | POA: Diagnosis not present

## 2023-07-17 DIAGNOSIS — M47816 Spondylosis without myelopathy or radiculopathy, lumbar region: Secondary | ICD-10-CM | POA: Diagnosis not present

## 2023-07-17 DIAGNOSIS — M5416 Radiculopathy, lumbar region: Secondary | ICD-10-CM | POA: Diagnosis not present

## 2023-07-17 DIAGNOSIS — M48062 Spinal stenosis, lumbar region with neurogenic claudication: Secondary | ICD-10-CM | POA: Diagnosis not present

## 2023-07-17 MED ORDER — TRAMADOL HCL 50 MG PO TABS: 50.0000 mg | ORAL_TABLET | Freq: Three times a day (TID) | ORAL | 0 refills | Status: DC | PRN

## 2023-07-17 NOTE — Telephone Encounter (Signed)
Per last note, patient needs to be scheduled with Dr. Roda Shutters

## 2023-07-17 NOTE — Progress Notes (Signed)
Functional Pain Scale - descriptive words and definitions  Moderate (4)   Constantly aware of pain, can complete ADLs with modification/sleep marginally affected at times/passive distraction is of no use, but active distraction gives some relief. Moderate range order  Average Pain 6-7, but can vary   Lower back pain that goes all the way across and radiates into the legs

## 2023-07-17 NOTE — Progress Notes (Signed)
Allison Whitaker - 82 y.o. female MRN [id number]  Date of birth: 08/15/1941  Office Visit Note: Visit Date: 07/17/2023 PCP: Geoffry Paradise, MD Referred by: Geoffry Paradise, MD  Subjective: Chief Complaint  Patient presents with   Lower Back - Pain   HPI: Allison Whitaker is a 82 y.o. female who comes in today As a self referral for evaluation of chronic, worsening and severe bilateral lower back pain, intermittent radiation to anterior thighs. Pain ongoing for several years, worse in morning after getting up. Pain worsens with standing and walking. She describes pain as sore and aching, currently rates as 7 out of 10. Some relief of pain with home exercise regimen, rest and use of medications.  Lumbar myelogram from 2022 exhibits severe multifactorial spinal stenosis with grade 1 anterolisthesis at L4-L5. Patient has undergone multiple epidural steroid injections in our office with intermittent relief, last injection was right L4 transforaminal epidural steroid injection on 10/08/2021. Patient reports it was difficult to tell how much injections were alleviating her pain at that time due to severe right hip issue. History of right total hip arthroplasty in 2023 with Dr. Glee Arvin We previously discussed referral to surgeon, this was back in 2023. Patient denies focal weakness. No recent trauma or falls.      Review of Systems  Musculoskeletal:  Positive for back pain.  Neurological:  Negative for tingling, sensory change, focal weakness and weakness.  All other systems reviewed and are negative.  Otherwise per HPI.  Assessment & Plan: Visit Diagnoses:    ICD-10-CM   1. Lumbar radiculopathy  M54.16 Ambulatory referral to Physical Medicine Rehab    2. Spinal stenosis of lumbar region with neurogenic claudication  M48.062 Ambulatory referral to Physical Medicine Rehab    3. Facet hypertrophy of lumbar region  M47.816 Ambulatory referral to Physical Medicine Rehab    4. Status post total  replacement of right hip  Z96.641 Ambulatory referral to Physical Medicine Rehab       Plan: Findings:  Chronic, worsening and severe bilateral lower back pain, intermittent radiation to anterior thighs. Patient continues to have severe pain despite good conservative therapies such as home exercise regimen, rest and use of medications. Patients clinical presentation and exam are consistent with neurogenic claudication as a result of spinal canal stenosis. There is multifactorial severe spinal canal stenosis at L4-L5. We discussed treatment options in detail today. Next step is to perform bilateral L4 transforaminal epidural steroid injection under fluoroscopic guidance. If good relief of pain with injection we can repeat this procedure infrequently as needed. If her pain persists we do feel she would benefit from surgical consultation to discuss options. I did refill Tramadol for her until we are able to get her in for injection. No red flag symptoms noted upon exam today.     Meds & Orders:  Meds ordered this encounter  Medications   traMADol (ULTRAM) 50 MG tablet    Sig: Take 1 tablet (50 mg total) by mouth every 8 (eight) hours as needed for moderate pain or severe pain.    Dispense:  20 tablet    Refill:  0    Orders Placed This Encounter  Procedures   Ambulatory referral to Physical Medicine Rehab    Follow-up: Return for Bilateral L4 transforaminal epidural steroid injection.   Procedures: No procedures performed      Clinical History: Narrative & Impression CLINICAL DATA:  Low back pain.  No previous lumbar surgery.   EXAM: LUMBAR  MYELOGRAM   CT LUMBAR SPINE WITH INTRATHECAL CONTRAST   FLUOROSCOPY TIME:  41 seconds; 330 uGym2 DAP   TECHNIQUE: The procedure, risks (including but not limited to bleeding, infection, organ damage ), benefits, and alternatives were explained to the patient. Questions regarding the procedure were encouraged and answered. The patient  understands and consents to the procedure. An appropriate entry site was determined under fluoroscopy. Operator donned sterile gloves and mask. Skin site was marked, prepped with Betadine, and draped in usual sterile fashion, and infiltrated locally with 1% lidocaine. A 22 gauge spinal needle was advanced into the thecal sac at L3 from a left parasagittal approach. Clear colorless CSF returned. 17 ml Omnipaque 180 were administered intrathecally for lumbar myelography, followed by axial CT scanning of the lumbar spine. I personally performed the lumbar puncture and administered the intrathecal contrast. I also personally supervised acquisition of the myelogram images. Coronal and sagittal reconstructions were generated from the axial scan.   COMPARISON:  None   FINDINGS: 5 non rib-bearing lumbar segments labeled L1-L5. Benign vertebral hemangiomas in the L1, L2, and L4 vertebral bodies. Negative for fracture.   T12-L1: Interspace unremarkable. Central canal and foramina patent.   L1-2: Conus terminates at the interspace. Mild circumferential calcified disc bulge. Mild right lateral recess encroachment. No significant spinal or foraminal stenosis.   L2-3: Mild narrowing of the interspace on the right. Moderate calcified posterior disc bulge right worse than left. There is mild facet DJD left worse than right and some thickening of the ligamentum flavum contributing to moderate central canal stenosis, right foraminal stenosis, and bilateral lateral recess stenosis.   L3-4: Calcified circumferential disc bulge. 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   L4-5: Advanced bilateral facet DJD with grade 1 anterolisthesis. Mild posterior calcified disc bulge contributes to severe multifactorial spinal stenosis and bilateral foraminal  encroachment.   L5-S1: Small left paracentral partially calcified protrusion approaches the left S1 nerve root. No spinal or foraminal stenosis.   Moderate calcified aortoiliac atherosclerosis (ICD10-170.0). 7 mm lower pole left renal calculus without hydronephrosis. Remainder visualized paraspinal soft tissues unremarkable.   IMPRESSION: 1. Mild disc bulge L1-2 with right lateral recess encroachment. 2. 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 3. Mild multifactorial spinal stenosis L3-4 with early foraminal encroachment bilaterally. 4. Severe multifactorial spinal stenosis and grade 1 anterolisthesis L4-5 with bilateral foraminal encroachment. 5. Left paracentral protrusion L5-S1 approaches the left S1 nerve root without compression. 6. Nonobstructive left nephrolithiasis.     Electronically Signed   By: Corlis Leak M.D.   On: 07/13/2021 14:27   She reports that she has never smoked. She has never used smokeless tobacco. No results for input(s): "HGBA1C", "LABURIC" in the last 8760 hours.  Objective:  VS:  HT:    WT:   BMI:     BP:   HR: bpm  TEMP: ( )  RESP:  Physical Exam Vitals and nursing note reviewed.  HENT:     Head: Normocephalic and atraumatic.     Right Ear: External ear normal.     Left Ear: External ear normal.     Nose: Nose normal.     Mouth/Throat:     Mouth: Mucous membranes are moist.  Eyes:     Extraocular Movements: Extraocular movements intact.  Cardiovascular:     Rate and Rhythm: Normal rate.  Pulses: Normal pulses.  Pulmonary:     Effort: Pulmonary effort is normal.  Abdominal:     General: Abdomen is flat. There is no distension.  Musculoskeletal:        General: Tenderness present.     Cervical back: Normal range of motion.     Comments: Patient rises from seated position to standing without difficulty. Good lumbar range of motion. No pain noted with facet loading. 5/5  strength noted with bilateral hip flexion, knee flexion/extension, ankle dorsiflexion/plantarflexion and EHL. No clonus noted bilaterally. No pain upon palpation of greater trochanters. No pain with internal/external rotation of bilateral hips. Sensation intact bilaterally. Negative slump test bilaterally. Ambulates without aid, gait steady.     Skin:    General: Skin is warm and dry.     Capillary Refill: Capillary refill takes less than 2 seconds.  Neurological:     General: No focal deficit present.     Mental Status: She is alert and oriented to person, place, and time.  Psychiatric:        Mood and Affect: Mood normal.        Behavior: Behavior normal.     Ortho Exam  Imaging: No results found.  Past Medical/Family/Surgical/Social History: Medications & Allergies reviewed per EMR, new medications updated. Patient Active Problem List   Diagnosis Date Noted   Status post total replacement of right hip 05/13/2022   Primary osteoarthritis of right hip 05/12/2022   Age-related osteoporosis without current pathological fracture 10/22/2021   Body mass index (BMI) 33.0-33.9, adult 10/22/2021   Hypercalcemia 10/22/2021   Primary osteoarthritis 10/22/2021   Rheumatoid arthritis (HCC) 12/23/2004   Past Medical History:  Diagnosis Date   Anemia    DDD (degenerative disc disease), lumbosacral    GERD (gastroesophageal reflux disease)    Hiatal hernia    History of adenomatous polyp of colon    History of hypercalcemia    History of kidney stones    Hyperlipidemia    Hypertension    Hypothyroidism    followed by pcp   OA (osteoarthritis)    RA (rheumatoid arthritis) [facility])    rheumatologist--- dr Dierdre Forth; per pt dx 1990s  multiple sites,  treated w/ methotrexate   Right ureteral calculus    SUI (stress urinary incontinence, female)    Thyroid nodule    single left nodule ;   bx 11-26-2012  benign   Wears contact lenses    Family History  Problem Relation Age of Onset    Stroke Mother    Colon cancer Neg Hx    Past Surgical History:  Procedure Laterality Date   COLONOSCOPY WITH PROPOFOL  02/03/2017   dr stark   CYSTOSCOPY W/ RETROGRADES  06/24/2023   Procedure: CYSTOSCOPY WITH RETROGRADE PYELOGRAM;  Surgeon: Despina Arias, MD;  Location: [facility];  Service: Urology;;   CYSTOSCOPY W/ URETERAL STENT PLACEMENT Right 06/06/2023   Procedure: CYSTOSCOPY WITH RIGHT RETROGRADE PYELOGRAM/RIGHT URETERAL STENT PLACEMENT;  Surgeon: Despina Arias, MD;  Location: [facility];  Service: Urology;  Laterality: Right;   CYSTOSCOPY/URETEROSCOPY/HOLMIUM LASER/STENT PLACEMENT Right 06/24/2023   Procedure: RIGHT URETEROSCOPY/HOLMIUM LASER/STENT EXCHANGE;  Surgeon: Despina Arias, MD;  [facility];  Service: Urology;  Laterality: Right;  60 MINUTES NEEDED FOR CASE   DORSAL COMPARTMENT RELEASE Right 10/06/2008   @ [facility] by dr Merlyn Lot;  released first dorsal compartement w/ tenosynvectomy   OOPHORECTOMY  1983   per pt unilateral ,  unsure which side  for dermoid cyst   TOTAL HIP ARTHROPLASTY Right 05/13/2022   Procedure: RIGHT TOTAL HIP ARTHROPLASTY ANTERIOR APPROACH;  Surgeon: Tarry Kos, MD;  Location: [facility];  Service: Orthopedics;  Laterality: Right;  3-C   WISDOM TOOTH EXTRACTION     age 27s   Social History   Occupational History   Not on file  Tobacco Use   Smoking status: Never   Smokeless tobacco: Never  Vaping Use   Vaping status: Never Used  Substance and Sexual Activity   Alcohol use: Not Currently    Comment: occasional   Drug use: Never   Sexual activity: Not on file

## 2023-08-11 ENCOUNTER — Other Ambulatory Visit: Payer: Self-pay

## 2023-08-11 ENCOUNTER — Ambulatory Visit: Payer: Medicare Other | Admitting: Physical Medicine and Rehabilitation

## 2023-08-11 VITALS — BP 132/82 | HR 83

## 2023-08-11 DIAGNOSIS — M5416 Radiculopathy, lumbar region: Secondary | ICD-10-CM

## 2023-08-11 MED ORDER — METHYLPREDNISOLONE ACETATE 80 MG/ML IJ SUSP
80.0000 mg | Freq: Once | INTRAMUSCULAR | Status: AC
Start: 2023-08-11 — End: 2023-08-11
  Administered 2023-08-11: 80 mg

## 2023-08-11 NOTE — Progress Notes (Signed)
Functional Pain Scale - descriptive words and definitions  Distracting (5)    Aware of pain/able to complete some ADL's but limited by pain/sleep is affected and active distractions are only slightly useful. Moderate range order  Average Pain 8   +Driver, -BT, -Dye Allergies.  Lower back pain on both sides with radiation in the legs to the knee

## 2023-08-11 NOTE — Patient Instructions (Signed)

## 2023-08-23 NOTE — Progress Notes (Signed)
Allison Whitaker - 82 y.o. female MRN [id number]  Date of birth: 03/05/1941  Office Visit Note: Visit Date: 08/11/2023 PCP: Geoffry Paradise, MD Referred by: Geoffry Paradise, MD  Subjective: Chief Complaint  Patient presents with   Lower Back - Pain   HPI:  Allison Whitaker is a 82 y.o. female who comes in today at the request of Ellin Goodie, FNP for planned Bilateral L4-5 Lumbar Transforaminal epidural steroid injection with fluoroscopic guidance.  The patient has failed conservative care including home exercise, medications, time and activity modification.  This injection will be diagnostic and hopefully therapeutic.  Please see requesting physician notes for further details and justification.   ROS Otherwise per HPI.  Assessment & Plan: Visit Diagnoses:    ICD-10-CM   1. Lumbar radiculopathy  M54.16 XR C-ARM NO REPORT    Epidural Steroid injection    methylPREDNISolone acetate (DEPO-MEDROL) injection 80 mg      Plan: No additional findings.   Meds & Orders:  Meds ordered this encounter  Medications   methylPREDNISolone acetate (DEPO-MEDROL) injection 80 mg    Orders Placed This Encounter  Procedures   XR C-ARM NO REPORT   Epidural Steroid injection    Follow-up: Return for visit to requesting provider as needed.   Procedures: No procedures performed      Clinical History: Narrative & Impression CLINICAL DATA:  Low back pain.  No previous lumbar surgery.   EXAM: LUMBAR MYELOGRAM   CT LUMBAR SPINE WITH INTRATHECAL CONTRAST   FLUOROSCOPY TIME:  41 seconds; 330 uGym2 DAP   TECHNIQUE: The procedure, risks (including but not limited to bleeding, infection, organ damage ), benefits, and alternatives were explained to the patient. Questions regarding the procedure were encouraged and answered. The patient understands and consents to the procedure. An appropriate entry site was determined under fluoroscopy. Operator donned sterile gloves and mask. Skin site was  marked, prepped with Betadine, and draped in usual sterile fashion, and infiltrated locally with 1% lidocaine. A 22 gauge spinal needle was advanced into the thecal sac at L3 from a left parasagittal approach. Clear colorless CSF returned. 17 ml Omnipaque 180 were administered intrathecally for lumbar myelography, followed by axial CT scanning of the lumbar spine. I personally performed the lumbar puncture and administered the intrathecal contrast. I also personally supervised acquisition of the myelogram images. Coronal and sagittal reconstructions were generated from the axial scan.   COMPARISON:  None   FINDINGS: 5 non rib-bearing lumbar segments labeled L1-L5. Benign vertebral hemangiomas in the L1, L2, and L4 vertebral bodies. Negative for fracture.   T12-L1: Interspace unremarkable. Central canal and foramina patent.   L1-2: Conus terminates at the interspace. Mild circumferential calcified disc bulge. Mild right lateral recess encroachment. No significant spinal or foraminal stenosis.   L2-3: Mild narrowing of the interspace on the right. Moderate calcified posterior disc bulge right worse than left. There is mild facet DJD left worse than right and some thickening of the ligamentum flavum contributing to moderate central canal stenosis, right foraminal stenosis, and bilateral lateral recess stenosis.   L3-4: Calcified circumferential disc bulge. 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   L4-5: Advanced bilateral facet DJD with grade 1 anterolisthesis. Mild posterior calcified disc bulge contributes to severe multifactorial spinal stenosis and bilateral foraminal encroachment.   L5-S1: Small left paracentral partially calcified protrusion approaches the left S1 nerve root. No spinal  or foraminal stenosis.   Moderate calcified  aortoiliac atherosclerosis (ICD10-170.0). 7 mm lower pole left renal calculus without hydronephrosis. Remainder visualized paraspinal soft tissues unremarkable.   IMPRESSION: 1. Mild disc bulge L1-2 with right lateral recess encroachment. 2. 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 3. Mild multifactorial spinal stenosis L3-4 with early foraminal encroachment bilaterally. 4. Severe multifactorial spinal stenosis and grade 1 anterolisthesis L4-5 with bilateral foraminal encroachment. 5. Left paracentral protrusion L5-S1 approaches the left S1 nerve root without compression. 6. Nonobstructive left nephrolithiasis.     Electronically Signed   By: Corlis Leak M.D.   On: 07/13/2021 14:27     Objective:  VS:  HT:    WT:   BMI:     BP:132/82  HR:83bpm  TEMP: ( )  RESP:  Physical Exam Vitals and nursing note reviewed.  Constitutional:      General: She is not in acute distress.    Appearance: Normal appearance. She is not ill-appearing.  HENT:     Head: Normocephalic and atraumatic.     Right Ear: External ear normal.     Left Ear: External ear normal.  Eyes:     Extraocular Movements: Extraocular movements intact.  Cardiovascular:     Rate and Rhythm: Normal rate.     Pulses: Normal pulses.  Pulmonary:     Effort: Pulmonary effort is normal. No respiratory distress.  Abdominal:     General: There is no distension.     Palpations: Abdomen is soft.  Musculoskeletal:        General: Tenderness present.     Cervical back: Neck supple.     Right lower leg: No edema.     Left lower leg: No edema.     Comments: Patient has good distal strength with no pain over the greater trochanters.  No clonus or focal weakness.  Skin:    Findings: No erythema, lesion or rash.  Neurological:     General: No focal deficit present.     Mental Status: She is alert and oriented to person, place, and time.     Sensory: No  sensory deficit.     Motor: No weakness or abnormal muscle tone.     Coordination: Coordination normal.  Psychiatric:        Mood and Affect: Mood normal.        Behavior: Behavior normal.      Imaging: No results found.

## 2023-08-28 ENCOUNTER — Telehealth: Payer: Medicare Other | Admitting: Physician Assistant

## 2023-08-28 ENCOUNTER — Encounter: Payer: Self-pay | Admitting: Physical Medicine and Rehabilitation

## 2023-08-28 DIAGNOSIS — G8929 Other chronic pain: Secondary | ICD-10-CM

## 2023-08-28 NOTE — Progress Notes (Signed)
Because this is an ongoing issue and you are followed by specialists with ongoing treatment regimen, I feel your condition warrants further evaluation and I recommend that you be seen in a face to face visit. I would contact your providers to make them aware so they can make adjustment in your medications/recommendations.   NOTE: There will be NO CHARGE for this eVisit   If you are having a true medical emergency please call 911.      For an urgent face to face visit, [place] has eight urgent care centers for your convenience:   NEW!! [facility] at [facility] Get Driving Directions [phone] [address] [place], [address]    [facility] Center at [facility] Get Driving Directions [phone] [address] [place], [place] [address]   [facility]) Get Driving Directions [phone] [address] [place], [place] [address]  [facility]gent Care Center [facility] [place]) Get Driving Directions [phone] [address] [place],  [place]  [address]  [facility] Health Urgent Care Center [facility] - at [employer]  [phone] [place],  [place] [address]   [facility] Health Urgent Care at [address] [facility] Get Driving Directions [phone] [address] [place], [place] [address]   [facility] Health Urgent Care at [facility] Get Driving Directions  [phone] [address] [place], [place] [address]   [facility] Health Urgent Care at [facility] Directions [phone] [address], [address] [place], [place] [address]  Your MyChart E-visit questionnaire answers were reviewed by a board certified advanced clinical practitioner to complete your personal care plan based on your specific symptoms.  Thank you for using e-Visits.

## 2023-08-29 ENCOUNTER — Encounter: Payer: Self-pay | Admitting: Orthopaedic Surgery

## 2023-09-01 ENCOUNTER — Other Ambulatory Visit: Payer: Self-pay | Admitting: Orthopaedic Surgery

## 2023-09-01 MED ORDER — PREDNISONE 10 MG (21) PO TBPK: ORAL_TABLET | ORAL | 3 refills | Status: DC

## 2023-10-13 ENCOUNTER — Other Ambulatory Visit: Payer: Self-pay | Admitting: Internal Medicine

## 2023-10-13 DIAGNOSIS — M48061 Spinal stenosis, lumbar region without neurogenic claudication: Secondary | ICD-10-CM

## 2023-10-13 DIAGNOSIS — M4807 Spinal stenosis, lumbosacral region: Secondary | ICD-10-CM

## 2023-10-13 DIAGNOSIS — M5416 Radiculopathy, lumbar region: Secondary | ICD-10-CM

## 2023-10-15 ENCOUNTER — Ambulatory Visit
Admission: RE | Admit: 2023-10-15 | Discharge: 2023-10-15 | Disposition: A | Discharge status: Home / Self Care | Payer: Medicare Other | Source: Ambulatory Visit | Attending: Internal Medicine | Admitting: Internal Medicine

## 2023-10-15 DIAGNOSIS — M5416 Radiculopathy, lumbar region: Secondary | ICD-10-CM

## 2023-10-15 DIAGNOSIS — M48061 Spinal stenosis, lumbar region without neurogenic claudication: Secondary | ICD-10-CM

## 2023-10-15 DIAGNOSIS — M4807 Spinal stenosis, lumbosacral region: Secondary | ICD-10-CM

## 2023-10-15 MED ORDER — GADOPICLENOL 0.5 MMOL/ML IV SOLN
8.0000 mL | Freq: Once | INTRAVENOUS | Status: AC | PRN
  Administered 2023-10-15: 8 mL via INTRAVENOUS

## 2023-11-05 ENCOUNTER — Other Ambulatory Visit: Payer: Self-pay | Admitting: Neurosurgery

## 2023-11-28 NOTE — Progress Notes (Signed)
Surgical Instructions   Your procedure is scheduled on Thursday December 04, 2023. Report to [facility] Main Entrance "A" at 8:00 A.M., then check in with the Admitting office. Any questions or running late day of surgery: call [phone]  Questions prior to your surgery date: call [phone], Monday-Friday, 8am-4pm. If you experience any cold or flu symptoms such as cough, fever, chills, shortness of breath, etc. between now and your scheduled surgery, please notify us at the above number.     Remember:  Do not eat or drink after midnight the night before your surgery  Take these medicines the morning of surgery with A SIP OF WATER  omeprazole (PRILOSEC)  Polyvinyl Alcohol-Povidone (REFRESH OP)   May take these medicines IF NEEDED: ondansetron (ZOFRAN)    One week prior to surgery, STOP taking any Aspirin (unless otherwise instructed by your surgeon) Aleve, Naproxen, Ibuprofen, Motrin, Advil, Goody's, BC's, all herbal medications, fish oil, and non-prescription vitamins.  This includes your diclofenac Sodium (VOLTAREN) 1 % GEL and your meloxicam (MOBIC).                        Do NOT Smoke (Tobacco/Vaping) for 24 hours prior to your procedure.  If you use a CPAP at night, you may bring your mask/headgear for your overnight stay.   You will be asked to remove any contacts, glasses, piercing's, hearing aid's, dentures/partials prior to surgery. Please bring cases for these items if needed.    Patients discharged the day of surgery will not be allowed to drive home, and someone needs to stay with them for 24 hours.  SURGICAL WAITING ROOM VISITATION Patients may have no more than 2 support people in the waiting area - these visitors may rotate.   Pre-op nurse will coordinate an appropriate time for 1 ADULT support person, who may not rotate, to accompany patient in pre-op.  Children under the age of 49 must have an adult with them who is not the patient and must remain in the  main waiting area with an adult.  If the patient needs to stay at the hospital during part of their recovery, the visitor guidelines for inpatient rooms apply.  Please refer to the [facility] website for the visitor guidelines for any additional information.   If you received a COVID test during your pre-op visit  it is requested that you wear a mask when out in public, stay away from anyone that may not be feeling well and notify your surgeon if you develop symptoms. If you have been in contact with anyone that has tested positive in the last 10 days please notify you surgeon.      Pre-operative 5 CHG Bathing Instructions   You can play a key role in reducing the risk of infection after surgery. Your skin needs to be as free of germs as possible. You can reduce the number of germs on your skin by washing with CHG (chlorhexidine gluconate) soap before surgery. CHG is an antiseptic soap that kills germs and continues to kill germs even after washing.   DO NOT use if you have an allergy to chlorhexidine/CHG or antibacterial soaps. If your skin becomes reddened or irritated, stop using the CHG and notify one of our RNs at [phone].   Please shower with the CHG soap starting 4 days before surgery using the following schedule:     Please keep in mind the following:  DO NOT shave, including legs and underarms, starting  the day of your first shower.   You may shave your face at any point before/day of surgery.  Place clean sheets on your bed the day you start using CHG soap. Use a clean washcloth (not used since being washed) for each shower. DO NOT sleep with pets once you start using the CHG.   CHG Shower Instructions:  Wash your face and private area with normal soap. If you choose to wash your hair, wash first with your normal shampoo.  After you use shampoo/soap, rinse your hair and body thoroughly to remove shampoo/soap residue.  Turn the water OFF and apply about 3 tablespoons  (45 ml) of CHG soap to a CLEAN washcloth.  Apply CHG soap ONLY FROM YOUR NECK DOWN TO YOUR TOES (washing for 3-5 minutes)  DO NOT use CHG soap on face, private areas, open wounds, or sores.  Pay special attention to the area where your surgery is being performed.  If you are having back surgery, having someone wash your back for you may be helpful. Wait 2 minutes after CHG soap is applied, then you may rinse off the CHG soap.  Pat dry with a clean towel  Put on clean clothes/pajamas   If you choose to wear lotion, please use ONLY the CHG-compatible lotions on the back of this paper.   Additional instructions for the day of surgery: DO NOT APPLY any lotions, deodorants or perfumes.   Do not bring valuables to the hospital. [facility] is not responsible for any belongings/valuables. Do not wear nail polish, gel polish, artificial nails, or any other type of covering on natural nails (fingers and toes) Do not wear jewelry or makeup Put on clean/comfortable clothes.  Please brush your teeth.  Ask your nurse before applying any prescription medications to the skin.     CHG Compatible Lotions   Aveeno Moisturizing lotion  Cetaphil Moisturizing Cream  Cetaphil Moisturizing Lotion  Clairol Herbal Essence Moisturizing Lotion, Dry Skin  Clairol Herbal Essence Moisturizing Lotion, Extra Dry Skin  Clairol Herbal Essence Moisturizing Lotion, Normal Skin  Curel Age Defying Therapeutic Moisturizing Lotion with Alpha Hydroxy  Curel Extreme Care Body Lotion  Curel Soothing Hands Moisturizing Hand Lotion  Curel Therapeutic Moisturizing Cream, Fragrance-Free  Curel Therapeutic Moisturizing Lotion, Fragrance-Free  Curel Therapeutic Moisturizing Lotion, Original Formula  Eucerin Daily Replenishing Lotion  Eucerin Dry Skin Therapy Plus Alpha Hydroxy Crme  Eucerin Dry Skin Therapy Plus Alpha Hydroxy Lotion  Eucerin Original Crme  Eucerin Original Lotion  Eucerin Plus Crme Eucerin Plus Lotion   Eucerin TriLipid Replenishing Lotion  Keri Anti-Bacterial Hand Lotion  Keri Deep Conditioning Original Lotion Dry Skin Formula Softly Scented  Keri Deep Conditioning Original Lotion, Fragrance Free Sensitive Skin Formula  Keri Lotion Fast Absorbing Fragrance Free Sensitive Skin Formula  Keri Lotion Fast Absorbing Softly Scented Dry Skin Formula  Keri Original Lotion  Keri Skin Renewal Lotion Keri Silky Smooth Lotion  Keri Silky Smooth Sensitive Skin Lotion  Nivea Body Creamy Conditioning Oil  Nivea Body Extra Enriched Lotion  Nivea Body Original Lotion  Nivea Body Sheer Moisturizing Lotion Nivea Crme  Nivea Skin Firming Lotion  NutraDerm 30 Skin Lotion  NutraDerm Skin Lotion  NutraDerm Therapeutic Skin Cream  NutraDerm Therapeutic Skin Lotion  ProShield Protective Hand Cream  Provon moisturizing lotion  Please read over the following fact sheets that you were given.

## 2023-12-01 ENCOUNTER — Other Ambulatory Visit: Payer: Self-pay

## 2023-12-01 ENCOUNTER — Encounter (HOSPITAL_COMMUNITY): Payer: Self-pay

## 2023-12-01 ENCOUNTER — Encounter (HOSPITAL_COMMUNITY)
Admission: RE | Admit: 2023-12-01 | Discharge: 2023-12-01 | Disposition: A | Discharge status: Home / Self Care | Payer: Medicare Other | Source: Ambulatory Visit | Attending: Neurosurgery | Admitting: Neurosurgery

## 2023-12-01 VITALS — BP 130/61 | HR 77 | Temp 98.4°F | Resp 18 | Ht 65.0 in | Wt 184.2 lb

## 2023-12-01 DIAGNOSIS — I2510 Atherosclerotic heart disease of native coronary artery without angina pectoris: Secondary | ICD-10-CM | POA: Diagnosis not present

## 2023-12-01 DIAGNOSIS — Z01812 Encounter for preprocedural laboratory examination: Secondary | ICD-10-CM | POA: Diagnosis present

## 2023-12-01 DIAGNOSIS — Z01818 Encounter for other preprocedural examination: Secondary | ICD-10-CM

## 2023-12-01 LAB — BASIC METABOLIC PANEL
Anion gap: 7 (ref 5–15)
BUN: 18 mg/dL (ref 8–23)
CO2: 24 mmol/L (ref 22–32)
Calcium: 10.2 mg/dL (ref 8.9–10.3)
Chloride: 109 mmol/L (ref 98–111)
Creatinine, Ser: 1.05 mg/dL — ABNORMAL HIGH (ref 0.44–1.00)
GFR, Estimated: 54 mL/min — ABNORMAL LOW (ref >60)
Glucose, Bld: 104 mg/dL — ABNORMAL HIGH (ref 70–99)
Potassium: 3.5 mmol/L (ref 3.5–5.1)
Sodium: 140 mmol/L (ref 135–145)

## 2023-12-01 LAB — CBC
HCT: 39.4 % (ref 36.0–46.0)
Hemoglobin: 12.8 g/dL (ref 12.0–15.0)
MCH: 31.3 pg (ref 26.0–34.0)
MCHC: 32.5 g/dL (ref 30.0–36.0)
MCV: 96.3 fL (ref 80.0–100.0)
Platelets: 283 10*3/uL (ref 150–400)
RBC: 4.09 MIL/uL (ref 3.87–5.11)
RDW: 13.4 % (ref 11.5–15.5)
WBC: 6.4 10*3/uL (ref 4.0–10.5)
nRBC: 0 % (ref 0.0–0.2)

## 2023-12-01 LAB — TYPE AND SCREEN
ABO/RH(D): O POS
Antibody Screen: NEGATIVE

## 2023-12-01 LAB — SURGICAL PCR SCREEN
MRSA, PCR: NEGATIVE
Staphylococcus aureus: POSITIVE — AB

## 2023-12-01 NOTE — Progress Notes (Signed)
PCP - Dr Jacky Kindle Cardiologist - denies  PPM/ICD - denies   Chest x-ray - N/A EKG - 06/09/23 Stress Test - denies ECHO - denies Cardiac Cath - denies  Sleep Study - denies   Fasting Blood Sugar - N/A  Last dose of GLP1 agonist-  N/A   Blood Thinner Instructions: N/A Aspirin Instructions: N/A  Pt to reach out to Dr. Lovell Sheehan' office regarding Methotrexate.   ERAS Protcol - NPO order   COVID TEST- N/A   Anesthesia review: no  Patient denies shortness of breath, fever, cough and chest pain at PAT appointment   All instructions explained to the patient, with a verbal understanding of the material. Patient agrees to go over the instructions while at home for a better understanding.  The opportunity to ask questions was provided.

## 2023-12-04 ENCOUNTER — Encounter (HOSPITAL_COMMUNITY)
Admission: RE | Disposition: A | Discharge status: Home / Self Care | Payer: Self-pay | Source: Ambulatory Visit | Attending: Neurosurgery

## 2023-12-04 ENCOUNTER — Ambulatory Visit (HOSPITAL_BASED_OUTPATIENT_CLINIC_OR_DEPARTMENT_OTHER): Payer: Medicare Other | Admitting: Certified Registered Nurse Anesthetist

## 2023-12-04 ENCOUNTER — Encounter (HOSPITAL_COMMUNITY): Payer: Self-pay | Admitting: Neurosurgery

## 2023-12-04 ENCOUNTER — Ambulatory Visit (HOSPITAL_COMMUNITY): Payer: Medicare Other

## 2023-12-04 ENCOUNTER — Ambulatory Visit (HOSPITAL_COMMUNITY): Payer: Medicare Other | Admitting: Certified Registered Nurse Anesthetist

## 2023-12-04 ENCOUNTER — Ambulatory Visit (HOSPITAL_COMMUNITY)
Admission: RE | Admit: 2023-12-04 | Discharge: 2023-12-05 | Disposition: A | Discharge status: Home / Self Care | Payer: Medicare Other | Source: Ambulatory Visit | Attending: Neurosurgery | Admitting: Neurosurgery

## 2023-12-04 ENCOUNTER — Other Ambulatory Visit: Payer: Self-pay

## 2023-12-04 DIAGNOSIS — K219 Gastro-esophageal reflux disease without esophagitis: Secondary | ICD-10-CM | POA: Insufficient documentation

## 2023-12-04 DIAGNOSIS — I10 Essential (primary) hypertension: Secondary | ICD-10-CM | POA: Insufficient documentation

## 2023-12-04 DIAGNOSIS — M069 Rheumatoid arthritis, unspecified: Secondary | ICD-10-CM | POA: Diagnosis not present

## 2023-12-04 DIAGNOSIS — M48062 Spinal stenosis, lumbar region with neurogenic claudication: Secondary | ICD-10-CM | POA: Insufficient documentation

## 2023-12-04 DIAGNOSIS — M4316 Spondylolisthesis, lumbar region: Secondary | ICD-10-CM | POA: Diagnosis present

## 2023-12-04 DIAGNOSIS — M4726 Other spondylosis with radiculopathy, lumbar region: Secondary | ICD-10-CM | POA: Diagnosis not present

## 2023-12-04 SURGERY — POSTERIOR LUMBAR FUSION 1 LEVEL
Anesthesia: General | Site: Spine Lumbar

## 2023-12-04 MED ORDER — ACETAMINOPHEN 500 MG PO TABS
1000.0000 mg | ORAL_TABLET | Freq: Four times a day (QID) | ORAL | Status: DC
  Administered 2023-12-04 – 2023-12-05 (×3): 1000 mg via ORAL
  Filled 2023-12-04 (×3): qty 2

## 2023-12-04 MED ORDER — CHLORHEXIDINE GLUCONATE CLOTH 2 % EX PADS: 6.0000 | MEDICATED_PAD | Freq: Once | CUTANEOUS | Status: DC

## 2023-12-04 MED ORDER — CHLORHEXIDINE GLUCONATE CLOTH 2 % EX PADS
6.0000 | MEDICATED_PAD | Freq: Every day | CUTANEOUS | Status: DC
  Administered 2023-12-04 – 2023-12-05 (×2): 6 via TOPICAL

## 2023-12-04 MED ORDER — THROMBIN 5000 UNITS EX SOLR
CUTANEOUS | Status: AC
  Filled 2023-12-04: qty 5000

## 2023-12-04 MED ORDER — DEXAMETHASONE SODIUM PHOSPHATE 10 MG/ML IJ SOLN
INTRAMUSCULAR | Status: DC | PRN
  Administered 2023-12-04: 10 mg via INTRAVENOUS

## 2023-12-04 MED ORDER — CHLORHEXIDINE GLUCONATE 0.12 % MT SOLN
OROMUCOSAL | Status: AC
  Administered 2023-12-04: 15 mL via OROMUCOSAL
  Filled 2023-12-04: qty 15

## 2023-12-04 MED ORDER — ONDANSETRON HCL 4 MG/2ML IJ SOLN
INTRAMUSCULAR | Status: AC
  Filled 2023-12-04: qty 2

## 2023-12-04 MED ORDER — PANTOPRAZOLE SODIUM 40 MG PO TBEC
40.0000 mg | DELAYED_RELEASE_TABLET | Freq: Every day | ORAL | Status: DC
  Administered 2023-12-05: 40 mg via ORAL
  Filled 2023-12-04: qty 1

## 2023-12-04 MED ORDER — ORAL CARE MOUTH RINSE: 15.0000 mL | Freq: Once | OROMUCOSAL | Status: AC

## 2023-12-04 MED ORDER — FENTANYL CITRATE (PF) 250 MCG/5ML IJ SOLN
INTRAMUSCULAR | Status: DC | PRN
  Administered 2023-12-04: 50 ug via INTRAVENOUS
  Administered 2023-12-04: 25 ug via INTRAVENOUS
  Administered 2023-12-04: 100 ug via INTRAVENOUS
  Administered 2023-12-04: 25 ug via INTRAVENOUS

## 2023-12-04 MED ORDER — PROPOFOL 10 MG/ML IV BOLUS
INTRAVENOUS | Status: AC
  Filled 2023-12-04: qty 20

## 2023-12-04 MED ORDER — FENTANYL CITRATE (PF) 250 MCG/5ML IJ SOLN
INTRAMUSCULAR | Status: AC
  Filled 2023-12-04: qty 5

## 2023-12-04 MED ORDER — LIDOCAINE 2% (20 MG/ML) 5 ML SYRINGE
INTRAMUSCULAR | Status: AC
  Filled 2023-12-04: qty 5

## 2023-12-04 MED ORDER — SODIUM CHLORIDE 0.9 % IV SOLN: 250.0000 mL | INTRAVENOUS | Status: DC

## 2023-12-04 MED ORDER — MENTHOL 3 MG MT LOZG: 1.0000 | LOZENGE | OROMUCOSAL | Status: DC | PRN

## 2023-12-04 MED ORDER — CYCLOBENZAPRINE HCL 10 MG PO TABS
10.0000 mg | ORAL_TABLET | Freq: Three times a day (TID) | ORAL | Status: DC | PRN
  Administered 2023-12-05: 10 mg via ORAL
  Filled 2023-12-04: qty 1

## 2023-12-04 MED ORDER — BUPIVACAINE LIPOSOME 1.3 % IJ SUSP
INTRAMUSCULAR | Status: DC | PRN
  Administered 2023-12-04: 20 mL

## 2023-12-04 MED ORDER — SUGAMMADEX SODIUM 200 MG/2ML IV SOLN
INTRAVENOUS | Status: DC | PRN
  Administered 2023-12-04: 200 mg via INTRAVENOUS

## 2023-12-04 MED ORDER — ACETAMINOPHEN 10 MG/ML IV SOLN
INTRAVENOUS | Status: DC | PRN
  Administered 2023-12-04: 1000 mg via INTRAVENOUS

## 2023-12-04 MED ORDER — BACITRACIN ZINC 500 UNIT/GM EX OINT
TOPICAL_OINTMENT | CUTANEOUS | Status: DC | PRN
  Administered 2023-12-04: 1 via TOPICAL

## 2023-12-04 MED ORDER — FENTANYL CITRATE (PF) 100 MCG/2ML IJ SOLN
INTRAMUSCULAR | Status: AC
  Filled 2023-12-04: qty 2

## 2023-12-04 MED ORDER — BISACODYL 10 MG RE SUPP: 10.0000 mg | Freq: Every day | RECTAL | Status: DC | PRN

## 2023-12-04 MED ORDER — SENNA 8.6 MG PO TABS
1.0000 | ORAL_TABLET | Freq: Two times a day (BID) | ORAL | Status: DC | PRN
  Administered 2023-12-05: 8.6 mg via ORAL
  Filled 2023-12-04: qty 1

## 2023-12-04 MED ORDER — SODIUM CHLORIDE 0.9% FLUSH
3.0000 mL | Freq: Two times a day (BID) | INTRAVENOUS | Status: DC
  Administered 2023-12-04: 3 mL via INTRAVENOUS

## 2023-12-04 MED ORDER — HYDROCHLOROTHIAZIDE 25 MG PO TABS
25.0000 mg | ORAL_TABLET | Freq: Every day | ORAL | Status: DC
  Administered 2023-12-05: 25 mg via ORAL
  Filled 2023-12-04: qty 1

## 2023-12-04 MED ORDER — CEFAZOLIN SODIUM-DEXTROSE 2-4 GM/100ML-% IV SOLN
2.0000 g | INTRAVENOUS | Status: AC
  Administered 2023-12-04: 2 g via INTRAVENOUS

## 2023-12-04 MED ORDER — MUPIROCIN 2 % EX OINT
1.0000 | TOPICAL_OINTMENT | Freq: Two times a day (BID) | CUTANEOUS | Status: DC
  Administered 2023-12-05 (×2): 1 via NASAL
  Filled 2023-12-04 (×2): qty 22

## 2023-12-04 MED ORDER — ACETAMINOPHEN 650 MG RE SUPP
650.0000 mg | RECTAL | Status: DC | PRN
Start: 2023-12-04 — End: 2023-12-05

## 2023-12-04 MED ORDER — OXYCODONE HCL 5 MG PO TABS: 5.0000 mg | ORAL_TABLET | Freq: Once | ORAL | Status: DC | PRN

## 2023-12-04 MED ORDER — ROCURONIUM BROMIDE 10 MG/ML (PF) SYRINGE
PREFILLED_SYRINGE | INTRAVENOUS | Status: AC
  Filled 2023-12-04: qty 10

## 2023-12-04 MED ORDER — LACTATED RINGERS IV SOLN: INTRAVENOUS | Status: DC

## 2023-12-04 MED ORDER — OXYCODONE HCL 5 MG PO TABS
5.0000 mg | ORAL_TABLET | ORAL | Status: DC | PRN
Start: 2023-12-04 — End: 2023-12-05
  Administered 2023-12-05: 5 mg via ORAL

## 2023-12-04 MED ORDER — CHLORHEXIDINE GLUCONATE 0.12 % MT SOLN: 15.0000 mL | Freq: Once | OROMUCOSAL | Status: AC

## 2023-12-04 MED ORDER — DEXAMETHASONE SODIUM PHOSPHATE 10 MG/ML IJ SOLN
INTRAMUSCULAR | Status: AC
  Filled 2023-12-04: qty 1

## 2023-12-04 MED ORDER — VASHE WOUND IRRIGATION OPTIME
TOPICAL | Status: DC | PRN
  Administered 2023-12-04: 34 [oz_av] via TOPICAL

## 2023-12-04 MED ORDER — MORPHINE SULFATE (PF) 2 MG/ML IV SOLN: 2.0000 mg | INTRAVENOUS | Status: DC | PRN

## 2023-12-04 MED ORDER — THROMBIN 5000 UNITS EX SOLR: OROMUCOSAL | Status: DC | PRN

## 2023-12-04 MED ORDER — BUPIVACAINE-EPINEPHRINE (PF) 0.5% -1:200000 IJ SOLN
INTRAMUSCULAR | Status: AC
  Filled 2023-12-04: qty 30

## 2023-12-04 MED ORDER — DOUBLE ANTIBIOTIC 500-10000 UNIT/GM EX OINT
TOPICAL_OINTMENT | CUTANEOUS | Status: AC
  Filled 2023-12-04: qty 28.4

## 2023-12-04 MED ORDER — BUPIVACAINE LIPOSOME 1.3 % IJ SUSP
INTRAMUSCULAR | Status: AC
  Filled 2023-12-04: qty 20

## 2023-12-04 MED ORDER — GABAPENTIN 100 MG PO CAPS
200.0000 mg | ORAL_CAPSULE | Freq: Every day | ORAL | Status: DC
  Administered 2023-12-04: 200 mg via ORAL
  Filled 2023-12-04: qty 2

## 2023-12-04 MED ORDER — ROCURONIUM BROMIDE 10 MG/ML (PF) SYRINGE
PREFILLED_SYRINGE | INTRAVENOUS | Status: DC | PRN
  Administered 2023-12-04: 20 mg via INTRAVENOUS
  Administered 2023-12-04: 10 mg via INTRAVENOUS
  Administered 2023-12-04: 20 mg via INTRAVENOUS
  Administered 2023-12-04: 50 mg via INTRAVENOUS

## 2023-12-04 MED ORDER — PHENYLEPHRINE 80 MCG/ML (10ML) SYRINGE FOR IV PUSH (FOR BLOOD PRESSURE SUPPORT)
PREFILLED_SYRINGE | INTRAVENOUS | Status: DC | PRN
  Administered 2023-12-04 (×3): 80 ug via INTRAVENOUS

## 2023-12-04 MED ORDER — 0.9 % SODIUM CHLORIDE (POUR BTL) OPTIME
TOPICAL | Status: DC | PRN
  Administered 2023-12-04: 1000 mL

## 2023-12-04 MED ORDER — ONDANSETRON HCL 4 MG/2ML IJ SOLN
INTRAMUSCULAR | Status: DC | PRN
  Administered 2023-12-04: 4 mg via INTRAVENOUS

## 2023-12-04 MED ORDER — PROPOFOL 10 MG/ML IV BOLUS
INTRAVENOUS | Status: DC | PRN
  Administered 2023-12-04: 140 mg via INTRAVENOUS

## 2023-12-04 MED ORDER — ZOLPIDEM TARTRATE 5 MG PO TABS: 5.0000 mg | ORAL_TABLET | Freq: Every evening | ORAL | Status: DC | PRN

## 2023-12-04 MED ORDER — ONDANSETRON HCL 4 MG/2ML IJ SOLN: 4.0000 mg | Freq: Four times a day (QID) | INTRAMUSCULAR | Status: DC | PRN

## 2023-12-04 MED ORDER — FENTANYL CITRATE (PF) 100 MCG/2ML IJ SOLN
25.0000 ug | INTRAMUSCULAR | Status: DC | PRN
  Administered 2023-12-04 (×4): 25 ug via INTRAVENOUS

## 2023-12-04 MED ORDER — LOSARTAN POTASSIUM 25 MG PO TABS
100.0000 mg | ORAL_TABLET | Freq: Every day | ORAL | Status: DC
Start: 2023-12-05 — End: 2023-12-05
  Administered 2023-12-05: 100 mg via ORAL
  Filled 2023-12-04: qty 4

## 2023-12-04 MED ORDER — ONDANSETRON HCL 4 MG PO TABS: 4.0000 mg | ORAL_TABLET | Freq: Four times a day (QID) | ORAL | Status: DC | PRN

## 2023-12-04 MED ORDER — PHENYLEPHRINE HCL-NACL 20-0.9 MG/250ML-% IV SOLN
INTRAVENOUS | Status: DC | PRN
  Administered 2023-12-04: 25 ug/min via INTRAVENOUS

## 2023-12-04 MED ORDER — LIDOCAINE 2% (20 MG/ML) 5 ML SYRINGE
INTRAMUSCULAR | Status: DC | PRN
  Administered 2023-12-04: 60 mg via INTRAVENOUS

## 2023-12-04 MED ORDER — DOCUSATE SODIUM 100 MG PO CAPS
100.0000 mg | ORAL_CAPSULE | Freq: Two times a day (BID) | ORAL | Status: DC
  Administered 2023-12-04 – 2023-12-05 (×2): 100 mg via ORAL
  Filled 2023-12-04 (×2): qty 1

## 2023-12-04 MED ORDER — SODIUM CHLORIDE 0.9% FLUSH: 3.0000 mL | INTRAVENOUS | Status: DC | PRN

## 2023-12-04 MED ORDER — PHENOL 1.4 % MT LIQD: 1.0000 | OROMUCOSAL | Status: DC | PRN

## 2023-12-04 MED ORDER — BUPIVACAINE-EPINEPHRINE (PF) 0.5% -1:200000 IJ SOLN
INTRAMUSCULAR | Status: DC | PRN
  Administered 2023-12-04: 10 mL

## 2023-12-04 MED ORDER — CEFAZOLIN SODIUM-DEXTROSE 2-4 GM/100ML-% IV SOLN
2.0000 g | Freq: Three times a day (TID) | INTRAVENOUS | Status: AC
  Administered 2023-12-04 – 2023-12-05 (×2): 2 g via INTRAVENOUS
  Filled 2023-12-04 (×2): qty 100

## 2023-12-04 MED ORDER — OXYCODONE HCL 5 MG/5ML PO SOLN: 5.0000 mg | Freq: Once | ORAL | Status: DC | PRN

## 2023-12-04 MED ORDER — PHENYLEPHRINE 80 MCG/ML (10ML) SYRINGE FOR IV PUSH (FOR BLOOD PRESSURE SUPPORT)
PREFILLED_SYRINGE | INTRAVENOUS | Status: AC
  Filled 2023-12-04: qty 10

## 2023-12-04 MED ORDER — LEVOTHYROXINE SODIUM 75 MCG PO TABS
75.0000 ug | ORAL_TABLET | Freq: Every day | ORAL | Status: DC
Start: 2023-12-04 — End: 2023-12-05
  Administered 2023-12-04: 75 ug via ORAL
  Filled 2023-12-04: qty 1

## 2023-12-04 MED ORDER — CEFAZOLIN SODIUM-DEXTROSE 2-4 GM/100ML-% IV SOLN
INTRAVENOUS | Status: AC
  Filled 2023-12-04: qty 100

## 2023-12-04 MED ORDER — ACETAMINOPHEN 325 MG PO TABS: 650.0000 mg | ORAL_TABLET | ORAL | Status: DC | PRN

## 2023-12-04 MED ORDER — OXYCODONE HCL 5 MG PO TABS
10.0000 mg | ORAL_TABLET | ORAL | Status: DC | PRN
  Administered 2023-12-04: 10 mg via ORAL
  Filled 2023-12-04 (×2): qty 2

## 2023-12-04 SURGICAL SUPPLY — 60 items
BAG COUNTER SPONGE SURGICOUNT (BAG) ×1 IMPLANT
BASKET BONE COLLECTION (BASKET) ×1 IMPLANT
BENZOIN TINCTURE PRP APPL 2/3 (GAUZE/BANDAGES/DRESSINGS) ×1 IMPLANT
BLADE CLIPPER SURG (BLADE) IMPLANT
BUR MATCHSTICK NEURO 3.0 LAGG (BURR) ×1 IMPLANT
BUR PRECISION FLUTE 6.0 (BURR) ×1 IMPLANT
CANISTER SUCT 3000ML PPV (MISCELLANEOUS) ×1 IMPLANT
CAP LOCK DLX THRD (Cap) IMPLANT
CLEANSER WND VASHE INSTL 34OZ (WOUND CARE) ×1 IMPLANT
CNTNR URN SCR LID CUP LEK RST (MISCELLANEOUS) ×1 IMPLANT
COVER BACK TABLE 60X90IN (DRAPES) ×1 IMPLANT
DRAPE C-ARM 42X72 X-RAY (DRAPES) ×2 IMPLANT
DRAPE HALF SHEET 40X57 (DRAPES) ×1 IMPLANT
DRAPE LAPAROTOMY 100X72X124 (DRAPES) ×1 IMPLANT
DRAPE SURG 17X23 STRL (DRAPES) ×4 IMPLANT
DRSG OPSITE POSTOP 4X6 (GAUZE/BANDAGES/DRESSINGS) ×1 IMPLANT
ELECT BLADE 4.0 EZ CLEAN MEGAD (MISCELLANEOUS) ×1
ELECT REM PT RETURN 9FT ADLT (ELECTROSURGICAL) ×1
ELECTRODE BLDE 4.0 EZ CLN MEGD (MISCELLANEOUS) ×1 IMPLANT
ELECTRODE REM PT RTRN 9FT ADLT (ELECTROSURGICAL) ×1 IMPLANT
EVACUATOR 1/8 PVC DRAIN (DRAIN) IMPLANT
GAUZE 4X4 16PLY ~~LOC~~+RFID DBL (SPONGE) ×1 IMPLANT
GLOVE BIO SURGEON STRL SZ 6 (GLOVE) ×1 IMPLANT
GLOVE BIO SURGEON STRL SZ8 (GLOVE) ×2 IMPLANT
GLOVE BIO SURGEON STRL SZ8.5 (GLOVE) ×2 IMPLANT
GLOVE BIOGEL PI IND STRL 6.5 (GLOVE) ×1 IMPLANT
GLOVE EXAM NITRILE XL STR (GLOVE) IMPLANT
GOWN STRL REUS W/ TWL LRG LVL3 (GOWN DISPOSABLE) ×1 IMPLANT
GOWN STRL REUS W/ TWL XL LVL3 (GOWN DISPOSABLE) ×2 IMPLANT
GOWN STRL REUS W/TWL 2XL LVL3 (GOWN DISPOSABLE) IMPLANT
HEMOSTAT POWDER KIT SURGIFOAM (HEMOSTASIS) ×1 IMPLANT
KIT BASIN OR (CUSTOM PROCEDURE TRAY) ×1 IMPLANT
KIT GRAFTMAG DEL NEURO DISP (NEUROSURGERY SUPPLIES) IMPLANT
KIT POSITION SURG JACKSON T1 (MISCELLANEOUS) ×1 IMPLANT
KIT TURNOVER KIT B (KITS) ×1 IMPLANT
NDL HYPO 21X1.5 SAFETY (NEEDLE) IMPLANT
NDL HYPO 22X1.5 SAFETY MO (MISCELLANEOUS) ×1 IMPLANT
NEEDLE HYPO 21X1.5 SAFETY (NEEDLE) IMPLANT
NEEDLE HYPO 22X1.5 SAFETY MO (MISCELLANEOUS) ×1 IMPLANT
NS IRRIG 1000ML POUR BTL (IV SOLUTION) ×1 IMPLANT
PACK LAMINECTOMY NEURO (CUSTOM PROCEDURE TRAY) ×1 IMPLANT
PAD ARMBOARD 7.5X6 YLW CONV (MISCELLANEOUS) ×3 IMPLANT
PATTIES SURGICAL .5 X1 (DISPOSABLE) IMPLANT
PUTTY DBM 10CC CALC GRAN (Putty) IMPLANT
PUTTY DBM 5CC CALC GRAN (Putty) IMPLANT
ROD CURVED TI 6.35X50 (Rod) IMPLANT
SCREW PA DLX CREO 7.5X55 (Screw) IMPLANT
SPACER ALTERA 10X31-15 (Spacer) IMPLANT
SPIKE FLUID TRANSFER (MISCELLANEOUS) ×1 IMPLANT
SPONGE NEURO XRAY DETECT 1X3 (DISPOSABLE) IMPLANT
SPONGE SURGIFOAM ABS GEL 100 (HEMOSTASIS) IMPLANT
SPONGE T-LAP 4X18 ~~LOC~~+RFID (SPONGE) IMPLANT
STRIP CLOSURE SKIN 1/2X4 (GAUZE/BANDAGES/DRESSINGS) ×1 IMPLANT
SUT VIC AB 1 CT1 18XBRD ANBCTR (SUTURE) ×2 IMPLANT
SUT VIC AB 2-0 CP2 18 (SUTURE) ×2 IMPLANT
SYR 20ML LL LF (SYRINGE) IMPLANT
TOWEL GREEN STERILE (TOWEL DISPOSABLE) ×1 IMPLANT
TOWEL GREEN STERILE FF (TOWEL DISPOSABLE) ×1 IMPLANT
TRAY FOLEY MTR SLVR 16FR STAT (SET/KITS/TRAYS/PACK) ×1 IMPLANT
WATER STERILE IRR 1000ML POUR (IV SOLUTION) ×1 IMPLANT

## 2023-12-04 NOTE — Progress Notes (Signed)
Orthopedic Tech Progress Note Patient Details:  BETTYANN PLILER Dec 10, 1941 [id number]  Applied and adjusted LSO to patient. Ortho Devices Type of Ortho Device: Lumbar corsett Ortho Device/Splint Interventions: Ordered, Application, Adjustment   Post Interventions Patient Tolerated: Well Instructions Provided: Adjustment of device, Care of device  Diannia Ruder 12/04/2023, 4:17 PM

## 2023-12-04 NOTE — Op Note (Signed)
Brief history: The patient is an 82 year old white female who is complaining of back and right greater left leg pain consistent with lumbosacral radiculopathy/neurogenic claudication.  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  I discussed the various treatment options with her.  She has decided proceed with surgery.  Preoperative diagnosis: L4-5 spondylolisthesis, facet arthropathy,  spinal stenosis compressing L2, L3, L4 and L5 nerve roots; lumbago; lumbar radiculopathy; neurogenic claudication  Postoperative diagnosis: The same  Procedure: Bilateral L2-3 and L4-5 laminotomy/foraminotomies/medial facetectomy to decompress the bilateral L2, L3, L4 and L5 nerve roots(the work required to do this was in addition to the work required to do the posterior lumbar interbody fusion because of the patient's spinal stenosis, facet arthropathy. Etc. requiring a wide decompression of the nerve roots.); right L4-5 transforaminal lumbar interbody fusion with local morselized autograft bone and Zimmer DBM; insertion of interbody prosthesis at L4-5 (globus peek expandable interbody prosthesis); posterior nonsegmental instrumentation from L4 to L5 with globus titanium pedicle screws and rods; posterior lateral arthrodesis at L4-5 with local morselized autograft bone and Zimmer DBM.  Surgeon: Dr. Delma Officer  Asst.: Dr. Lisbeth Renshaw and Hildred Priest, NP  Anesthesia: Gen. endotracheal  Estimated blood loss: 250 cc  Drains: None  Complications: None  Description of procedure: The patient was brought to the operating room by the anesthesia team. General endotracheal anesthesia was induced. The patient was turned to the prone position on the Wilson frame. The patient's lumbosacral region was then prepared with Betadine scrub and Betadine solution. Sterile drapes were applied.  I  then injected the area to be incised with Marcaine with epinephrine solution. I then used the scalpel to make a linear midline incision over the L2-3, L3-4 and L4-5 interspace. I then used electrocautery to perform a bilateral subperiosteal dissection exposing the spinous process and lamina of L2-3, L3-4 and L4-5. We then obtained intraoperative radiograph to confirm our location. We then inserted the Verstrac retractor to provide exposure.  I began the decompression by using the high speed drill to perform laminotomies at L2-3 on the left and L4-5 bilaterally. We then used the Kerrison punches to widen the laminotomy and removed the ligamentum flavum at L2-3 on the left and L4-5 bilaterally. We used the Kerrison punches to remove the medial facets at L2-3 and L4-5 bilaterally.  I then drilled across the midline at L2-3 and used the Kerrison punch to remove the right L2-3 ligamentum flavum and performed foraminotomies about the right L2-3 and L3-4 nerve roots.  We performed wide foraminotomies about the bilateral L4 and L5 nerve roots completing the decompression.  We now turned our attention to the posterior lumbar interbody fusion. I used a scalpel to incise the intervertebral disc at L4-5 bilaterally. I then performed a partial intervertebral discectomy at L4-5 bilaterally using the pituitary forceps. We prepared the vertebral endplates at L4-5 bilaterally for the fusion by removing the soft tissues with the curettes. We then used the trial spacers to pick the appropriate sized interbody prosthesis. We prefilled his prosthesis with a combination of local morselized autograft bone that we obtained during the decompression as well as Zimmer DBM. We inserted the prefilled prosthesis into the interspace at L4-5 from the right, we then turned and expanded the prosthesis. There was a good snug fit of the prosthesis in the interspace. We then filled and the remainder of the intervertebral disc space  with local  morselized autograft bone and Zimmer DBM. This completed the posterior lumbar interbody arthrodesis.  During the decompression and insertion of the prosthesis the assistant protected the thecal sac and nerve roots with the D'Errico retractor.  We now turned attention to the instrumentation. Under fluoroscopic guidance we cannulated the bilateral L4 and L5 pedicles with the bone probe. We then removed the bone probe. We then tapped the pedicle with a 6.5 millimeter tap. We then removed the tap. We probed inside the tapped pedicle with a ball probe to rule out cortical breaches. We then inserted a 7.5 x 55 millimeter pedicle screw into the L4 and L5 pedicles bilaterally under fluoroscopic guidance. We then palpated along the medial aspect of the pedicles to rule out cortical breaches. There were none. The nerve roots were not injured. We then connected the unilateral pedicle screws with a lordotic rod. We compressed the construct and secured the rod in place with the caps. We then tightened the caps appropriately. This completed the instrumentation from L4-5.  We now turned our attention to the posterior lateral arthrodesis at L4-5. We used the high-speed drill to decorticate the remainder of the facets, pars, transverse process at L4-5. We then applied a combination of local morselized autograft bone and Zimmer DBM over these decorticated posterior lateral structures. This completed the posterior lateral arthrodesis.  We then obtained hemostasis using bipolar electrocautery. We irrigated the wound out with vashe solution. We inspected the thecal sac and nerve roots and noted they were well decompressed. We then removed the retractor.  We injected Exparel . We reapproximated patient's thoracolumbar fascia with interrupted #1 Vicryl suture. We reapproximated patient's subcutaneous tissue with interrupted 2-0 Vicryl suture. The reapproximated patient's skin with Steri-Strips and benzoin. The wound was then coated  with bacitracin ointment. A sterile dressing was applied. The drapes were removed. The patient was subsequently returned to the supine position where they were extubated by the anesthesia team. He was then transported to the post anesthesia care unit in stable condition. All sponge instrument and needle counts were reportedly correct at the end of this case.

## 2023-12-04 NOTE — Anesthesia Procedure Notes (Signed)
Procedure Name: Intubation Date/Time: 12/04/2023 9:57 AM  Performed by: Garfield Cornea, CRNAPre-anesthesia Checklist: Patient identified, Emergency Drugs available, Suction available and Patient being monitored Patient Re-evaluated:Patient Re-evaluated prior to induction Oxygen Delivery Method: Circle System Utilized Preoxygenation: Pre-oxygenation with 100% oxygen Induction Type: IV induction Ventilation: Mask ventilation without difficulty Laryngoscope Size: Mac and 3 Grade View: Grade I Tube type: Oral Tube size: 7.0 mm Number of attempts: 1 Airway Equipment and Method: Stylet Placement Confirmation: ETT inserted through vocal cords under direct vision, positive ETCO2 and breath sounds checked- equal and bilateral Secured at: 22 cm Tube secured with: Tape Dental Injury: Teeth and Oropharynx as per pre-operative assessment

## 2023-12-04 NOTE — Anesthesia Preprocedure Evaluation (Signed)
Anesthesia Evaluation  Patient identified by MRN, date of birth, ID band Patient awake    Reviewed: Allergy & Precautions, H&P , NPO status , Patient's Chart, lab work & pertinent test results  Airway Mallampati: II   Neck ROM: full    Dental   Pulmonary neg pulmonary ROS   breath sounds clear to auscultation       Cardiovascular hypertension,  Rhythm:regular Rate:Normal     Neuro/Psych  Neuromuscular disease    GI/Hepatic ,GERD  ,,  Endo/Other  Hypothyroidism    Renal/GU stones     Musculoskeletal  (+) Arthritis ,    Abdominal   Peds  Hematology   Anesthesia Other Findings   Reproductive/Obstetrics                             Anesthesia Physical Anesthesia Plan  ASA: 2  Anesthesia Plan: General   Post-op Pain Management:    Induction: Intravenous  PONV Risk Score and Plan: 3 and Ondansetron, Dexamethasone and Treatment may vary due to age or medical condition  Airway Management Planned: Oral ETT  Additional Equipment:   Intra-op Plan:   Post-operative Plan: Extubation in OR  Informed Consent: I have reviewed the patients History and Physical, chart, labs and discussed the procedure including the risks, benefits and alternatives for the proposed anesthesia with the patient or authorized representative who has indicated his/her understanding and acceptance.     Dental advisory given  Plan Discussed with: CRNA, Anesthesiologist and Surgeon  Anesthesia Plan Comments:        Anesthesia Quick Evaluation

## 2023-12-04 NOTE — Transfer of Care (Signed)
Immediate Anesthesia Transfer of Care Note  Patient: Allison Whitaker  Procedure(s) Performed: LUMBAR TWO-THREE LAMINOTOMY/FORAMINOTOMY WITH LUMBAR FOUR-FIVE POSTERIOR LUMBAR INTERBODY FUSION (Spine Lumbar)  Patient Location: PACU  Anesthesia Type:General  Level of Consciousness: awake, alert , and oriented  Airway & Oxygen Therapy: Patient Spontanous Breathing and Patient connected to face mask oxygen  Post-op Assessment: Report given to RN and Post -op Vital signs reviewed and stable  Post vital signs: Reviewed and stable  Last Vitals:  Vitals Value Taken Time  BP 120/66 12/04/23 1430  Temp 36.7 C 12/04/23 1421  Pulse 88 12/04/23 1431  Resp 15 12/04/23 1431  SpO2 95 % 12/04/23 1431  Vitals shown include unfiled device data.  Last Pain:  Vitals:   12/04/23 0833  TempSrc:   PainSc: 4          Complications: No notable events documented.

## 2023-12-04 NOTE — H&P (Signed)
Subjective: The patient is an 82 year old white female who is complaining of back and bilateral leg pain, right greater than left consistent with lumbar radiculopathy/neurogenic claudication.  She has failed medical management and was worked up with a lumbar MRI which demonstrated a lumbar spondylolisthesis with spinal stenosis.  I discussed the various treatment options with her.  She has decided proceed with surgery.  Past Medical History:  Diagnosis Date   Anemia    DDD (degenerative disc disease), lumbosacral    GERD (gastroesophageal reflux disease)    Hiatal hernia    History of adenomatous polyp of colon    History of hypercalcemia    History of kidney stones    Hyperlipidemia    Hypertension    Hypothyroidism    followed by pcp   OA (osteoarthritis)    RA (rheumatoid arthritis) [facility])    rheumatologist--- dr Dierdre Forth; per pt dx 1990s  multiple sites,  treated w/ methotrexate   Right ureteral calculus    SUI (stress urinary incontinence, female)    Thyroid nodule    single left nodule ;   bx 11-26-2012  benign   Wears contact lenses     Past Surgical History:  Procedure Laterality Date   COLONOSCOPY WITH PROPOFOL  02/03/2017   dr stark   CYSTOSCOPY W/ RETROGRADES  06/24/2023   Procedure: CYSTOSCOPY WITH RETROGRADE PYELOGRAM;  Surgeon: Despina Arias, MD;  Location: [facility] [place];  Service: Urology;;   CYSTOSCOPY W/ URETERAL STENT PLACEMENT Right 06/06/2023   Procedure: CYSTOSCOPY WITH RIGHT RETROGRADE PYELOGRAM/RIGHT URETERAL STENT PLACEMENT;  Surgeon: Despina Arias, MD;  Location: [facility];  Service: Urology;  Laterality: Right;   CYSTOSCOPY/URETEROSCOPY/HOLMIUM LASER/STENT PLACEMENT Right 06/24/2023   Procedure: RIGHT URETEROSCOPY/HOLMIUM LASER/STENT EXCHANGE;  Surgeon: Despina Arias, MD;  Location: [facility];  Service: Urology;  Laterality: Right;  60 MINUTES NEEDED FOR CASE   DORSAL COMPARTMENT RELEASE Right 10/06/2008   @ [facility] by dr Merlyn Lot;   released first dorsal compartement w/ tenosynvectomy   OOPHORECTOMY  1983   per pt unilateral ,  unsure which side for dermoid cyst   TOTAL HIP ARTHROPLASTY Right 05/13/2022   Procedure: RIGHT TOTAL HIP ARTHROPLASTY ANTERIOR APPROACH;  Surgeon: Tarry Kos, MD;  Location: [facility];  Service: Orthopedics;  Laterality: Right;  3-C   WISDOM TOOTH EXTRACTION     age 84s    Allergies  Allergen Reactions   Sulfa Antibiotics Itching and Rash    Social History   Tobacco Use   Smoking status: Never   Smokeless tobacco: Never  Substance Use Topics   Alcohol use: Yes    Comment: occasional    Family History  Problem Relation Age of Onset   Stroke Mother    Colon cancer Neg Hx    Prior to Admission medications   Medication Sig Start Date End Date Taking? Authorizing Provider  Cholecalciferol (VITAMIN D) 50 MCG (2000 UT) tablet Take 2,000 Units by mouth daily.   Yes [provider]  diclofenac Sodium (VOLTAREN) 1 % GEL Apply 2 g topically 4 (four) times daily as needed (pain).   Yes [provider]  folic acid (FOLVITE) 1 MG tablet Take 3 mg by mouth daily.   Yes [provider]  gabapentin (NEURONTIN) 100 MG capsule Take 200 mg by mouth at bedtime. 04/17/22  Yes [provider]  levothyroxine (SYNTHROID, LEVOTHROID) 75 MCG tablet Take 75 mcg by mouth at bedtime.   Yes [provider]  losartan-hydrochlorothiazide (  HYZAAR) 100-25 MG tablet Take 1 tablet by mouth daily. 02/26/21  Yes [provider]  meloxicam (MOBIC) 7.5 MG tablet Take 7.5 mg by mouth daily as needed for pain.   Yes [provider]  methotrexate (RHEUMATREX) 2.5 MG tablet Take 15 mg by mouth every Wednesday. Caution:Chemotherapy. Protect from light.   Yes [provider]  Multiple Vitamin (MULTIVITAMIN) tablet Take 1 tablet by mouth daily.   Yes [provider]  omeprazole (PRILOSEC) 20 MG capsule Take 20 mg by mouth daily.   Yes [provider]  Polyvinyl Alcohol-Povidone (REFRESH OP) Place 1 drop into both eyes daily.   Yes [provider]  predniSONE (STERAPRED UNI-PAK 21 TAB) 10 MG (21) TBPK tablet Take as directed Patient not taking: Reported on 11/25/2023 09/01/23   Tarry Kos, MD  Vitamin A 2400 MCG (8000 UT) CAPS Take 8,000 Units by mouth daily.   Yes [provider]  amoxicillin (AMOXIL) 500 MG tablet Take 4 tablets by mouth 1 hour prior to dental procedure. 12/31/22   Tarry Kos, MD  ondansetron (ZOFRAN) 4 MG tablet Take 1 tablet (4 mg total) by mouth every 8 (eight) hours as needed for nausea or vomiting. 02/19/23   Ellsworth Lennox, PA-C  oxyCODONE (ROXICODONE) 5 MG immediate release tablet Take 1 tablet (5 mg total) by mouth every 6 (six) hours as needed for severe pain. Patient not taking: Reported on 11/25/2023 06/24/23   Despina Arias, MD  traMADol (ULTRAM) 50 MG tablet Take 1 tablet (50 mg total) by mouth every 8 (eight) hours as needed for moderate pain or severe pain. Patient not taking: Reported on 11/25/2023 07/17/23   Juanda Chance, NP     Review of Systems  Positive ROS: As above  All other systems have been reviewed and were otherwise negative with the exception of those mentioned in the HPI and as above.  Objective: Vital signs in last 24 hours: Temp:  [98.2 F (36.8 C)] 98.2 F (36.8 C) (12/12 0808) Pulse Rate:  [73] 73 (12/12 0808) Resp:  [17] 17 (12/12 0808) BP: (148)/(83) 148/83 (12/12 0808) SpO2:  [98 %] 98 % (12/12 0808) Weight:  [81.6 kg] 81.6 kg (12/12 0808) Estimated body mass index is 29.95 kg/m as calculated from the following:   Height as of this encounter: 5\' 5"  (1.651 m).   Weight as of this encounter: 81.6 kg.   General Appearance: Alert Head: Normocephalic, without obvious abnormality, atraumatic Eyes: PERRL, conjunctiva/corneas clear, EOM's intact,    Ears: Normal  Throat: Normal  Neck: Supple, Back: unremarkable Lungs: Clear to auscultation  bilaterally, respirations unlabored Heart: Regular rate and rhythm, no murmur, rub or gallop Abdomen: Soft, non-tender Extremities: Extremities normal, atraumatic, no cyanosis or edema Skin: unremarkable  NEUROLOGIC:   Mental status: alert and oriented,Motor Exam - grossly normal Sensory Exam - grossly normal Reflexes:  Coordination - grossly normal Gait - grossly normal Balance - grossly normal Cranial Nerves: I: smell Not tested  II: visual acuity  OS: Normal  OD: Normal   II: visual fields Full to confrontation  II: pupils Equal, round, reactive to light  III,VII: ptosis None  III,IV,VI: extraocular muscles  Full ROM  V: mastication Normal  V: facial light touch sensation  Normal  V,VII: corneal reflex  Present  VII: facial muscle function - upper  Normal  VII: facial muscle function - lower Normal  VIII: hearing Not tested  IX: soft palate elevation  Normal  IX,X: gag  reflex Present  XI: trapezius strength  5/5  XI: sternocleidomastoid strength 5/5  XI: neck flexion strength  5/5  XII: tongue strength  Normal    Data Review Lab Results  Component Value Date   WBC 6.4 12/01/2023   HGB 12.8 12/01/2023   HCT 39.4 12/01/2023   MCV 96.3 12/01/2023   PLT 283 12/01/2023   Lab Results  Component Value Date   NA 140 12/01/2023   K 3.5 12/01/2023   CL 109 12/01/2023   CO2 24 12/01/2023   BUN 18 12/01/2023   CREATININE 1.05 (H) 12/01/2023   GLUCOSE 104 (H) 12/01/2023   No results found for: "INR", "PROTIME"  Assessment/Plan: Lumbar spinal listhesis, lumbar spinal stenosis, lumbar radiculopathy, neurogenic claudication, lumbago: I discussed the situation with this patient.  I have reviewed her imaging studies with her and pointed out the abnormalities.  We have discussed the various treatment options including surgery.  I have described the surgical treatment option of L2-3 and L4-5 laminotomy/foraminotomies with instrumentation and fusion at L4-5.  I have shown her  surgical models.  I have given her a surgical pamphlet.  We have discussed the risk, benefits, alternatives, expected postoperative course, and likelihood of achieving our goals with surgery.  I have answered all the patient's questions.  She has decided proceed with surgery.   Cristi Loron 12/04/2023 9:35 AM

## 2023-12-05 DIAGNOSIS — M4316 Spondylolisthesis, lumbar region: Secondary | ICD-10-CM | POA: Diagnosis not present

## 2023-12-05 MED ORDER — OXYCODONE-ACETAMINOPHEN 5-325 MG PO TABS
1.0000 | ORAL_TABLET | ORAL | Status: DC | PRN
  Administered 2023-12-05: 1 via ORAL
  Filled 2023-12-05 (×2): qty 1

## 2023-12-05 MED ORDER — CYCLOBENZAPRINE HCL 5 MG PO TABS: 5.0000 mg | ORAL_TABLET | Freq: Three times a day (TID) | ORAL | Status: DC | PRN

## 2023-12-05 MED ORDER — OXYCODONE-ACETAMINOPHEN 5-325 MG PO TABS: 1.0000 | ORAL_TABLET | ORAL | 0 refills | Status: AC | PRN

## 2023-12-05 MED ORDER — DOCUSATE SODIUM 100 MG PO CAPS: 100.0000 mg | ORAL_CAPSULE | Freq: Two times a day (BID) | ORAL | 0 refills | Status: AC

## 2023-12-05 MED ORDER — CYCLOBENZAPRINE HCL 5 MG PO TABS: 5.0000 mg | ORAL_TABLET | Freq: Three times a day (TID) | ORAL | 0 refills | Status: AC | PRN

## 2023-12-05 NOTE — Anesthesia Postprocedure Evaluation (Signed)
Anesthesia Post Note  Patient: Allison Whitaker  Procedure(s) Performed: LUMBAR TWO-THREE LAMINOTOMY/FORAMINOTOMY WITH LUMBAR FOUR-FIVE POSTERIOR LUMBAR INTERBODY FUSION (Spine Lumbar)     Patient location during evaluation: PACU Anesthesia Type: General Level of consciousness: awake and alert Pain management: pain level controlled Vital Signs Assessment: post-procedure vital signs reviewed and stable Respiratory status: spontaneous breathing, nonlabored ventilation, respiratory function stable and patient connected to nasal cannula oxygen Cardiovascular status: blood pressure returned to baseline and stable Postop Assessment: no apparent nausea or vomiting Anesthetic complications: no   No notable events documented.  Last Vitals:  Vitals:   12/05/23 0529 12/05/23 0750  BP: 134/84 (!) 124/55  Pulse: 74 65  Resp: 20 16  Temp: 36.6 C 36.8 C  SpO2: 100% 100%    Last Pain:  Vitals:   12/05/23 0944  TempSrc:   PainSc: 6                  Lynnann Knudsen S

## 2023-12-05 NOTE — Progress Notes (Signed)
Patient alert and oriented, void, ambulate. Surgical site clean and dry no sign of infection. D/c instruction explain and given to the patient all questions answered to the patient satisfaction.

## 2023-12-05 NOTE — Discharge Summary (Signed)
Physician Discharge Summary  Patient ID: Allison Whitaker MRN: [id number] DOB/AGE: 06/28/1941 82 y.o.  Admit date: 12/04/2023 Discharge date: 12/05/2023  Admission Diagnoses: Lumbar spine listhesis, lumbar spinal stenosis, lumbar radiculopathy, neurogenic claudication  Discharge Diagnoses: The same Principal Problem:   Spondylolisthesis of lumbar region   Discharged Condition: good  Hospital Course: I performed an L2-3 and L4-5 decompression with instrumentation and fusion L4-5 on the patient on 12/04/2023.  The surgery went well.  The patient's postoperative course was unremarkable.  On postoperative day #1 she felt well and requested discharge to home.  She was given verbal and written discharge instructions.  All her questions were answered.  Consults: PT, care management Significant Diagnostic Studies: None Treatments: L2-3 and L4-5 decompression with instrumentation and fusion L4-5. Discharge Exam: Blood pressure (!) 124/55, pulse 65, temperature 98.2 F (36.8 C), temperature source Oral, resp. rate 16, height 5\' 5"  (1.651 m), weight 81.6 kg, SpO2 100%. The patient is alert and pleasant.  Her strength is normal.  She looks well.  Disposition: Home  Discharge Instructions     Call MD for:  difficulty breathing, headache or visual disturbances   Complete by: As directed    Call MD for:  extreme fatigue   Complete by: As directed    Call MD for:  hives   Complete by: As directed    Call MD for:  persistant dizziness or light-headedness   Complete by: As directed    Call MD for:  persistant nausea and vomiting   Complete by: As directed    Call MD for:  redness, tenderness, or signs of infection (pain, swelling, redness, odor or green/yellow discharge around incision site)   Complete by: As directed    Call MD for:  severe uncontrolled pain   Complete by: As directed    Call MD for:  temperature >100.4   Complete by: As directed    Diet - low sodium heart healthy    Complete by: As directed    Discharge instructions   Complete by: As directed    Call [phone] for a followup appointment. Take a stool softener while you are using pain medications.   Driving Restrictions   Complete by: As directed    Do not drive for 2 weeks.   Increase activity slowly   Complete by: As directed    Lifting restrictions   Complete by: As directed    Do not lift more than 5 pounds. No excessive bending or twisting.   May shower / Bathe   Complete by: As directed    Remove the dressing for 3 days after surgery.  You may shower, but leave the incision alone.   Remove dressing in 48 hours   Complete by: As directed       Allergies as of 12/05/2023       Reactions   Sulfa Antibiotics Itching, Rash        Medication List     STOP taking these medications    amoxicillin 500 MG tablet Commonly known as: AMOXIL   meloxicam 7.5 MG tablet Commonly known as: MOBIC   oxyCODONE 5 MG immediate release tablet Commonly known as: Roxicodone   predniSONE 10 MG (21) Tbpk tablet Commonly known as: STERAPRED UNI-PAK 21 TAB   traMADol 50 MG tablet Commonly known as: ULTRAM       TAKE these medications    cyclobenzaprine 5 MG tablet Commonly known as: FLEXERIL Take 1 tablet (5 mg total) by mouth 3 (three)  times daily as needed for muscle spasms.   diclofenac Sodium 1 % Gel Commonly known as: VOLTAREN Apply 2 g topically 4 (four) times daily as needed (pain).   docusate sodium 100 MG capsule Commonly known as: COLACE Take 1 capsule (100 mg total) by mouth 2 (two) times daily.   folic acid 1 MG tablet Commonly known as: FOLVITE Take 3 mg by mouth daily.   gabapentin 100 MG capsule Commonly known as: NEURONTIN Take 200 mg by mouth at bedtime.   levothyroxine 75 MCG tablet Commonly known as: SYNTHROID Take 75 mcg by mouth at bedtime.   losartan-hydrochlorothiazide 100-25 MG tablet Commonly known as: HYZAAR Take 1 tablet by mouth daily.    methotrexate 2.5 MG tablet Commonly known as: RHEUMATREX Take 15 mg by mouth every Wednesday. Caution:Chemotherapy. Protect from light.   multivitamin tablet Take 1 tablet by mouth daily.   omeprazole 20 MG capsule Commonly known as: PRILOSEC Take 20 mg by mouth daily.   ondansetron 4 MG tablet Commonly known as: Zofran Take 1 tablet (4 mg total) by mouth every 8 (eight) hours as needed for nausea or vomiting.   oxyCODONE-acetaminophen 5-325 MG tablet Commonly known as: PERCOCET/ROXICET Take 1-2 tablets by mouth every 4 (four) hours as needed for moderate pain (pain score 4-6).   REFRESH OP Place 1 drop into both eyes daily.   Vitamin A 2400 MCG (8000 UT) Caps Take 8,000 Units by mouth daily.   Vitamin D 50 MCG (2000 UT) tablet Take 2,000 Units by mouth daily.         Signed: Cristi Loron 12/05/2023, 8:04 AM

## 2023-12-05 NOTE — Evaluation (Signed)
Physical Therapy Evaluation  Patient Details Name: Allison Whitaker MRN: [id number] DOB: 1941/08/03 Today's Date: 12/05/2023  History of Present Illness  Pt is an 82 y/o female who presents s/p L4-L5 TLIF on 12/04/2023. PMH significant for DDD, hypercalcemia, HTN, hypothyroidism, OA, RA, R THA 2023.   Clinical Impression  Pt admitted with above diagnosis. At the time of PT eval, pt was able to demonstrate transfers and ambulation with gross CGA to supervision for safety and RW for support. Pt was educated on precautions, brace application/wearing schedule, appropriate activity progression, and car transfer. Pt currently with functional limitations due to the deficits listed below (see PT Problem List). Pt will benefit from skilled PT to increase their independence and safety with mobility to allow discharge to the venue listed below.          If plan is discharge home, recommend the following: A little help with walking and/or transfers;A little help with bathing/dressing/bathroom;Assistance with cooking/housework;Assist for transportation;Help with stairs or ramp for entrance   Can travel by private vehicle        Equipment Recommendations None recommended by PT  Recommendations for Other Services       Functional Status Assessment Patient has had a recent decline in their functional status and demonstrates the ability to make significant improvements in function in a reasonable and predictable amount of time.     Precautions / Restrictions Precautions Precautions: Fall;Back Precaution Booklet Issued: Yes (comment) Precaution Comments: Reviewed handout and pt was cued for precautions during functional mobility and ADL's. Required Braces or Orthoses: Spinal Brace Spinal Brace: Lumbar corset;Applied in sitting position Restrictions Weight Bearing Restrictions Per Provider Order: No      Mobility  Bed Mobility Overal bed mobility: Needs Assistance Bed Mobility: Rolling,  Sidelying to Sit Rolling: Supervision Sidelying to sit: Supervision       General bed mobility comments: HOB flat and rails lowered to simulate home environment. No assist required for transition to EOB with log roll technique.    Transfers Overall transfer level: Needs assistance Equipment used: Rolling walker (2 wheels) Transfers: Sit to/from Stand Sit to Stand: Supervision           General transfer comment: VC's for hand placement on seated surface for safety. No assist required for power up to full stand.    Ambulation/Gait Ambulation/Gait assistance: Contact guard assist Gait Distance (Feet): 300 Feet Assistive device: Rolling walker (2 wheels) Gait Pattern/deviations: Step-through pattern, Decreased stride length, Trunk flexed Gait velocity: Decreased Gait velocity interpretation: <1.8 ft/sec, indicate of risk for recurrent falls   General Gait Details: VC's for improved posture, closer walker proximity and forward gaze. No assist required. No unstediness or LOB noted.  Stairs Stairs: Yes Stairs assistance: Contact guard assist Stair Management: No rails, Step to pattern, Forwards Number of Stairs: 2 (x2 - Pt initially using rail and then progressed to no rail to simulate home environment.) General stair comments: VC's for sequencing and general safety.  Wheelchair Mobility     Tilt Bed    Modified Rankin (Stroke Patients Only)       Balance Overall balance assessment: Needs assistance Sitting-balance support: Feet supported, No upper extremity supported Sitting balance-Leahy Scale: Fair     Standing balance support: No upper extremity supported, During functional activity Standing balance-Leahy Scale: Fair Standing balance comment: Statically. When initiating dynamic movement, needs UE support.  Pertinent Vitals/Pain Pain Assessment Pain Assessment: Faces Faces Pain Scale: Hurts little more Pain Location:  incision site Pain Descriptors / Indicators: Operative site guarding Pain Intervention(s): Limited activity within patient's tolerance, Monitored during session, Repositioned    Home Living Family/patient expects to be discharged to:: Private residence Living Arrangements: Spouse/significant other Available Help at Discharge: Family;Available 24 hours/day Type of Home: House Home Access: Stairs to enter Entrance Stairs-Rails: None Entrance Stairs-Number of Steps: 2 Alternate Level Stairs-Number of Steps: flight Home Layout: Two level;Able to live on main level with bedroom/bathroom Home Equipment: Cane - single Librarian, academic (2 wheels);BSC/3in1;Shower seat      Prior Function Prior Level of Function : Independent/Modified Independent                     Extremity/Trunk Assessment        Lower Extremity Assessment Lower Extremity Assessment: Generalized weakness (mild; consistent with pre-op diagnosis)    Cervical / Trunk Assessment Cervical / Trunk Assessment: Back Surgery  Communication   Communication Communication: No apparent difficulties Cueing Techniques: Verbal cues;Gestural cues  Cognition Arousal: Alert Behavior During Therapy: WFL for tasks assessed/performed Overall Cognitive Status: Within Functional Limits for tasks assessed                                          General Comments      Exercises     Assessment/Plan    PT Assessment Patient needs continued PT services  PT Problem List Decreased strength;Decreased activity tolerance;Decreased balance;Decreased mobility;Decreased knowledge of use of DME;Decreased safety awareness;Decreased knowledge of precautions;Pain       PT Treatment Interventions DME instruction;Gait training;Stair training;Functional mobility training;Therapeutic activities;Therapeutic exercise;Balance training;Patient/family education    PT Goals (Current goals can be found in the Care Plan  section)  Acute Rehab PT Goals Patient Stated Goal: Return home today. PT Goal Formulation: With patient/family Time For Goal Achievement: 12/12/23 Potential to Achieve Goals: Good    Frequency Min 5X/week     Co-evaluation               AM-PAC PT "6 Clicks" Mobility  Outcome Measure Help needed turning from your back to your side while in a flat bed without using bedrails?: A Little Help needed moving from lying on your back to sitting on the side of a flat bed without using bedrails?: A Little Help needed moving to and from a bed to a chair (including a wheelchair)?: A Little Help needed standing up from a chair using your arms (e.g., wheelchair or bedside chair)?: A Little Help needed to walk in hospital room?: A Little Help needed climbing 3-5 steps with a railing? : A Little 6 Click Score: 18    End of Session Equipment Utilized During Treatment: Gait belt Activity Tolerance: Patient tolerated treatment well Patient left: in bed;with call bell/phone within reach;with family/visitor present Nurse Communication: Mobility status PT Visit Diagnosis: Unsteadiness on feet (R26.81);Pain Pain - part of body:  (back)    Time: 1610-9604 PT Time Calculation (min) (ACUTE ONLY): 33 min   Charges:   PT Evaluation $PT Eval Low Complexity: 1 Low PT Treatments $Gait Training: 8-22 mins PT General Charges $$ ACUTE PT VISIT: 1 Visit         Conni Slipper, PT, DPT Acute Rehabilitation Services Secure Chat Preferred Office: [phone]   Marylynn Pearson 12/05/2023, 12:56 PM

## 2023-12-11 MED FILL — Heparin Sodium (Porcine) Inj 1000 Unit/ML: INTRAMUSCULAR | Qty: 30 | Status: AC

## 2023-12-11 MED FILL — Sodium Chloride IV Soln 0.9%: INTRAVENOUS | Qty: 2000 | Status: AC

## 2024-01-07 ENCOUNTER — Encounter: Payer: Self-pay | Admitting: *Deleted

## 2024-01-07 NOTE — Congregational Nurse Program (Signed)
 Patient is status post lumber laminectomy.  Having pain at the site of the IT band.  Pain is from the lower back and wraps around the front of the right leg.  Has no has physical therapy except for home instructions.  No numbness or tingling in the buttock or down the leg.  Suggested that she make the md who did the surgery aware of this so that .ithey feel like p.t. may help it can be orderedpatinet agreed has a upcoming f/u appt. This week.

## 2024-01-12 ENCOUNTER — Ambulatory Visit: Payer: Medicare Other | Admitting: Physical Therapy

## 2024-01-12 ENCOUNTER — Encounter: Payer: Self-pay | Admitting: Physical Therapy

## 2024-01-12 DIAGNOSIS — M5416 Radiculopathy, lumbar region: Secondary | ICD-10-CM

## 2024-01-12 DIAGNOSIS — M5459 Other low back pain: Secondary | ICD-10-CM

## 2024-01-12 DIAGNOSIS — M6281 Muscle weakness (generalized): Secondary | ICD-10-CM

## 2024-01-12 DIAGNOSIS — R29898 Other symptoms and signs involving the musculoskeletal system: Secondary | ICD-10-CM | POA: Diagnosis not present

## 2024-01-12 NOTE — Therapy (Signed)
OUTPATIENT PHYSICAL THERAPY EVALUATION   Patient Name: Allison Whitaker MRN: [id number] DOB:09-29-1941, 83 y.o., female Today's Date: 01/12/2024  END OF SESSION:  PT End of Session - 01/12/24 1418     Visit Number 1    Number of Visits 12    Date for PT Re-Evaluation 02/23/24    Authorization Type [employer] Medicare    Progress Note Due on Visit 10    PT Start Time 1344    PT Stop Time 1419    PT Time Calculation (min) 35 min    Activity Tolerance Patient tolerated treatment well    Behavior During Therapy WFL for tasks assessed/performed             Past Medical History:  Diagnosis Date   Anemia    DDD (degenerative disc disease), lumbosacral    GERD (gastroesophageal reflux disease)    Hiatal hernia    History of adenomatous polyp of colon    History of hypercalcemia    History of kidney stones    Hyperlipidemia    Hypertension    Hypothyroidism    followed by pcp   OA (osteoarthritis)    RA (rheumatoid arthritis) [facility])    rheumatologist--- dr Dierdre Forth; per pt dx 1990s  multiple sites,  treated w/ methotrexate   Right ureteral calculus    SUI (stress urinary incontinence, female)    Thyroid nodule    single left nodule ;   bx 11-26-2012  benign   Wears contact lenses    Past Surgical History:  Procedure Laterality Date   COLONOSCOPY WITH PROPOFOL  02/03/2017   dr stark   CYSTOSCOPY W/ RETROGRADES  06/24/2023   Procedure: CYSTOSCOPY WITH RETROGRADE PYELOGRAM;  Surgeon: Despina Arias, MD;  Location: [facility];  Service: Urology;;   CYSTOSCOPY W/ URETERAL STENT PLACEMENT Right 06/06/2023   Procedure: CYSTOSCOPY WITH RIGHT RETROGRADE PYELOGRAM/RIGHT URETERAL STENT PLACEMENT;  Surgeon: Despina Arias, MD;  Location: [facility];  Service: Urology;  Laterality: Right;   CYSTOSCOPY/URETEROSCOPY/HOLMIUM LASER/STENT PLACEMENT Right 06/24/2023   Procedure: RIGHT URETEROSCOPY/HOLMIUM LASER/STENT EXCHANGE;  Surgeon: Despina Arias, MD;  Location: [facility];  Service: Urology;  Laterality: Right;  60 MINUTES NEEDED FOR CASE   DORSAL COMPARTMENT RELEASE Right 10/06/2008   @ [facility] by dr Merlyn Lot;  released first dorsal compartement w/ tenosynvectomy   OOPHORECTOMY  1983   per pt unilateral ,  unsure which side for dermoid cyst   TOTAL HIP ARTHROPLASTY Right 05/13/2022   Procedure: RIGHT TOTAL HIP ARTHROPLASTY ANTERIOR APPROACH;  Surgeon: Tarry Kos, MD;  Location: [facility];  Service: Orthopedics;  Laterality: Right;  3-C   WISDOM TOOTH EXTRACTION     age 61s   Patient Active Problem List   Diagnosis Date Noted   Spondylolisthesis of lumbar region 12/04/2023   Status post total replacement of right hip 05/13/2022   Primary osteoarthritis of right hip 05/12/2022   Age-related osteoporosis without current pathological fracture 10/22/2021   Body mass index (BMI) 33.0-33.9, adult 10/22/2021   Hypercalcemia 10/22/2021   Primary osteoarthritis 10/22/2021   Rheumatoid arthritis (HCC) 12/23/2004    PCP: Geoffry Paradise, MD  REFERRING PROVIDER: Tressie Stalker, MD  REFERRING DIAG: M43.16 (ICD-10-CM) - Spondylolisthesis of lumbar region  Rationale for Evaluation and Treatment: Rehabilitation  THERAPY DIAG:  Other low back pain - Plan: PT plan of care cert/re-cert  Muscle weakness (generalized) - Plan: PT plan of care cert/re-cert  Radiculopathy, lumbar region - Plan: PT plan of  care cert/re-cert  Other symptoms and signs involving the musculoskeletal system - Plan: PT plan of care cert/re-cert  ONSET DATE: DOS: 12/04/23   SUBJECTIVE:                                                                                                                                                                                           SUBJECTIVE STATEMENT: Pt had L2-3 and L4-5 TLIF on 12/04/23.  She reports pain and recovery has gone well except she still has some pain that initiates around the SIJ and radiates around the hip to the front and  down the anterior thigh to her knee.    PERTINENT HISTORY:  DDD, hypercalcemia, HTN, hypothyroidism, OA, RA, R THA 2023  PAIN:  Are you having pain? Yes: NPRS scale: 3 currently, up to 7, at best 3/10 Pain location: Rt low back radiating around to anterior thigh Pain description: aching Aggravating factors: standing, walking Relieving factors: medication, sitting  PRECAUTIONS:  Back  RED FLAGS: None   WEIGHT BEARING RESTRICTIONS:  No  FALLS:  Has patient fallen in last 6 months? No  LIVING ENVIRONMENT: Lives with: lives with their spouse Lives in: House/apartment Stairs:  bedroom on first floor, 2 steps with no rail to enter Has following equipment at home: Single point cane  OCCUPATION:  Retired Chief Executive Officer  PLOF:  Independent and Leisure: read, crochet, puzzles, no regular exercise  PATIENT GOALS:  Improve pain and mobility   OBJECTIVE:  Note: Objective measures were completed at Evaluation unless otherwise noted.  PATIENT SURVEYS:  01/12/24 FOTO 58 (predicted 60)  COGNITIVE STATUS: Within functional limits for tasks assessed   SENSATION: WFL  POSTURE:  rounded shoulders, forward head, and increased lumbar lordosis  GAIT: 01/12/24 Comments: amb with SPC or no cane independently  PALPATION: 01/12/24 trigger points in glute med noted, pain at Rt SIJ  LOWER EXTREMITY ROM: Deferred due to post op status  LOWER EXTREMITY MMT:    MMT Right eval Left eval  Hip flexion 3+/5 with pain 4/5  Hip extension 2/5 2/5  Hip abduction 3/5 3/5  Hip adduction    Hip internal rotation    Hip external rotation    Knee flexion 3/5 3/5  Knee extension 4/5 4/5  Ankle dorsiflexion 5/5 5/5   (Blank rows = not tested)     TREATMENT:  DATE:  01/12/24 See HEP - demonstrated with trial reps performed PRN, min cues for comprehension    Demonstrated use of tennis ball for self STM     PATIENT EDUCATION:  Education details: HEP, aquatics Person educated: Patient Education method: Programmer, multimedia, Facilities manager, and Handouts Education comprehension: verbalized understanding, returned demonstration, and needs further education  HOME EXERCISE PROGRAM: Access Code: [email or web address] URL: [email or web address] Date: 01/12/2024 Prepared by: Moshe Cipro  Exercises - Hooklying Single Knee to Chest  - 2 x daily - 7 x weekly - 1 sets - 3 reps - 30 sec hold - Supine Piriformis Stretch with Foot on Ground  - 2 x daily - 7 x weekly - 1 sets - 3 reps - 30 sec hold - Supine Hamstring Stretch  - 2 x daily - 7 x weekly - 1 sets - 3 reps - 30 sec hold - Supine Posterior Pelvic Tilt  - 2 x daily - 7 x weekly - 1 sets - 10 reps - 5 sec hold - Hooklying Isometric Hip Flexion with Opposite Arm  - 1 x daily - 7 x weekly - 3 sets - 10 reps   ASSESSMENT:  CLINICAL IMPRESSION: Patient is a 83 y.o. female who was seen today for physical therapy evaluation and treatment for s/p L4/5 TLIF. She demonstrates decreased strength with continued pain and trigger points affecting functional mobility.  She will benefit from PT to address deficits listed.     OBJECTIVE IMPAIRMENTS: decreased activity tolerance, decreased balance, decreased mobility, decreased strength, increased fascial restrictions, increased muscle spasms, postural dysfunction, and pain.   ACTIVITY LIMITATIONS: carrying, lifting, standing, squatting, sleeping, and locomotion level  PARTICIPATION LIMITATIONS: meal prep, cleaning, laundry, driving, shopping, and community activity  PERSONAL FACTORS: 3+ comorbidities: DDD, hypercalcemia, HTN, hypothyroidism, OA, RA, R THA 2023  are also affecting patient's functional outcome.   REHAB POTENTIAL: Good  CLINICAL DECISION MAKING: Evolving/moderate complexity  EVALUATION COMPLEXITY: Moderate   GOALS: Goals reviewed  with patient? Yes  SHORT TERM GOALS: Target date: 02/02/2024  Independent with initial HEP Goal status: INITIAL   LONG TERM GOALS: Target date: 02/23/2024   Independent with final HEP Goal status: INITIAL  2.  FOTO score improved to 60 Goal status: INITIAL  3.  Bil LE strength improved at least 1 muscle grade for improved mobility and function Goal status: INIITAL  4.  Report pain < 3/10 with standing and walking for improved function Goal status: INITIAL    PLAN:  PT FREQUENCY: 2x/week  PT DURATION: 6 weeks  PLANNED INTERVENTIONS: 97164- PT Re-evaluation, 97110-Therapeutic exercises, 97530- Therapeutic activity, [id number]- Neuromuscular re-education, 97535- Self Care, 27253- Manual therapy, [id number]- Gait training, [phone]- Aquatic Therapy, 97014- Electrical stimulation (unattended), Patient/Family education, Balance training, Stair training, Taping, Dry Needling, Cryotherapy, and Moist heat.  PLAN FOR NEXT SESSION: review HEP, core and hip strengthening maintaining neutral core   NEXT MD VISIT: PRN   Clarita Crane, PT, DPT 01/12/24 2:26 PM   Date of referral: 12/30/23 Referring provider: Tressie Stalker, MD Referring diagnosis? M43.16 Treatment diagnosis? (if different than referring diagnosis) M54.59, M62.81, M54.16, R29.898  What was this (referring dx) caused by? Surgery (Type: lumbar fusion)  Nature of Condition: Initial Onset (within last 3 months)   Laterality: Rt  Current Functional Measure Score: FOTO 58  Objective measurements identify impairments when they are compared to normal values, the uninvolved extremity, and prior level of function.  [x]  Yes  []  No  Objective assessment of functional ability: Moderate functional limitations  Briefly describe symptoms: Rt side low back pain, radiates to Rt anterior thigh  How did symptoms start: spontaneous  Average pain intensity:  Last 24 hours: 5  Past week: 5  How often does the pt experience  symptoms? Constantly  How much have the symptoms interfered with usual daily activities? Moderately  How has condition changed since care began at this facility? NA - initial visit  In general, how is the patients overall health? Very Good   BACK PAIN (STarT Back Screening Tool) No

## 2024-01-14 ENCOUNTER — Encounter: Payer: Self-pay | Admitting: *Deleted

## 2024-01-14 NOTE — Congregational Nurse Program (Signed)
Spoke with patient on 2125/has started physical therapy for leg pain that started in the lower sacral area and traveled down and around the affected leg.  States that the therapy has helped and she is able to move better.  Encouraged to stay in therapy and contact md that did the surgery if pain does not resolve.

## 2024-01-15 ENCOUNTER — Encounter: Payer: Medicare Other | Admitting: Physical Therapy

## 2024-01-16 ENCOUNTER — Ambulatory Visit (INDEPENDENT_AMBULATORY_CARE_PROVIDER_SITE_OTHER): Payer: Medicare Other | Admitting: Physical Therapy

## 2024-01-16 ENCOUNTER — Encounter: Payer: Self-pay | Admitting: Physical Therapy

## 2024-01-16 DIAGNOSIS — M5459 Other low back pain: Secondary | ICD-10-CM | POA: Diagnosis not present

## 2024-01-16 DIAGNOSIS — M6281 Muscle weakness (generalized): Secondary | ICD-10-CM

## 2024-01-16 DIAGNOSIS — M5416 Radiculopathy, lumbar region: Secondary | ICD-10-CM | POA: Diagnosis not present

## 2024-01-16 DIAGNOSIS — R29898 Other symptoms and signs involving the musculoskeletal system: Secondary | ICD-10-CM | POA: Diagnosis not present

## 2024-01-16 NOTE — Therapy (Signed)
OUTPATIENT PHYSICAL THERAPY TREATMENT   Patient Name: Allison Whitaker MRN: [id number] DOB:[birth year], 83 y.o., female Today's Date: 01/16/2024  END OF SESSION:  PT End of Session - 01/16/24 0924     Visit Number 2    Number of Visits 12    Date for PT Re-Evaluation 02/23/24    Authorization Type [employer]HC Medicare    Progress Note Due on Visit 10    PT Start Time 0915    PT Stop Time 1000    PT Time Calculation (min) 45 min    Activity Tolerance Patient tolerated treatment well    Behavior During Therapy WFL for tasks assessed/performed             Past Medical History:  Diagnosis Date   Anemia    DDD (degenerative disc disease), lumbosacral    GERD (gastroesophageal reflux disease)    Hiatal hernia    History of adenomatous polyp of colon    History of hypercalcemia    History of kidney stones    Hyperlipidemia    Hypertension    Hypothyroidism    followed by pcp   OA (osteoarthritis)    RA (rheumatoid arthritis) [facility])    rheumatologist--- dr Dierdre Forth; per pt dx 1990s  multiple sites,  treated w/ methotrexate   Right ureteral calculus    SUI (stress urinary incontinence, female)    Thyroid nodule    single left nodule ;   bx 11-26-2012  benign   Wears contact lenses    Past Surgical History:  Procedure Laterality Date   COLONOSCOPY WITH PROPOFOL  02/03/2017   dr stark   CYSTOSCOPY W/ RETROGRADES  06/24/2023   Procedure: CYSTOSCOPY WITH RETROGRADE PYELOGRAM;  Surgeon: Despina Arias, MD;  Location: [facility];  Service: Urology;;   CYSTOSCOPY W/ URETERAL STENT PLACEMENT Right 06/06/2023   Procedure: CYSTOSCOPY WITH RIGHT RETROGRADE PYELOGRAM/RIGHT URETERAL STENT PLACEMENT;  Surgeon: Despina Arias, MD;  Location: [facility];  Service: Urology;  Laterality: Right;   CYSTOSCOPY/URETEROSCOPY/HOLMIUM LASER/STENT PLACEMENT Right 06/24/2023   Procedure: RIGHT URETEROSCOPY/HOLMIUM LASER/STENT EXCHANGE;  Surgeon: Despina Arias, MD;  Location: [facility];  Service: Urology;  Laterality: Right;  60 MINUTES NEEDED FOR CASE   DORSAL COMPARTMENT RELEASE Right 10/06/2008   @ [facility] by dr Merlyn Lot;  released first dorsal compartement w/ tenosynvectomy   OOPHORECTOMY  1983   per pt unilateral ,  unsure which side for dermoid cyst   TOTAL HIP ARTHROPLASTY Right 05/13/2022   Procedure: RIGHT TOTAL HIP ARTHROPLASTY ANTERIOR APPROACH;  Surgeon: Tarry Kos, MD;  Location: [facility];  Service: Orthopedics;  Laterality: Right;  3-C   WISDOM TOOTH EXTRACTION     age 89s   Patient Active Problem List   Diagnosis Date Noted   Spondylolisthesis of lumbar region 12/04/2023   Status post total replacement of right hip 05/13/2022   Primary osteoarthritis of right hip 05/12/2022   Age-related osteoporosis without current pathological fracture 10/22/2021   Body mass index (BMI) 33.0-33.9, adult 10/22/2021   Hypercalcemia 10/22/2021   Primary osteoarthritis 10/22/2021   Rheumatoid arthritis (HCC) 12/23/2004    PCP: Geoffry Paradise, MD  REFERRING PROVIDER: Tressie Stalker, MD  REFERRING DIAG: M43.16 (ICD-10-CM) - Spondylolisthesis of lumbar region  Rationale for Evaluation and Treatment: Rehabilitation  THERAPY DIAG:  Other low back pain  Muscle weakness (generalized)  Radiculopathy, lumbar region  Other symptoms and signs involving the musculoskeletal system  ONSET DATE: DOS: 12/04/23   SUBJECTIVE:  SUBJECTIVE STATEMENT: Relays 3/10 pain upon arrival    PERTINENT HISTORY:  DDD, hypercalcemia, HTN, hypothyroidism, OA, RA, R THA 2023  PAIN:  Are you having pain? Yes: NPRS scale: 3 currently, up to 7, at best 3/10 Pain location: Rt low back radiating around to anterior thigh Pain description: aching Aggravating factors: standing,  walking Relieving factors: medication, sitting  PRECAUTIONS:  Back  RED FLAGS: None   WEIGHT BEARING RESTRICTIONS:  No  FALLS:  Has patient fallen in last 6 months? No  LIVING ENVIRONMENT: Lives with: lives with their spouse Lives in: House/apartment Stairs:  bedroom on first floor, 2 steps with no rail to enter Has following equipment at home: Single point cane  OCCUPATION:  Retired Chief Executive Officer  PLOF:  Independent and Leisure: read, crochet, puzzles, no regular exercise  PATIENT GOALS:  Improve pain and mobility   OBJECTIVE:  Note: Objective measures were completed at Evaluation unless otherwise noted.  PATIENT SURVEYS:  01/12/24 FOTO 58 (predicted 60)  COGNITIVE STATUS: Within functional limits for tasks assessed   SENSATION: WFL  POSTURE:  rounded shoulders, forward head, and increased lumbar lordosis  GAIT: 01/12/24 Comments: amb with SPC or no cane independently  PALPATION: 01/12/24 trigger points in glute med noted, pain at Rt SIJ  LOWER EXTREMITY ROM: Deferred due to post op status  LOWER EXTREMITY MMT:    MMT Right eval Left eval  Hip flexion 3+/5 with pain 4/5  Hip extension 2/5 2/5  Hip abduction 3/5 3/5  Hip adduction    Hip internal rotation    Hip external rotation    Knee flexion 3/5 3/5  Knee extension 4/5 4/5  Ankle dorsiflexion 5/5 5/5   (Blank rows = not tested)     TREATMENT:                                                                                                                              DATE:  01/14/24 Pt seen for aquatic therapy today.  Treatment took place in water 3.5-4.75 ft in depth at the [facility] pool. Temp of water was 91.  Pt entered/exited the pool via steps with hand rail.  Pt requires the buoyancy and hydrostatic pressure of water for support, and to offload joints by unweighting joint load by at least 50 % in navel deep water and by at least 75-80% in chest to neck deep water.   Viscosity of the water is needed for resistance of strengthening. Water current perturbations provides challenge to standing balance requiring increased core activation.  Aquatic PT exercises Standing hip abduction/adduction X 20 bilat Standing hip flexion/extension X 20 bilat Marches X 15 bilat Hamstring curls X 15 bilat Forward walking 2 round trips horizontal width of pool Seated on pool bench for knee extensions, hip flexion SLR, hip abd/add SLR, and cycling all X 20 each bilat Sit to stands from pool bench no UE support X 10 Standing L stretch 5 sec  X 10 holding flexion and holding extension Push/pull with kickboard X 10 each way, lunge stance Shoulder extensions with kickboard X 10 Water dumbell circles, stir the pot X 15 CW,CCW Lunge step to wall X 10 bilat Sidestepping 2 round trips horizontal width of pool Backwards  walking 2 round trips horizontal width of pool Floating on pool noodle 5 min with cycling for spinal decompression    01/12/24 See HEP - demonstrated with trial reps performed PRN, min cues for comprehension   Demonstrated use of tennis ball for self STM     PATIENT EDUCATION:  Education details: HEP, aquatics Person educated: Patient Education method: Programmer, multimedia, Facilities manager, and Handouts Education comprehension: verbalized understanding, returned demonstration, and needs further education  HOME EXERCISE PROGRAM: Access Code: [email or web address] URL: [email or web address] Date: 01/12/2024 Prepared by: Moshe Cipro  Exercises - Hooklying Single Knee to Chest  - 2 x daily - 7 x weekly - 1 sets - 3 reps - 30 sec hold - Supine Piriformis Stretch with Foot on Ground  - 2 x daily - 7 x weekly - 1 sets - 3 reps - 30 sec hold - Supine Hamstring Stretch  - 2 x daily - 7 x weekly - 1 sets - 3 reps - 30 sec hold - Supine Posterior Pelvic Tilt  - 2 x daily - 7 x weekly - 1 sets - 10 reps - 5 sec hold - Hooklying Isometric Hip Flexion with Opposite  Arm  - 1 x daily - 7 x weekly - 3 sets - 10 reps  Access Code: [email or web address] URL: [email or web address] Date: 01/16/2024 Prepared by: Ivery Quale  Exercises - Standing March at [facility]  - 1 x daily - 2 x weekly - 15 reps - Standing Hip Flexion Extension at [employer]  - 1 x daily - 2 x weekly - 15 reps - Standing Hip Abduction Adduction at Pool Wall  - 1 x daily - 2 x weekly - 20 reps - Forward Walking  - 1 x daily - 2 x weekly - 4 sets - Backward Walking  - 1 x daily - 2 x weekly - 4 sets - Side Stepping  - 2 x daily - 2 x weekly - 4 sets - 10 reps - Standing Knee Flexion  - 2 x daily - 6 x weekly - 3 sets - 10 reps - Squat  - 1 x daily - 2 x weekly - 15 reps - Lunge to Target at [employer]  - 1 x daily - 2 x weekly - 1 sets - 20 reps - Standing Single Leg Hip Circles  - 1 x daily - 2 x weekly - 1 sets - 20 reps   ASSESSMENT:  CLINICAL IMPRESSION: She had her first aquatic PT session today, fair to good overall tolerance, did need to have one seated rest break during. Overall reports less pain after floating with cycling for spinal decompression and lower extremity ROM. I did print out water HEP for her as well as she may join community pool.    OBJECTIVE IMPAIRMENTS: decreased activity tolerance, decreased balance, decreased mobility, decreased strength, increased fascial restrictions, increased muscle spasms, postural dysfunction, and pain.   ACTIVITY LIMITATIONS: carrying, lifting, standing, squatting, sleeping, and locomotion level  PARTICIPATION LIMITATIONS: meal prep, cleaning, laundry, driving, shopping, and community activity  PERSONAL FACTORS: 3+ comorbidities: DDD, hypercalcemia, HTN, hypothyroidism, OA, RA, R THA 2023  are also affecting patient's functional outcome.   REHAB POTENTIAL: Good  CLINICAL DECISION MAKING: Evolving/moderate complexity  EVALUATION COMPLEXITY: Moderate   GOALS: Goals reviewed with patient? Yes  SHORT TERM GOALS: Target date:  02/02/2024  Independent with initial HEP Goal status: INITIAL   LONG TERM GOALS: Target date: 02/23/2024   Independent with final HEP Goal status: INITIAL  2.  FOTO score improved to 60 Goal status: INITIAL  3.  Bil LE strength improved at least 1 muscle grade for improved mobility and function Goal status: INIITAL  4.  Report pain < 3/10 with standing and walking for improved function Goal status: INITIAL    PLAN:  PT FREQUENCY: 2x/week  PT DURATION: 6 weeks  PLANNED INTERVENTIONS: 97164- PT Re-evaluation, 97110-Therapeutic exercises, 97530- Therapeutic activity, [id number]- Neuromuscular re-education, 97535- Self Care, 78295- Manual therapy, [id number]- Gait training, [phone]- Aquatic Therapy, 97014- Electrical stimulation (unattended), Patient/Family education, Balance training, Stair training, Taping, Dry Needling, Cryotherapy, and Moist heat.  PLAN FOR NEXT SESSION: review land HEP, core and hip strengthening maintaining neutral core. She mentioned if massage gun would be okay for her so considering trying this on her next visit.   NEXT MD VISIT: PRN  Ivery Quale, PT, DPT 01/16/24 10:04 AM    Date of referral: 12/30/23 Referring provider: Geoffry Paradise, MD Referring diagnosis? M43.16 Treatment diagnosis? (if different than referring diagnosis) M54.59, M62.81, M54.16, R29.898  What was this (referring dx) caused by? Surgery (Type: lumbar fusion)  Nature of Condition: Initial Onset (within last 3 months)   Laterality: Rt  Current Functional Measure Score: FOTO 58  Objective measurements identify impairments when they are compared to normal values, the uninvolved extremity, and prior level of function.  [x]  Yes  []  No  Objective assessment of functional ability: Moderate functional limitations   Briefly describe symptoms: Rt side low back pain, radiates to Rt anterior thigh  How did symptoms start: spontaneous  Average pain intensity:  Last 24 hours: 5  Past  week: 5  How often does the pt experience symptoms? Constantly  How much have the symptoms interfered with usual daily activities? Moderately  How has condition changed since care began at this facility? NA - initial visit  In general, how is the patients overall health? Very Good   BACK PAIN (STarT Back Screening Tool) No

## 2024-01-21 ENCOUNTER — Ambulatory Visit: Payer: Medicare Other | Admitting: Physical Therapy

## 2024-01-21 ENCOUNTER — Encounter: Payer: Self-pay | Admitting: Physical Therapy

## 2024-01-21 DIAGNOSIS — M6281 Muscle weakness (generalized): Secondary | ICD-10-CM

## 2024-01-21 DIAGNOSIS — M5459 Other low back pain: Secondary | ICD-10-CM | POA: Diagnosis not present

## 2024-01-21 DIAGNOSIS — M5416 Radiculopathy, lumbar region: Secondary | ICD-10-CM

## 2024-01-21 DIAGNOSIS — R29898 Other symptoms and signs involving the musculoskeletal system: Secondary | ICD-10-CM

## 2024-01-21 NOTE — Therapy (Signed)
OUTPATIENT PHYSICAL THERAPY TREATMENT   Patient Name: Allison Whitaker MRN: [id number] DOB:02-19-1941, 83 y.o., female Today's Date: 01/21/2024  END OF SESSION:  PT End of Session - 01/21/24 1328     Visit Number 3    Number of Visits 12    Date for PT Re-Evaluation 02/23/24    Authorization Type [employer] Medicare    Progress Note Due on Visit 10    PT Start Time 1330    PT Stop Time 1415    PT Time Calculation (min) 45 min    Activity Tolerance Patient tolerated treatment well    Behavior During Therapy WFL for tasks assessed/performed              Past Medical History:  Diagnosis Date   Anemia    DDD (degenerative disc disease), lumbosacral    GERD (gastroesophageal reflux disease)    Hiatal hernia    History of adenomatous polyp of colon    History of hypercalcemia    History of kidney stones    Hyperlipidemia    Hypertension    Hypothyroidism    followed by pcp   OA (osteoarthritis)    RA (rheumatoid arthritis) [facility])    rheumatologist--- dr Dierdre Forth; per pt dx 1990s  multiple sites,  treated w/ methotrexate   Right ureteral calculus    SUI (stress urinary incontinence, female)    Thyroid nodule    single left nodule ;   bx 11-26-2012  benign   Wears contact lenses    Past Surgical History:  Procedure Laterality Date   COLONOSCOPY WITH PROPOFOL  02/03/2017   dr stark   CYSTOSCOPY W/ RETROGRADES  06/24/2023   Procedure: CYSTOSCOPY WITH RETROGRADE PYELOGRAM;  Surgeon: Despina Arias, MD;  Location: [facility] [place];  Service: Urology;;   CYSTOSCOPY W/ URETERAL STENT PLACEMENT Right 06/06/2023   Procedure: CYSTOSCOPY WITH RIGHT RETROGRADE PYELOGRAM/RIGHT URETERAL STENT PLACEMENT;  Surgeon: Despina Arias, MD;  Location: [facility];  Service: Urology;  Laterality: Right;   CYSTOSCOPY/URETEROSCOPY/HOLMIUM LASER/STENT PLACEMENT Right 06/24/2023   Procedure: RIGHT URETEROSCOPY/HOLMIUM LASER/STENT EXCHANGE;  Surgeon: Despina Arias, MD;  Location: [facility];  Service: Urology;  Laterality: Right;  60 MINUTES NEEDED FOR CASE   DORSAL COMPARTMENT RELEASE Right 10/06/2008   @ [facility] by dr Merlyn Lot;  released first dorsal compartement w/ tenosynvectomy   OOPHORECTOMY  1983   per pt unilateral ,  unsure which side for dermoid cyst   TOTAL HIP ARTHROPLASTY Right 05/13/2022   Procedure: RIGHT TOTAL HIP ARTHROPLASTY ANTERIOR APPROACH;  Surgeon: Tarry Kos, MD;  Location: [facility];  Service: Orthopedics;  Laterality: Right;  3-C   WISDOM TOOTH EXTRACTION     age 62s   Patient Active Problem List   Diagnosis Date Noted   Spondylolisthesis of lumbar region 12/04/2023   Status post total replacement of right hip 05/13/2022   Primary osteoarthritis of right hip 05/12/2022   Age-related osteoporosis without current pathological fracture 10/22/2021   Body mass index (BMI) 33.0-33.9, adult 10/22/2021   Hypercalcemia 10/22/2021   Primary osteoarthritis 10/22/2021   Rheumatoid arthritis (HCC) 12/23/2004    PCP: Geoffry Paradise, MD  REFERRING PROVIDER: Tressie Stalker, MD  REFERRING DIAG: M43.16 (ICD-10-CM) - Spondylolisthesis of lumbar region  Rationale for Evaluation and Treatment: Rehabilitation  THERAPY DIAG:  Other low back pain  Muscle weakness (generalized)  Radiculopathy, lumbar region  Other symptoms and signs involving the musculoskeletal system  ONSET DATE: DOS: 12/04/23   SUBJECTIVE:  SUBJECTIVE STATEMENT: Pain "hasn't been great."  Reports no big improvements yet.  PERTINENT HISTORY:  DDD, hypercalcemia, HTN, hypothyroidism, OA, RA, R THA 2023  PAIN:  Are you having pain? Yes: NPRS scale: 5 currently, up to 7, at best 3/10 Pain location: Rt low back radiating around to anterior thigh Pain description: aching Aggravating factors:  standing, walking Relieving factors: medication, sitting  PRECAUTIONS:  Back  RED FLAGS: None   WEIGHT BEARING RESTRICTIONS:  No  FALLS:  Has patient fallen in last 6 months? No  LIVING ENVIRONMENT: Lives with: lives with their spouse Lives in: House/apartment Stairs:  bedroom on first floor, 2 steps with no rail to enter Has following equipment at home: Single point cane  OCCUPATION:  Retired Chief Executive Officer  PLOF:  Independent and Leisure: read, crochet, puzzles, no regular exercise  PATIENT GOALS:  Improve pain and mobility   OBJECTIVE:  Note: Objective measures were completed at Evaluation unless otherwise noted.  PATIENT SURVEYS:  01/12/24 FOTO 58 (predicted 60)  COGNITIVE STATUS: Within functional limits for tasks assessed   SENSATION: WFL  POSTURE:  rounded shoulders, forward head, and increased lumbar lordosis  GAIT: 01/12/24 Comments: amb with SPC or no cane independently  PALPATION: 01/12/24 trigger points in glute med noted, pain at Rt SIJ  LOWER EXTREMITY ROM: Deferred due to post op status  LOWER EXTREMITY MMT:    MMT Right eval Left eval  Hip flexion 3+/5 with pain 4/5  Hip extension 2/5 2/5  Hip abduction 3/5 3/5  Hip adduction    Hip internal rotation    Hip external rotation    Knee flexion 3/5 3/5  Knee extension 4/5 4/5  Ankle dorsiflexion 5/5 5/5   (Blank rows = not tested)     TREATMENT:                                                                                                                              DATE:  01/21/24 TherEx NuStep L6 x 8 min Single knee to chest 2x30 sec bil Supine piriformis stretch 2x30 sec bil Pelvic tilts 10 x 5 sec hold Opp arm/leg isometric flexion x10 reps bil Single limb hooklying clamshell x15 reps bil; L3 band   01/14/24 Pt seen for aquatic therapy today.  Treatment took place in water 3.5-4.75 ft in depth at the [facility] pool. Temp of water was 91.  Pt  entered/exited the pool via steps with hand rail.  Pt requires the buoyancy and hydrostatic pressure of water for support, and to offload joints by unweighting joint load by at least 50 % in navel deep water and by at least 75-80% in chest to neck deep water.  Viscosity of the water is needed for resistance of strengthening. Water current perturbations provides challenge to standing balance requiring increased core activation.  Aquatic PT exercises Standing hip abduction/adduction X 20 bilat Standing hip flexion/extension X 20 bilat Marches X 15 bilat Hamstring curls  X 15 bilat Forward walking 2 round trips horizontal width of pool Seated on pool bench for knee extensions, hip flexion SLR, hip abd/add SLR, and cycling all X 20 each bilat Sit to stands from pool bench no UE support X 10 Standing L stretch 5 sec X 10 holding flexion and holding extension Push/pull with kickboard X 10 each way, lunge stance Shoulder extensions with kickboard X 10 Water dumbell circles, stir the pot X 15 CW,CCW Lunge step to wall X 10 bilat Sidestepping 2 round trips horizontal width of pool Backwards  walking 2 round trips horizontal width of pool Floating on pool noodle 5 min with cycling for spinal decompression    01/12/24 See HEP - demonstrated with trial reps performed PRN, min cues for comprehension   Demonstrated use of tennis ball for self STM     PATIENT EDUCATION:  Education details: HEP, aquatics Person educated: Patient Education method: Programmer, multimedia, Facilities manager, and Handouts Education comprehension: verbalized understanding, returned demonstration, and needs further education  HOME EXERCISE PROGRAM: Access Code: [email or web address] URL: [email or web address] Date: 01/21/2024 Prepared by: Moshe Cipro  Exercises - Hooklying Single Knee to Chest  - 2 x daily - 7 x weekly - 1 sets - 3 reps - 30 sec hold - Supine Piriformis Stretch with Foot on Ground  - 2 x daily - 7 x  weekly - 1 sets - 3 reps - 30 sec hold - Supine Hamstring Stretch  - 2 x daily - 7 x weekly - 1 sets - 3 reps - 30 sec hold - Supine Posterior Pelvic Tilt  - 2 x daily - 7 x weekly - 1 sets - 10 reps - 5 sec hold - Hooklying Isometric Hip Flexion with Opposite Arm  - 2 x daily - 7 x weekly - 3 sets - 10 reps - Hooklying Single Leg Bent Knee Fallouts with Resistance  - 2 x daily - 7 x weekly - 1-2 sets - 10 reps  Access Code: [email or web address] URL: [email or web address] Date: 01/16/2024 Prepared by: Ivery Quale  Exercises - Standing March at [facility]  - 1 x daily - 2 x weekly - 15 reps - Standing Hip Flexion Extension at [employer]  - 1 x daily - 2 x weekly - 15 reps - Standing Hip Abduction Adduction at Pool Wall  - 1 x daily - 2 x weekly - 20 reps - Forward Walking  - 1 x daily - 2 x weekly - 4 sets - Backward Walking  - 1 x daily - 2 x weekly - 4 sets - Side Stepping  - 2 x daily - 2 x weekly - 4 sets - 10 reps - Standing Knee Flexion  - 2 x daily - 6 x weekly - 3 sets - 10 reps - Squat  - 1 x daily - 2 x weekly - 15 reps - Lunge to Target at [employer]  - 1 x daily - 2 x weekly - 1 sets - 20 reps - Standing Single Leg Hip Circles  - 1 x daily - 2 x weekly - 1 sets - 20 reps   ASSESSMENT:  CLINICAL IMPRESSION: Session today focused on review of HEP with pt demonstrating good understanding of exercises. Did add additional exercise today and will continue to benefit from PT to maximize function.  STG #1 met.   OBJECTIVE IMPAIRMENTS: decreased activity tolerance, decreased balance, decreased mobility, decreased strength, increased fascial restrictions, increased muscle spasms, postural dysfunction, and pain.   ACTIVITY LIMITATIONS: carrying, lifting,  standing, squatting, sleeping, and locomotion level  PARTICIPATION LIMITATIONS: meal prep, cleaning, laundry, driving, shopping, and community activity  PERSONAL FACTORS: 3+ comorbidities: DDD, hypercalcemia, HTN, hypothyroidism,  OA, RA, R THA 2023  are also affecting patient's functional outcome.   REHAB POTENTIAL: Good  CLINICAL DECISION MAKING: Evolving/moderate complexity  EVALUATION COMPLEXITY: Moderate   GOALS: Goals reviewed with patient? Yes  SHORT TERM GOALS: Target date: 02/02/2024  Independent with initial HEP Goal status: MET 01/21/24   LONG TERM GOALS: Target date: 02/23/2024   Independent with final HEP Goal status: INITIAL  2.  FOTO score improved to 60 Goal status: INITIAL  3.  Bil LE strength improved at least 1 muscle grade for improved mobility and function Goal status: INIITAL  4.  Report pain < 3/10 with standing and walking for improved function Goal status: INITIAL    PLAN:  PT FREQUENCY: 2x/week  PT DURATION: 6 weeks  PLANNED INTERVENTIONS: 97164- PT Re-evaluation, 97110-Therapeutic exercises, 97530- Therapeutic activity, [id number]- Neuromuscular re-education, 97535- Self Care, 16109- Manual therapy, [id number]- Gait training, [phone]- Aquatic Therapy, 97014- Electrical stimulation (unattended), Patient/Family education, Balance training, Stair training, Taping, Dry Needling, Cryotherapy, and Moist heat.  PLAN FOR NEXT SESSION: update HEP PRN, core and hip strengthening maintaining neutral core. She mentioned if massage gun would be okay for her so considering trying this on her next visit.   NEXT MD VISIT: PRN  Clarita Crane, PT, DPT 01/21/24 2:18 PM     Date of referral: 12/30/23 Referring provider: Tressie Stalker, MD Referring diagnosis? M43.16 Treatment diagnosis? (if different than referring diagnosis) M54.59, M62.81, M54.16, R29.898  What was this (referring dx) caused by? Surgery (Type: lumbar fusion)  Nature of Condition: Initial Onset (within last 3 months)   Laterality: Rt  Current Functional Measure Score: FOTO 58  Objective measurements identify impairments when they are compared to normal values, the uninvolved extremity, and prior level of  function.  [x]  Yes  []  No  Objective assessment of functional ability: Moderate functional limitations   Briefly describe symptoms: Rt side low back pain, radiates to Rt anterior thigh  How did symptoms start: spontaneous  Average pain intensity:  Last 24 hours: 5  Past week: 5  How often does the pt experience symptoms? Constantly  How much have the symptoms interfered with usual daily activities? Moderately  How has condition changed since care began at this facility? NA - initial visit  In general, how is the patients overall health? Very Good   BACK PAIN (STarT Back Screening Tool) No

## 2024-01-26 ENCOUNTER — Encounter: Payer: Self-pay | Admitting: Physical Therapy

## 2024-01-26 ENCOUNTER — Ambulatory Visit: Payer: Medicare Other | Admitting: Physical Therapy

## 2024-01-26 DIAGNOSIS — R29898 Other symptoms and signs involving the musculoskeletal system: Secondary | ICD-10-CM

## 2024-01-26 DIAGNOSIS — M6281 Muscle weakness (generalized): Secondary | ICD-10-CM

## 2024-01-26 DIAGNOSIS — M5416 Radiculopathy, lumbar region: Secondary | ICD-10-CM | POA: Diagnosis not present

## 2024-01-26 DIAGNOSIS — M5459 Other low back pain: Secondary | ICD-10-CM

## 2024-01-26 NOTE — Therapy (Signed)
OUTPATIENT PHYSICAL THERAPY TREATMENT   Patient Name: Allison Whitaker MRN: [id number] DOB:30-Oct-1941, 83 y.o., female Today's Date: 01/26/2024  END OF SESSION:  PT End of Session - 01/26/24 1336     Visit Number 4    Number of Visits 12    Date for PT Re-Evaluation 02/23/24    Authorization Type [employer] Medicare    Progress Note Due on Visit 10    PT Start Time 1337    PT Stop Time 1425    PT Time Calculation (min) 48 min    Activity Tolerance Patient tolerated treatment well    Behavior During Therapy WFL for tasks assessed/performed               Past Medical History:  Diagnosis Date   Anemia    DDD (degenerative disc disease), lumbosacral    GERD (gastroesophageal reflux disease)    Hiatal hernia    History of adenomatous polyp of colon    History of hypercalcemia    History of kidney stones    Hyperlipidemia    Hypertension    Hypothyroidism    followed by pcp   OA (osteoarthritis)    RA (rheumatoid arthritis) [facility])    rheumatologist--- dr Dierdre Forth; per pt dx 1990s  multiple sites,  treated w/ methotrexate   Right ureteral calculus    SUI (stress urinary incontinence, female)    Thyroid nodule    single left nodule ;   bx 11-26-2012  benign   Wears contact lenses    Past Surgical History:  Procedure Laterality Date   COLONOSCOPY WITH PROPOFOL  02/03/2017   dr stark   CYSTOSCOPY W/ RETROGRADES  06/24/2023   Procedure: CYSTOSCOPY WITH RETROGRADE PYELOGRAM;  Surgeon: Despina Arias, MD;  Location: [facility] [place];  Service: Urology;;   CYSTOSCOPY W/ URETERAL STENT PLACEMENT Right 06/06/2023   Procedure: CYSTOSCOPY WITH RIGHT RETROGRADE PYELOGRAM/RIGHT URETERAL STENT PLACEMENT;  Surgeon: Despina Arias, MD;  Location: [facility];  Service: Urology;  Laterality: Right;   CYSTOSCOPY/URETEROSCOPY/HOLMIUM LASER/STENT PLACEMENT Right 06/24/2023   Procedure: RIGHT URETEROSCOPY/HOLMIUM LASER/STENT EXCHANGE;  Surgeon: Despina Arias, MD;  Location: [facility];  Service: Urology;  Laterality: Right;  60 MINUTES NEEDED FOR CASE   DORSAL COMPARTMENT RELEASE Right 10/06/2008   @ [facility] by dr Merlyn Lot;  released first dorsal compartement w/ tenosynvectomy   OOPHORECTOMY  1983   per pt unilateral ,  unsure which side for dermoid cyst   TOTAL HIP ARTHROPLASTY Right 05/13/2022   Procedure: RIGHT TOTAL HIP ARTHROPLASTY ANTERIOR APPROACH;  Surgeon: Tarry Kos, MD;  Location: [facility];  Service: Orthopedics;  Laterality: Right;  3-C   WISDOM TOOTH EXTRACTION     age 30s   Patient Active Problem List   Diagnosis Date Noted   Spondylolisthesis of lumbar region 12/04/2023   Status post total replacement of right hip 05/13/2022   Primary osteoarthritis of right hip 05/12/2022   Age-related osteoporosis without current pathological fracture 10/22/2021   Body mass index (BMI) 33.0-33.9, adult 10/22/2021   Hypercalcemia 10/22/2021   Primary osteoarthritis 10/22/2021   Rheumatoid arthritis (HCC) 12/23/2004    PCP: Geoffry Paradise, MD  REFERRING PROVIDER: Tressie Stalker, MD  REFERRING DIAG: M43.16 (ICD-10-CM) - Spondylolisthesis of lumbar region  Rationale for Evaluation and Treatment: Rehabilitation  THERAPY DIAG:  Other low back pain  Muscle weakness (generalized)  Radiculopathy, lumbar region  Other symptoms and signs involving the musculoskeletal system  ONSET DATE: DOS: 12/04/23   SUBJECTIVE:  SUBJECTIVE STATEMENT: Didn't bring her cane today; seems to get in the way more than help for shorter distances.  Feels like she's slowly getting better.  PERTINENT HISTORY:  DDD, hypercalcemia, HTN, hypothyroidism, OA, RA, R THA 2023  PAIN:  Are you having pain? Yes: NPRS scale: 5 currently, up to 7, at best 3/10 Pain location: Rt low back radiating  around to anterior thigh Pain description: aching Aggravating factors: standing, walking Relieving factors: medication, sitting  PRECAUTIONS:  Back  RED FLAGS: None   WEIGHT BEARING RESTRICTIONS:  No  FALLS:  Has patient fallen in last 6 months? No  LIVING ENVIRONMENT: Lives with: lives with their spouse Lives in: House/apartment Stairs:  bedroom on first floor, 2 steps with no rail to enter Has following equipment at home: Single point cane  OCCUPATION:  Retired Chief Executive Officer  PLOF:  Independent and Leisure: read, crochet, puzzles, no regular exercise  PATIENT GOALS:  Improve pain and mobility   OBJECTIVE:  Note: Objective measures were completed at Evaluation unless otherwise noted.  PATIENT SURVEYS:  01/12/24 FOTO 58 (predicted 60)  COGNITIVE STATUS: Within functional limits for tasks assessed   SENSATION: WFL  POSTURE:  rounded shoulders, forward head, and increased lumbar lordosis  GAIT: 01/12/24 Comments: amb with SPC or no cane independently  PALPATION: 01/12/24 trigger points in glute med noted, pain at Rt SIJ  LOWER EXTREMITY ROM: Deferred due to post op status  LOWER EXTREMITY MMT:    MMT Right eval Left eval  Hip flexion 3+/5 with pain 4/5  Hip extension 2/5 2/5  Hip abduction 3/5 3/5  Hip adduction    Hip internal rotation    Hip external rotation    Knee flexion 3/5 3/5  Knee extension 4/5 4/5  Ankle dorsiflexion 5/5 5/5   (Blank rows = not tested)     TREATMENT:                                                                                                                              DATE:  01/26/24 TherEx NuStep L6 x 8 min Rows L3 band 2x10; 5 sec hold Shoulder ext L3 ban 2x10; 3 sec hold Seated Rt hip flexor stretch 2x30 sec  TherAct (for functional tasks) Leg press 93# 2x10 bil; then single limb performed bil 50# 2x10 Modified plank from elevated surface with alternating hip extension x10 bil Squats 10# KB x10  reps each  Manual STM with percussive device for STM and trigger point release to Rt hip and glute; educated on purchase options  01/21/24 TherEx NuStep L6 x 8 min Single knee to chest 2x30 sec bil Supine piriformis stretch 2x30 sec bil Pelvic tilts 10 x 5 sec hold Opp arm/leg isometric flexion x10 reps bil Single limb hooklying clamshell x15 reps bil; L3 band   01/14/24 Pt seen for aquatic therapy today.  Treatment took place in water 3.5-4.75 ft in depth at the [facility] pool. Temp  of water was 91.  Pt entered/exited the pool via steps with hand rail.  Pt requires the buoyancy and hydrostatic pressure of water for support, and to offload joints by unweighting joint load by at least 50 % in navel deep water and by at least 75-80% in chest to neck deep water.  Viscosity of the water is needed for resistance of strengthening. Water current perturbations provides challenge to standing balance requiring increased core activation.  Aquatic PT exercises Standing hip abduction/adduction X 20 bilat Standing hip flexion/extension X 20 bilat Marches X 15 bilat Hamstring curls X 15 bilat Forward walking 2 round trips horizontal width of pool Seated on pool bench for knee extensions, hip flexion SLR, hip abd/add SLR, and cycling all X 20 each bilat Sit to stands from pool bench no UE support X 10 Standing L stretch 5 sec X 10 holding flexion and holding extension Push/pull with kickboard X 10 each way, lunge stance Shoulder extensions with kickboard X 10 Water dumbell circles, stir the pot X 15 CW,CCW Lunge step to wall X 10 bilat Sidestepping 2 round trips horizontal width of pool Backwards  walking 2 round trips horizontal width of pool Floating on pool noodle 5 min with cycling for spinal decompression    01/12/24 See HEP - demonstrated with trial reps performed PRN, min cues for comprehension   Demonstrated use of tennis ball for self STM     PATIENT EDUCATION:   Education details: HEP, aquatics Person educated: Patient Education method: Programmer, multimedia, Facilities manager, and Handouts Education comprehension: verbalized understanding, returned demonstration, and needs further education  HOME EXERCISE PROGRAM: Access Code: [email or web address] URL: [email or web address] Date: 01/21/2024 Prepared by: Moshe Cipro  Exercises - Hooklying Single Knee to Chest  - 2 x daily - 7 x weekly - 1 sets - 3 reps - 30 sec hold - Supine Piriformis Stretch with Foot on Ground  - 2 x daily - 7 x weekly - 1 sets - 3 reps - 30 sec hold - Supine Hamstring Stretch  - 2 x daily - 7 x weekly - 1 sets - 3 reps - 30 sec hold - Supine Posterior Pelvic Tilt  - 2 x daily - 7 x weekly - 1 sets - 10 reps - 5 sec hold - Hooklying Isometric Hip Flexion with Opposite Arm  - 2 x daily - 7 x weekly - 3 sets - 10 reps - Hooklying Single Leg Bent Knee Fallouts with Resistance  - 2 x daily - 7 x weekly - 1-2 sets - 10 reps  Access Code: [email or web address] URL: [email or web address] Date: 01/16/2024 Prepared by: Ivery Quale  Exercises - Standing March at [facility]  - 1 x daily - 2 x weekly - 15 reps - Standing Hip Flexion Extension at [employer]  - 1 x daily - 2 x weekly - 15 reps - Standing Hip Abduction Adduction at Pool Wall  - 1 x daily - 2 x weekly - 20 reps - Forward Walking  - 1 x daily - 2 x weekly - 4 sets - Backward Walking  - 1 x daily - 2 x weekly - 4 sets - Side Stepping  - 2 x daily - 2 x weekly - 4 sets - 10 reps - Standing Knee Flexion  - 2 x daily - 6 x weekly - 3 sets - 10 reps - Squat  - 1 x daily - 2 x weekly - 15 reps - Lunge to Target at [employer]  - 1 x daily -  2 x weekly - 1 sets - 20 reps - Standing Single Leg Hip Circles  - 1 x daily - 2 x weekly - 1 sets - 20 reps   ASSESSMENT:  CLINICAL IMPRESSION: Pt tolerated session well today with continued focus on maximizing functional mobility as well as some manual therapy to help with pain relief.   Fatigues quickly with standing activities.  Will continue to benefit from PT to maximize function.  OBJECTIVE IMPAIRMENTS: decreased activity tolerance, decreased balance, decreased mobility, decreased strength, increased fascial restrictions, increased muscle spasms, postural dysfunction, and pain.   ACTIVITY LIMITATIONS: carrying, lifting, standing, squatting, sleeping, and locomotion level  PARTICIPATION LIMITATIONS: meal prep, cleaning, laundry, driving, shopping, and community activity  PERSONAL FACTORS: 3+ comorbidities: DDD, hypercalcemia, HTN, hypothyroidism, OA, RA, R THA 2023  are also affecting patient's functional outcome.   REHAB POTENTIAL: Good  CLINICAL DECISION MAKING: Evolving/moderate complexity  EVALUATION COMPLEXITY: Moderate   GOALS: Goals reviewed with patient? Yes  SHORT TERM GOALS: Target date: 02/02/2024  Independent with initial HEP Goal status: MET 01/21/24   LONG TERM GOALS: Target date: 02/23/2024   Independent with final HEP Goal status: INITIAL  2.  FOTO score improved to 60 Goal status: INITIAL  3.  Bil LE strength improved at least 1 muscle grade for improved mobility and function Goal status: INIITAL  4.  Report pain < 3/10 with standing and walking for improved function Goal status: INITIAL    PLAN:  PT FREQUENCY: 2x/week  PT DURATION: 6 weeks  PLANNED INTERVENTIONS: 97164- PT Re-evaluation, 97110-Therapeutic exercises, 97530- Therapeutic activity, [id number]- Neuromuscular re-education, 97535- Self Care, 16109- Manual therapy, [id number]- Gait training, [phone]- Aquatic Therapy, 97014- Electrical stimulation (unattended), Patient/Family education, Balance training, Stair training, Taping, Dry Needling, Cryotherapy, and Moist heat.  PLAN FOR NEXT SESSION: core and hip strengthening maintaining neutral core. Manual/modalities PRN  NEXT MD VISIT: PRN  Clarita Crane, PT, DPT 01/26/24 2:39 PM     Date of referral: 12/30/23 Referring  provider: Tressie Stalker, MD Referring diagnosis? M43.16 Treatment diagnosis? (if different than referring diagnosis) M54.59, M62.81, M54.16, R29.898  What was this (referring dx) caused by? Surgery (Type: lumbar fusion)  Nature of Condition: Initial Onset (within last 3 months)   Laterality: Rt  Current Functional Measure Score: FOTO 58  Objective measurements identify impairments when they are compared to normal values, the uninvolved extremity, and prior level of function.  [x]  Yes  []  No  Objective assessment of functional ability: Moderate functional limitations   Briefly describe symptoms: Rt side low back pain, radiates to Rt anterior thigh  How did symptoms start: spontaneous  Average pain intensity:  Last 24 hours: 5  Past week: 5  How often does the pt experience symptoms? Constantly  How much have the symptoms interfered with usual daily activities? Moderately  How has condition changed since care began at this facility? NA - initial visit  In general, how is the patients overall health? Very Good   BACK PAIN (STarT Back Screening Tool) No

## 2024-01-30 ENCOUNTER — Encounter: Payer: Self-pay | Admitting: Physical Therapy

## 2024-01-30 ENCOUNTER — Ambulatory Visit (INDEPENDENT_AMBULATORY_CARE_PROVIDER_SITE_OTHER): Payer: Medicare Other | Admitting: Physical Therapy

## 2024-01-30 DIAGNOSIS — M5459 Other low back pain: Secondary | ICD-10-CM

## 2024-01-30 DIAGNOSIS — M5416 Radiculopathy, lumbar region: Secondary | ICD-10-CM

## 2024-01-30 DIAGNOSIS — M6281 Muscle weakness (generalized): Secondary | ICD-10-CM | POA: Diagnosis not present

## 2024-01-30 NOTE — Therapy (Signed)
 OUTPATIENT PHYSICAL THERAPY TREATMENT   Patient Name: Allison Whitaker MRN: [id number] DOB:1941-12-01, 83 y.o., female Today's Date: 01/30/2024  END OF SESSION:  PT End of Session - 01/30/24 0946     Visit Number 5    Number of Visits 12    Date for PT Re-Evaluation 02/23/24    Authorization Type [employer]    Progress Note Due on Visit 10    PT Start Time 0930    PT Stop Time 1015    PT Time Calculation (min) 45 min    Activity Tolerance Patient tolerated treatment well    Behavior During Therapy WFL for tasks assessed/performed               Past Medical History:  Diagnosis Date   Anemia    DDD (degenerative disc disease), lumbosacral    GERD (gastroesophageal reflux disease)    Hiatal hernia    History of adenomatous polyp of colon    History of hypercalcemia    History of kidney stones    Hyperlipidemia    Hypertension    Hypothyroidism    followed by pcp   OA (osteoarthritis)    RA (rheumatoid arthritis) [facility])    rheumatologist--- dr mai; per pt dx 1990s  multiple sites,  treated w/ methotrexate    Right ureteral calculus    SUI (stress urinary incontinence, female)    Thyroid  nodule    single left nodule ;   bx 11-26-2012  benign   Wears contact lenses    Past Surgical History:  Procedure Laterality Date   COLONOSCOPY WITH PROPOFOL   02/03/2017   dr stark   CYSTOSCOPY W/ RETROGRADES  06/24/2023   Procedure: CYSTOSCOPY WITH RETROGRADE PYELOGRAM;  Surgeon: Lovie Arlyss CROME, MD;  Location: [facility];  Service: Urology;;   CYSTOSCOPY W/ URETERAL STENT PLACEMENT Right 06/06/2023   Procedure: CYSTOSCOPY WITH RIGHT RETROGRADE PYELOGRAM/RIGHT URETERAL STENT PLACEMENT;  Surgeon: Lovie Arlyss CROME, MD;  Location: [facility];  Service: Urology;  Laterality: Right;   CYSTOSCOPY/URETEROSCOPY/HOLMIUM LASER/STENT PLACEMENT Right 06/24/2023   Procedure: RIGHT URETEROSCOPY/HOLMIUM LASER/STENT EXCHANGE;  Surgeon: Lovie Arlyss CROME, MD;  Location: [facility];  Service: Urology;  Laterality: Right;  60 MINUTES NEEDED FOR CASE   DORSAL COMPARTMENT RELEASE Right 10/06/2008   @ [facility] by dr murrell;  released first dorsal compartement w/ tenosynvectomy   OOPHORECTOMY  1983   per pt unilateral ,  unsure which side for dermoid cyst   TOTAL HIP ARTHROPLASTY Right 05/13/2022   Procedure: RIGHT TOTAL HIP ARTHROPLASTY ANTERIOR APPROACH;  Surgeon: Jerri Kay HERO, MD;  Location: [facility];  Service: Orthopedics;  Laterality: Right;  3-C   WISDOM TOOTH EXTRACTION     age 23s   Patient Active Problem List   Diagnosis Date Noted   Spondylolisthesis of lumbar region 12/04/2023   Status post total replacement of right hip 05/13/2022   Primary osteoarthritis of right hip 05/12/2022   Age-related osteoporosis without current pathological fracture 10/22/2021   Body mass index (BMI) 33.0-33.9, adult 10/22/2021   Hypercalcemia 10/22/2021   Primary osteoarthritis 10/22/2021   Rheumatoid arthritis (HCC) 12/23/2004    PCP: Shepard Ade, MD  REFERRING PROVIDER: Mavis Purchase, MD  REFERRING DIAG: M43.16 (ICD-10-CM) - Spondylolisthesis of lumbar region  Rationale for Evaluation and Treatment: Rehabilitation  THERAPY DIAG:  Other low back pain  Muscle weakness (generalized)  Radiculopathy, lumbar region  ONSET DATE: DOS: 12/04/23   SUBJECTIVE:  SUBJECTIVE STATEMENT: Relays her back is not bothering her much this morning. The pain is usually better in the mornings then can get worse as the day goes on.  PERTINENT HISTORY:  DDD, hypercalcemia, HTN, hypothyroidism, OA, RA, R THA 2023  PAIN:  Are you having pain? Yes: NPRS scale: 3/10 Pain location: Rt low back radiating around to anterior thigh Pain description: aching Aggravating factors: standing,  walking Relieving factors: medication, sitting  PRECAUTIONS:  Back  RED FLAGS: None   WEIGHT BEARING RESTRICTIONS:  No  FALLS:  Has patient fallen in last 6 months? No  LIVING ENVIRONMENT: Lives with: lives with their spouse Lives in: House/apartment Stairs:  bedroom on first floor, 2 steps with no rail to enter Has following equipment at home: Single point cane  OCCUPATION:  Retired chief executive officer  PLOF:  Independent and Leisure: read, crochet, puzzles, no regular exercise  PATIENT GOALS:  Improve pain and mobility   OBJECTIVE:  Note: Objective measures were completed at Evaluation unless otherwise noted.  PATIENT SURVEYS:  01/12/24 FOTO 58 (predicted 60)  COGNITIVE STATUS: Within functional limits for tasks assessed   SENSATION: WFL  POSTURE:  rounded shoulders, forward head, and increased lumbar lordosis  GAIT: 01/12/24 Comments: amb with SPC or no cane independently  PALPATION: 01/12/24 trigger points in glute med noted, pain at Rt SIJ  LOWER EXTREMITY ROM: Deferred due to post op status  LOWER EXTREMITY MMT:    MMT Right eval Left eval  Hip flexion 3+/5 with pain 4/5  Hip extension 2/5 2/5  Hip abduction 3/5 3/5  Hip adduction    Hip internal rotation    Hip external rotation    Knee flexion 3/5 3/5  Knee extension 4/5 4/5  Ankle dorsiflexion 5/5 5/5   (Blank rows = not tested)     TREATMENT:                                                                                                                              DATE:  01/30/24 Pt seen for aquatic therapy today.  Treatment took place in water 3.5-4.75 ft in depth at the [facility] pool. Temp of water was 91.  Pt entered/exited the pool via steps with hand rail.  Pt requires the buoyancy and hydrostatic pressure of water for support, and to offload joints by unweighting joint load by at least 50 % in navel deep water and by at least 75-80% in chest to neck deep water.   Viscosity of the water is needed for resistance of strengthening. Water current perturbations provides challenge to standing balance requiring increased core activation.  Aquatic PT exercises Standing hip abduction/adduction X 20 bilat Standing hip flexion/extension X 20 bilat Marches X 15 bilat Forward walking 2 round trips horizontal width of pool Sidestepping 2 round trips horizontal width of pool Backwards  walking 2 round trips horizontal width of pool Monster walking 2 round trips Tandem walking 2 round trips  Walking with hamstring curls 2 round trips Standing L stretch 5 sec X 10 holding flexion and holding extension Push/pull with kickboard X 15 each way, lunge stance Shoulder extensions with kickboard X 15 Water dumbell circles, stir the pot X 15 CW,CCW Lunge step to wall X 10 bilat Floating on pool noodle 5 min with cycling for spinal decompression  01/26/24 TherEx NuStep L6 x 8 min Rows L3 band 2x10; 5 sec hold Shoulder ext L3 ban 2x10; 3 sec hold Seated Rt hip flexor stretch 2x30 sec  TherAct (for functional tasks) Leg press 93# 2x10 bil; then single limb performed bil 50# 2x10 Modified plank from elevated surface with alternating hip extension x10 bil Squats 10# KB x10 reps each  Manual STM with percussive device for STM and trigger point release to Rt hip and glute; educated on purchase options  01/21/24 TherEx NuStep L6 x 8 min Single knee to chest 2x30 sec bil Supine piriformis stretch 2x30 sec bil Pelvic tilts 10 x 5 sec hold Opp arm/leg isometric flexion x10 reps bil Single limb hooklying clamshell x15 reps bil; L3 band     PATIENT EDUCATION:  Education details: HEP, aquatics Person educated: Patient Education method: Programmer, Multimedia, Facilities Manager, and Handouts Education comprehension: verbalized understanding, returned demonstration, and needs further education  HOME EXERCISE PROGRAM: Access Code: [email or web address] URL:  [email or web address] Date: 01/21/2024 Prepared by: Corean Ku  Exercises - Hooklying Single Knee to Chest  - 2 x daily - 7 x weekly - 1 sets - 3 reps - 30 sec hold - Supine Piriformis Stretch with Foot on Ground  - 2 x daily - 7 x weekly - 1 sets - 3 reps - 30 sec hold - Supine Hamstring Stretch  - 2 x daily - 7 x weekly - 1 sets - 3 reps - 30 sec hold - Supine Posterior Pelvic Tilt  - 2 x daily - 7 x weekly - 1 sets - 10 reps - 5 sec hold - Hooklying Isometric Hip Flexion with Opposite Arm  - 2 x daily - 7 x weekly - 3 sets - 10 reps - Hooklying Single Leg Bent Knee Fallouts with Resistance  - 2 x daily - 7 x weekly - 1-2 sets - 10 reps  Access Code: [email or web address] URL: [email or web address] Date: 01/16/2024 Prepared by: Redell Moose  Exercises - Standing March at [facility]  - 1 x daily - 2 x weekly - 15 reps - Standing Hip Flexion Extension at [employer]  - 1 x daily - 2 x weekly - 15 reps - Standing Hip Abduction Adduction at Pool Wall  - 1 x daily - 2 x weekly - 20 reps - Forward Walking  - 1 x daily - 2 x weekly - 4 sets - Backward Walking  - 1 x daily - 2 x weekly - 4 sets - Side Stepping  - 2 x daily - 2 x weekly - 4 sets - 10 reps - Standing Knee Flexion  - 2 x daily - 6 x weekly - 3 sets - 10 reps - Squat  - 1 x daily - 2 x weekly - 15 reps - Lunge to Target at [employer]  - 1 x daily - 2 x weekly - 1 sets - 20 reps - Standing Single Leg Hip Circles  - 1 x daily - 2 x weekly - 1 sets - 20 reps   ASSESSMENT:  CLINICAL IMPRESSION: Good overall tolerance to aquatic PT session. She has not yet joined [facility] for  pool access as she has been busy but she does still plan to.  She will continue to benefit from PT to maximize function.  OBJECTIVE IMPAIRMENTS: decreased activity tolerance, decreased balance, decreased mobility, decreased strength, increased fascial restrictions, increased muscle spasms, postural dysfunction, and pain.   ACTIVITY  LIMITATIONS: carrying, lifting, standing, squatting, sleeping, and locomotion level  PARTICIPATION LIMITATIONS: meal prep, cleaning, laundry, driving, shopping, and community activity  PERSONAL FACTORS: 3+ comorbidities: DDD, hypercalcemia, HTN, hypothyroidism, OA, RA, R THA 2023  are also affecting patient's functional outcome.   REHAB POTENTIAL: Good  CLINICAL DECISION MAKING: Evolving/moderate complexity  EVALUATION COMPLEXITY: Moderate   GOALS: Goals reviewed with patient? Yes  SHORT TERM GOALS: Target date: 02/02/2024  Independent with initial HEP Goal status: MET 01/21/24   LONG TERM GOALS: Target date: 02/23/2024   Independent with final HEP Goal status: INITIAL  2.  FOTO score improved to 60 Goal status: INITIAL  3.  Bil LE strength improved at least 1 muscle grade for improved mobility and function Goal status: INIITAL  4.  Report pain < 3/10 with standing and walking for improved function Goal status: INITIAL    PLAN:  PT FREQUENCY: 2x/week  PT DURATION: 6 weeks  PLANNED INTERVENTIONS: 97164- PT Re-evaluation, 97110-Therapeutic exercises, 97530- Therapeutic activity, [id number]- Neuromuscular re-education, 97535- Self Care, [id number]- Manual therapy, [id number]- Gait training, [phone]- Aquatic Therapy, 97014- Electrical stimulation (unattended), Patient/Family education, Balance training, Stair training, Taping, Dry Needling, Cryotherapy, and Moist heat.  PLAN FOR NEXT SESSION: core and hip strengthening maintaining neutral core. Manual/modalities PRN  NEXT MD VISIT: PRN  Redell Moose, PT, DPT 01/30/24 9:49 AM      Date of referral: 12/30/23 Referring provider: Mavis Purchase, MD Referring diagnosis? M43.16 Treatment diagnosis? (if different than referring diagnosis) M54.59, M62.81, M54.16, R29.898  What was this (referring dx) caused by? Surgery (Type: lumbar fusion)  Nature of Condition: Initial Onset (within last 3 months)   Laterality: Rt  Current  Functional Measure Score: FOTO 58  Objective measurements identify impairments when they are compared to normal values, the uninvolved extremity, and prior level of function.  [x]  Yes  []  No  Objective assessment of functional ability: Moderate functional limitations   Briefly describe symptoms: Rt side low back pain, radiates to Rt anterior thigh  How did symptoms start: spontaneous  Average pain intensity:  Last 24 hours: 5  Past week: 5  How often does the pt experience symptoms? Constantly  How much have the symptoms interfered with usual daily activities? Moderately  How has condition changed since care began at this facility? NA - initial visit  In general, how is the patients overall health? Very Good   BACK PAIN (STarT Back Screening Tool) No

## 2024-02-02 ENCOUNTER — Ambulatory Visit (INDEPENDENT_AMBULATORY_CARE_PROVIDER_SITE_OTHER): Payer: Medicare Other | Admitting: Physical Therapy

## 2024-02-02 ENCOUNTER — Encounter: Payer: Self-pay | Admitting: Physical Therapy

## 2024-02-02 DIAGNOSIS — M5459 Other low back pain: Secondary | ICD-10-CM

## 2024-02-02 DIAGNOSIS — R29898 Other symptoms and signs involving the musculoskeletal system: Secondary | ICD-10-CM | POA: Diagnosis not present

## 2024-02-02 DIAGNOSIS — M5416 Radiculopathy, lumbar region: Secondary | ICD-10-CM

## 2024-02-02 DIAGNOSIS — M6281 Muscle weakness (generalized): Secondary | ICD-10-CM

## 2024-02-02 NOTE — Therapy (Signed)
 OUTPATIENT PHYSICAL THERAPY TREATMENT   Patient Name: Allison Whitaker MRN: [id number] DOB:1941-01-14, 83 y.o., female Today's Date: 02/02/2024  END OF SESSION:  PT End of Session - 02/02/24 0929     Visit Number 6    Number of Visits 12    Date for PT Re-Evaluation 02/23/24    Authorization Type [employer] Medicare    Progress Note Due on Visit 10    PT Start Time 0930    PT Stop Time 1010    PT Time Calculation (min) 40 min    Activity Tolerance Patient tolerated treatment well    Behavior During Therapy WFL for tasks assessed/performed                Past Medical History:  Diagnosis Date   Anemia    DDD (degenerative disc disease), lumbosacral    GERD (gastroesophageal reflux disease)    Hiatal hernia    History of adenomatous polyp of colon    History of hypercalcemia    History of kidney stones    Hyperlipidemia    Hypertension    Hypothyroidism    followed by pcp   OA (osteoarthritis)    RA (rheumatoid arthritis) [facility])    rheumatologist--- dr Ebbie Goldmann; per pt dx 1990s  multiple sites,  treated w/ methotrexate    Right ureteral calculus    SUI (stress urinary incontinence, female)    Thyroid  nodule    single left nodule ;   bx 11-26-2012  benign   Wears contact lenses    Past Surgical History:  Procedure Laterality Date   COLONOSCOPY WITH PROPOFOL   02/03/2017   dr stark   CYSTOSCOPY W/ RETROGRADES  06/24/2023   Procedure: CYSTOSCOPY WITH RETROGRADE PYELOGRAM;  Surgeon: Mallie Seal, MD;  Location: [facility];  Service: Urology;;   CYSTOSCOPY W/ URETERAL STENT PLACEMENT Right 06/06/2023   Procedure: CYSTOSCOPY WITH RIGHT RETROGRADE PYELOGRAM/RIGHT URETERAL STENT PLACEMENT;  Surgeon: Mallie Seal, MD;  Location: [facility];  Service: Urology;  Laterality: Right;   CYSTOSCOPY/URETEROSCOPY/HOLMIUM LASER/STENT PLACEMENT Right 06/24/2023   Procedure: RIGHT URETEROSCOPY/HOLMIUM LASER/STENT EXCHANGE;  Surgeon: Mallie Seal, MD;  Location: [facility];  Service: Urology;  Laterality: Right;  60 MINUTES NEEDED FOR CASE   DORSAL COMPARTMENT RELEASE Right 10/06/2008   @ [facility] by dr Huntley Mai;  released first dorsal compartement w/ tenosynvectomy   OOPHORECTOMY  1983   per pt unilateral ,  unsure which side for dermoid cyst   TOTAL HIP ARTHROPLASTY Right 05/13/2022   Procedure: RIGHT TOTAL HIP ARTHROPLASTY ANTERIOR APPROACH;  Surgeon: Wes Hamman, MD;  Location: [facility];  Service: Orthopedics;  Laterality: Right;  3-C   WISDOM TOOTH EXTRACTION     age 40s   Patient Active Problem List   Diagnosis Date Noted   Spondylolisthesis of lumbar region 12/04/2023   Status post total replacement of right hip 05/13/2022   Primary osteoarthritis of right hip 05/12/2022   Age-related osteoporosis without current pathological fracture 10/22/2021   Body mass index (BMI) 33.0-33.9, adult 10/22/2021   Hypercalcemia 10/22/2021   Primary osteoarthritis 10/22/2021   Rheumatoid arthritis (HCC) 12/23/2004    PCP: Suan Elm, MD  REFERRING PROVIDER: Garry Kansas, MD  REFERRING DIAG: M43.16 (ICD-10-CM) - Spondylolisthesis of lumbar region  Rationale for Evaluation and Treatment: Rehabilitation  THERAPY DIAG:  Other low back pain  Muscle weakness (generalized)  Radiculopathy, lumbar region  Other symptoms and signs involving the musculoskeletal system  ONSET DATE: DOS: 12/04/23  SUBJECTIVE:                                                                                                                                                                                           SUBJECTIVE STATEMENT: Doing okay this morning, "not quite as good as I had been."   PERTINENT HISTORY:  DDD, hypercalcemia, HTN, hypothyroidism, OA, RA, R THA 2023  PAIN:  Are you having pain? Yes: NPRS scale: 5/10 Pain location: Rt low back radiating around to anterior thigh Pain description: aching Aggravating factors: standing,  walking Relieving factors: medication, sitting  PRECAUTIONS:  Back  RED FLAGS: None   WEIGHT BEARING RESTRICTIONS:  No  FALLS:  Has patient fallen in last 6 months? No  LIVING ENVIRONMENT: Lives with: lives with their spouse Lives in: House/apartment Stairs:  bedroom on first floor, 2 steps with no rail to enter Has following equipment at home: Single point cane  OCCUPATION:  Retired Chief Executive Officer  PLOF:  Independent and Leisure: read, crochet, puzzles, no regular exercise  PATIENT GOALS:  Improve pain and mobility   OBJECTIVE:  Note: Objective measures were completed at Evaluation unless otherwise noted.  PATIENT SURVEYS:  01/12/24 FOTO 58 (predicted 60)  COGNITIVE STATUS: Within functional limits for tasks assessed   SENSATION: WFL  POSTURE:  rounded shoulders, forward head, and increased lumbar lordosis  GAIT: 01/12/24 Comments: amb with SPC or no cane independently  PALPATION: 01/12/24 trigger points in glute med noted, pain at Rt SIJ  LOWER EXTREMITY ROM: Deferred due to post op status  LOWER EXTREMITY MMT:    MMT Right eval Left eval  Hip flexion 3+/5 with pain 4/5  Hip extension 2/5 2/5  Hip abduction 3/5 3/5  Hip adduction    Hip internal rotation    Hip external rotation    Knee flexion 3/5 3/5  Knee extension 4/5 4/5  Ankle dorsiflexion 5/5 5/5   (Blank rows = not tested)     TREATMENT:  DATE:  02/02/24 TherEx NuStep L6 x 8 min Mini crunch 2x10 with 3 sec hold Dead bug x 10 bil -cues for technique  TherAct (for functional tasks) Leg press 100# 3x10 bil; then single limb performed bil 50# 2x10  Neuro Re-Ed Forward step ups onto foam x 10 bil; intermittent UE support Sidestepping x 5 laps on foam; intermittent UE support SLS 2x10 bil on foam with light UE support needed  Manual STM with  percussive device for STM and trigger point release to Rt hip and glute  01/30/24 Pt seen for aquatic therapy today.  Treatment took place in water 3.5-4.75 ft in depth at the [facility] pool. Temp of water was 91.  Pt entered/exited the pool via steps with hand rail.  Pt requires the buoyancy and hydrostatic pressure of water for support, and to offload joints by unweighting joint load by at least 50 % in navel deep water and by at least 75-80% in chest to neck deep water.  Viscosity of the water is needed for resistance of strengthening. Water current perturbations provides challenge to standing balance requiring increased core activation.  Aquatic PT exercises Standing hip abduction/adduction X 20 bilat Standing hip flexion/extension X 20 bilat Marches X 15 bilat Forward walking 2 round trips horizontal width of pool Sidestepping 2 round trips horizontal width of pool Backwards  walking 2 round trips horizontal width of pool Monster walking 2 round trips Tandem walking 2 round trips Walking with hamstring curls 2 round trips Standing L stretch 5 sec X 10 holding flexion and holding extension Push/pull with kickboard X 15 each way, lunge stance Shoulder extensions with kickboard X 15 Water dumbell circles, stir the pot X 15 CW,CCW Lunge step to wall X 10 bilat Floating on pool noodle 5 min with cycling for spinal decompression  01/26/24 TherEx NuStep L6 x 8 min Rows L3 band 2x10; 5 sec hold Shoulder ext L3 ban 2x10; 3 sec hold Seated Rt hip flexor stretch 2x30 sec  TherAct (for functional tasks) Leg press 93# 2x10 bil; then single limb performed bil 50# 2x10 Modified plank from elevated surface with alternating hip extension x10 bil Squats 10# KB x10 reps each  Manual STM with percussive device for STM and trigger point release to Rt hip and glute; educated on purchase options  01/21/24 TherEx NuStep L6 x 8 min Single knee to chest 2x30 sec bil Supine piriformis  stretch 2x30 sec bil Pelvic tilts 10 x 5 sec hold Opp arm/leg isometric flexion x10 reps bil Single limb hooklying clamshell x15 reps bil; L3 band     PATIENT EDUCATION:  Education details: HEP, aquatics Person educated: Patient Education method: Programmer, multimedia, Facilities manager, and Handouts Education comprehension: verbalized understanding, returned demonstration, and needs further education  HOME EXERCISE PROGRAM: Access Code: [email or web address] URL: [email or web address] Date: 01/21/2024 Prepared by: Casimer Clear  Exercises - Hooklying Single Knee to Chest  - 2 x daily - 7 x weekly - 1 sets - 3 reps - 30 sec hold - Supine Piriformis Stretch with Foot on Ground  - 2 x daily - 7 x weekly - 1 sets - 3 reps - 30 sec hold - Supine Hamstring Stretch  - 2 x daily - 7 x weekly - 1 sets - 3 reps - 30 sec hold - Supine Posterior Pelvic Tilt  - 2 x daily - 7 x weekly - 1 sets - 10 reps - 5 sec hold - Hooklying Isometric Hip Flexion with Opposite Arm  - 2 x daily -  7 x weekly - 3 sets - 10 reps - Hooklying Single Leg Bent Knee Fallouts with Resistance  - 2 x daily - 7 x weekly - 1-2 sets - 10 reps  Access Code: [email or web address] URL: [email or web address] Date: 01/16/2024 Prepared by: Jamee Mazzoni  Exercises - Standing March at [facility]  - 1 x daily - 2 x weekly - 15 reps - Standing Hip Flexion Extension at [employer]  - 1 x daily - 2 x weekly - 15 reps - Standing Hip Abduction Adduction at Pool Wall  - 1 x daily - 2 x weekly - 20 reps - Forward Walking  - 1 x daily - 2 x weekly - 4 sets - Backward Walking  - 1 x daily - 2 x weekly - 4 sets - Side Stepping  - 2 x daily - 2 x weekly - 4 sets - 10 reps - Standing Knee Flexion  - 2 x daily - 6 x weekly - 3 sets - 10 reps - Squat  - 1 x daily - 2 x weekly - 15 reps - Lunge to Target at [employer]  - 1 x daily - 2 x weekly - 1 sets - 20 reps - Standing Single Leg Hip Circles  - 1 x daily - 2 x weekly - 1 sets - 20  reps   ASSESSMENT:  CLINICAL IMPRESSION: Pt still with continued pain at lateral hip into quad which continues to be intermittent.  Do feel she is making progress but did work on some balance activities needing some UE support at this time.  Continue skilled PT.   OBJECTIVE IMPAIRMENTS: decreased activity tolerance, decreased balance, decreased mobility, decreased strength, increased fascial restrictions, increased muscle spasms, postural dysfunction, and pain.   ACTIVITY LIMITATIONS: carrying, lifting, standing, squatting, sleeping, and locomotion level  PARTICIPATION LIMITATIONS: meal prep, cleaning, laundry, driving, shopping, and community activity  PERSONAL FACTORS: 3+ comorbidities: DDD, hypercalcemia, HTN, hypothyroidism, OA, RA, R THA 2023  are also affecting patient's functional outcome.   REHAB POTENTIAL: Good  CLINICAL DECISION MAKING: Evolving/moderate complexity  EVALUATION COMPLEXITY: Moderate   GOALS: Goals reviewed with patient? Yes  SHORT TERM GOALS: Target date: 02/02/2024  Independent with initial HEP Goal status: MET 01/21/24   LONG TERM GOALS: Target date: 02/23/2024   Independent with final HEP Goal status: INITIAL  2.  FOTO score improved to 60 Goal status: INITIAL  3.  Bil LE strength improved at least 1 muscle grade for improved mobility and function Goal status: INIITAL  4.  Report pain < 3/10 with standing and walking for improved function Goal status: INITIAL    PLAN:  PT FREQUENCY: 2x/week  PT DURATION: 6 weeks  PLANNED INTERVENTIONS: [id number]- PT Re-evaluation, 97110-Therapeutic exercises, 97530- Therapeutic activity, [id number]- Neuromuscular re-education, 97535- Self Care, 11914- Manual therapy, [id number]- Gait training, [phone]- Aquatic Therapy, 97014- Electrical stimulation (unattended), Patient/Family education, Balance training, Stair training, Taping, Dry Needling, Cryotherapy, and Moist heat.  PLAN FOR NEXT SESSION: continue core and hip  strengthening maintaining neutral core. Manual/modalities PRN  NEXT MD VISIT: PRN  Marley Simmers, PT, DPT 02/02/24 11:19 AM       Date of referral: 12/30/23 Referring provider: Garry Kansas, MD Referring diagnosis? M43.16 Treatment diagnosis? (if different than referring diagnosis) M54.59, M62.81, M54.16, R29.898  What was this (referring dx) caused by? Surgery (Type: lumbar fusion)  Nature of Condition: Initial Onset (within last 3 months)   Laterality: Rt  Current Functional Measure Score: FOTO 58  Objective measurements identify  impairments when they are compared to normal values, the uninvolved extremity, and prior level of function.  [x]  Yes  []  No  Objective assessment of functional ability: Moderate functional limitations   Briefly describe symptoms: Rt side low back pain, radiates to Rt anterior thigh  How did symptoms start: spontaneous  Average pain intensity:  Last 24 hours: 5  Past week: 5  How often does the pt experience symptoms? Constantly  How much have the symptoms interfered with usual daily activities? Moderately  How has condition changed since care began at this facility? NA - initial visit  In general, how is the patients overall health? Very Good   BACK PAIN (STarT Back Screening Tool) No

## 2024-02-06 ENCOUNTER — Encounter: Payer: Self-pay | Admitting: Physical Therapy

## 2024-02-06 ENCOUNTER — Ambulatory Visit: Payer: Medicare Other | Admitting: Physical Therapy

## 2024-02-06 DIAGNOSIS — R29898 Other symptoms and signs involving the musculoskeletal system: Secondary | ICD-10-CM

## 2024-02-06 DIAGNOSIS — M5459 Other low back pain: Secondary | ICD-10-CM | POA: Diagnosis not present

## 2024-02-06 DIAGNOSIS — M6281 Muscle weakness (generalized): Secondary | ICD-10-CM

## 2024-02-06 DIAGNOSIS — M5416 Radiculopathy, lumbar region: Secondary | ICD-10-CM | POA: Diagnosis not present

## 2024-02-06 NOTE — Therapy (Addendum)
 OUTPATIENT PHYSICAL THERAPY TREATMENT/discharge addendum PHYSICAL THERAPY DISCHARGE SUMMARY  Visits from Start of Care: 7  Current functional level related to goals / functional outcomes: See below   Remaining deficits: See below   Education / Equipment: HEP  Plan:  Patient goals were some met. Patient is being discharged due to not returning since last visit >30 days   Jamee Mazzoni, PT, DPT 04/05/24 3:15 PM    Patient Name: Allison Whitaker MRN: [id number] DOB:June 16, 1941, 83 y.o., female Today's Date: 02/06/2024  END OF SESSION:  PT End of Session - 02/06/24 0946     Visit Number 7    Number of Visits 12    Date for PT Re-Evaluation 02/23/24    Authorization Type [employer] Medicare    Progress Note Due on Visit 10    PT Start Time 0930    PT Stop Time 1010    PT Time Calculation (min) 40 min    Activity Tolerance Patient tolerated treatment well    Behavior During Therapy WFL for tasks assessed/performed                Past Medical History:  Diagnosis Date   Anemia    DDD (degenerative disc disease), lumbosacral    GERD (gastroesophageal reflux disease)    Hiatal hernia    History of adenomatous polyp of colon    History of hypercalcemia    History of kidney stones    Hyperlipidemia    Hypertension    Hypothyroidism    followed by pcp   OA (osteoarthritis)    RA (rheumatoid arthritis) [facility]    rheumatologist--- dr Ebbie Goldmann; per pt dx 1990s  multiple sites,  treated w/ methotrexate   Right ureteral calculus    SUI (stress urinary incontinence, female)    Thyroid nodule    single left nodule ;   bx 11-26-2012  benign   Wears contact lenses    Past Surgical History:  Procedure Laterality Date   COLONOSCOPY WITH PROPOFOL  02/03/2017   dr stark   CYSTOSCOPY W/ RETROGRADES  06/24/2023   Procedure: CYSTOSCOPY WITH RETROGRADE PYELOGRAM;  Surgeon: Mallie Seal, MD;  Location: [facility];  Service: Urology;;   CYSTOSCOPY W/ URETERAL STENT  PLACEMENT Right 06/06/2023   Procedure: CYSTOSCOPY WITH RIGHT RETROGRADE PYELOGRAM/RIGHT URETERAL STENT PLACEMENT;  Surgeon: Mallie Seal, MD;  Location: [facility];  Service: Urology;  Laterality: Right;   CYSTOSCOPY/URETEROSCOPY/HOLMIUM LASER/STENT PLACEMENT Right 06/24/2023   Procedure: RIGHT URETEROSCOPY/HOLMIUM LASER/STENT EXCHANGE;  Surgeon: Mallie Seal, MD;  Location: [facility];  Service: Urology;  Laterality: Right;  60 MINUTES NEEDED FOR CASE   DORSAL COMPARTMENT RELEASE Right 10/06/2008   @ [facility] by dr Huntley Mai;  released first dorsal compartement w/ tenosynvectomy   OOPHORECTOMY  1983   per pt unilateral ,  unsure which side for dermoid cyst   TOTAL HIP ARTHROPLASTY Right 05/13/2022   Procedure: RIGHT TOTAL HIP ARTHROPLASTY ANTERIOR APPROACH;  Surgeon: Wes Hamman, MD;  Location: [facility];  Service: Orthopedics;  Laterality: Right;  3-C   WISDOM TOOTH EXTRACTION     age 37s   Patient Active Problem List   Diagnosis Date Noted   Spondylolisthesis of lumbar region 12/04/2023   Status post total replacement of right hip 05/13/2022   Primary osteoarthritis of right hip 05/12/2022   Age-related osteoporosis without current pathological fracture 10/22/2021   Body mass index (BMI) 33.0-33.9, adult 10/22/2021   Hypercalcemia 10/22/2021   Primary osteoarthritis 10/22/2021  Rheumatoid arthritis (HCC) 12/23/2004    PCP: Suan Elm, MD  REFERRING PROVIDER: Garry Kansas, MD  REFERRING DIAG: [phone] (ICD-10-CM) - Spondylolisthesis of lumbar region  Rationale for Evaluation and Treatment: Rehabilitation  THERAPY DIAG:  Other low back pain  Muscle weakness (generalized)  Radiculopathy, lumbar region  Other symptoms and signs involving the musculoskeletal system  ONSET DATE: DOS: 12/04/23   SUBJECTIVE:                                                                                                                                                                                            SUBJECTIVE STATEMENT: Relays feeling about the same. She has joined [employer] and may finish up with PT next week to transition to independent program.   PERTINENT HISTORY:  DDD, hypercalcemia, HTN, hypothyroidism, OA, RA, R THA 2023  PAIN:  Are you having pain? Yes: NPRS scale: 4-5/10 Pain location: Rt low back radiating around to anterior thigh Pain description: aching Aggravating factors: standing, walking Relieving factors: medication, sitting  PRECAUTIONS:  Back  RED FLAGS: None   WEIGHT BEARING RESTRICTIONS:  No  FALLS:  Has patient fallen in last 6 months? No  LIVING ENVIRONMENT: Lives with: lives with their spouse Lives in: House/apartment Stairs:  bedroom on first floor, 2 steps with no rail to enter Has following equipment at home: Single point cane  OCCUPATION:  Retired Chief Executive Officer  PLOF:  Independent and Leisure: read, crochet, puzzles, no regular exercise  PATIENT GOALS:  Improve pain and mobility   OBJECTIVE:  Note: Objective measures were completed at Evaluation unless otherwise noted.  PATIENT SURVEYS:  01/12/24 FOTO 58 (predicted 60)  COGNITIVE STATUS: Within functional limits for tasks assessed   SENSATION: WFL  POSTURE:  rounded shoulders, forward head, and increased lumbar lordosis  GAIT: 01/12/24 Comments: amb with SPC or no cane independently  PALPATION: 01/12/24 trigger points in glute med noted, pain at Rt SIJ  LOWER EXTREMITY ROM: Deferred due to post op status  LOWER EXTREMITY MMT:    MMT Right eval Left eval  Hip flexion 3+/5 with pain 4/5  Hip extension 2/5 2/5  Hip abduction 3/5 3/5  Hip adduction    Hip internal rotation    Hip external rotation    Knee flexion 3/5 3/5  Knee extension 4/5 4/5  Ankle dorsiflexion 5/5 5/5   (Blank rows = not tested)     TREATMENT:  DATE:  02/06/24 Pt seen for aquatic therapy today.  Treatment took place in water 3.5-4.75 ft in depth at the [facility] pool. Temp of water was 91.  Pt entered/exited the pool via steps with hand rail.  Pt requires the buoyancy and hydrostatic pressure of water for support, and to offload joints by unweighting joint load by at least 50 % in navel deep water and by at least 75-80% in chest to neck deep water.  Viscosity of the water is needed for resistance of strengthening. Water current perturbations provides challenge to standing balance requiring increased core activation.  Aquatic PT exercises Standing hip abduction/adduction X 20 bilat Standing hip flexion/extension X 20 bilat Marches X 15 bilat Forward walking 3 round trips horizontal width of pool Sidestepping 3 round trips horizontal width of pool Backwards  walking 3 round trips horizontal width of pool Monster walking 3 round trips Tandem walking 3 round trips Walking with hamstring curls 2 round trips Standing L stretch 5 sec X 10 holding flexion and holding extension Push/pull with kickboard X 15 each way, lunge stance Shoulder extensions with kickboard X 15 Water dumbell circles, stir the pot X 15 CW,CCW Lunge step to wall X 10 bilat Floating on pool noodle 5 min with cycling for spinal decompression  02/02/24 TherEx NuStep L6 x 8 min Mini crunch 2x10 with 3 sec hold Dead bug x 10 bil -cues for technique  TherAct (for functional tasks) Leg press 100# 3x10 bil; then single limb performed bil 50# 2x10  Neuro Re-Ed Forward step ups onto foam x 10 bil; intermittent UE support Sidestepping x 5 laps on foam; intermittent UE support SLS 2x10 bil on foam with light UE support needed  Manual STM with percussive device for STM and trigger point release to Rt hip and glute   PATIENT EDUCATION:  Education details: HEP, aquatics Person educated: Patient Education method: Programmer, multimedia,  Facilities manager, and Handouts Education comprehension: verbalized understanding, returned demonstration, and needs further education  HOME EXERCISE PROGRAM: Access Code: [email or web address] URL: [email or web address] Date: 01/21/2024 Prepared by: Casimer Clear  Exercises - Hooklying Single Knee to Chest  - 2 x daily - 7 x weekly - 1 sets - 3 reps - 30 sec hold - Supine Piriformis Stretch with Foot on Ground  - 2 x daily - 7 x weekly - 1 sets - 3 reps - 30 sec hold - Supine Hamstring Stretch  - 2 x daily - 7 x weekly - 1 sets - 3 reps - 30 sec hold - Supine Posterior Pelvic Tilt  - 2 x daily - 7 x weekly - 1 sets - 10 reps - 5 sec hold - Hooklying Isometric Hip Flexion with Opposite Arm  - 2 x daily - 7 x weekly - 3 sets - 10 reps - Hooklying Single Leg Bent Knee Fallouts with Resistance  - 2 x daily - 7 x weekly - 1-2 sets - 10 reps  Access Code: [email or web address] URL: [email or web address] Date: 01/16/2024 Prepared by: Jamee Mazzoni  Exercises - Standing March at [facility]  - 1 x daily - 2 x weekly - 15 reps - Standing Hip Flexion Extension at [employer]  - 1 x daily - 2 x weekly - 15 reps - Standing Hip Abduction Adduction at Pool Wall  - 1 x daily - 2 x weekly - 20 reps - Forward Walking  - 1 x daily - 2 x weekly - 4 sets - Backward Walking  - 1 x daily - 2 x  weekly - 4 sets - Side Stepping  - 2 x daily - 2 x weekly - 4 sets - 10 reps - Standing Knee Flexion  - 2 x daily - 6 x weekly - 3 sets - 10 reps - Squat  - 1 x daily - 2 x weekly - 15 reps - Lunge to Target at [employer]  - 1 x daily - 2 x weekly - 1 sets - 20 reps - Standing Single Leg Hip Circles  - 1 x daily - 2 x weekly - 1 sets - 20 reps   ASSESSMENT:  CLINICAL IMPRESSION: She has now joined the [facility] for the pool, exercise equipement and classes. She mentions she may plan to finish up with PT next week to try out independent program. I did inform her we can keep her PT plan open for 30 days if she feels she  needs to return. We will update measurements and goals next visit for likely transition to HEP.   OBJECTIVE IMPAIRMENTS: decreased activity tolerance, decreased balance, decreased mobility, decreased strength, increased fascial restrictions, increased muscle spasms, postural dysfunction, and pain.   ACTIVITY LIMITATIONS: carrying, lifting, standing, squatting, sleeping, and locomotion level  PARTICIPATION LIMITATIONS: meal prep, cleaning, laundry, driving, shopping, and community activity  PERSONAL FACTORS: 3+ comorbidities: DDD, hypercalcemia, HTN, hypothyroidism, OA, RA, R THA 2023  are also affecting patient's functional outcome.   REHAB POTENTIAL: Good  CLINICAL DECISION MAKING: Evolving/moderate complexity  EVALUATION COMPLEXITY: Moderate   GOALS: Goals reviewed with patient? Yes  SHORT TERM GOALS: Target date: 02/02/2024  Independent with initial HEP Goal status: MET 01/21/24   LONG TERM GOALS: Target date: 02/23/2024   Independent with final HEP Goal status: INITIAL  2.  FOTO score improved to 60 Goal status: INITIAL  3.  Bil LE strength improved at least 1 muscle grade for improved mobility and function Goal status: INIITAL  4.  Report pain < 3/10 with standing and walking for improved function Goal status: INITIAL    PLAN:  PT FREQUENCY: 2x/week  PT DURATION: 6 weeks  PLANNED INTERVENTIONS: 97164- PT Re-evaluation, 97110-Therapeutic exercises, 97530- Therapeutic activity, [id number]- Neuromuscular re-education, 97535- Self Care, 62130- Manual therapy, [id number]- Gait training, [id number]- Aquatic Therapy, 97014- Electrical stimulation (unattended), Patient/Family education, Balance training, Stair training, Taping, Dry Needling, Cryotherapy, and Moist heat.  PLAN FOR NEXT SESSION: may finish up with PT next week to transition to independent program.  NEXT MD VISIT: PRN  Jamee Mazzoni, PT, DPT 02/06/24 10:09 AM        Date of referral: 12/30/23 Referring provider:  Garry Kansas, MD Referring diagnosis? M43.16 Treatment diagnosis? (if different than referring diagnosis) M54.59, M62.81, M54.16, R29.898  What was this (referring dx) caused by? Surgery (Type: lumbar fusion)  Nature of Condition: Initial Onset (within last 3 months)   Laterality: Rt  Current Functional Measure Score: FOTO 58  Objective measurements identify impairments when they are compared to normal values, the uninvolved extremity, and prior level of function.  [x]  Yes  []  No  Objective assessment of functional ability: Moderate functional limitations   Briefly describe symptoms: Rt side low back pain, radiates to Rt anterior thigh  How did symptoms start: spontaneous  Average pain intensity:  Last 24 hours: 5  Past week: 5  How often does the pt experience symptoms? Constantly  How much have the symptoms interfered with usual daily activities? Moderately  How has condition changed since care began at this facility? NA - initial visit  In general, how  is the patients overall health? Very Good   BACK PAIN (STarT Back Screening Tool) No

## 2024-02-09 ENCOUNTER — Encounter: Payer: Medicare Other | Admitting: Physical Therapy

## 2024-02-11 ENCOUNTER — Encounter: Payer: Medicare Other | Admitting: Physical Therapy

## 2024-05-12 ENCOUNTER — Ambulatory Visit: Payer: Medicare Other | Admitting: Orthopaedic Surgery

## 2024-05-12 ENCOUNTER — Other Ambulatory Visit (INDEPENDENT_AMBULATORY_CARE_PROVIDER_SITE_OTHER)

## 2024-05-12 DIAGNOSIS — Z96641 Presence of right artificial hip joint: Secondary | ICD-10-CM

## 2024-05-12 NOTE — Progress Notes (Signed)
 Office Visit Note   Patient: Allison Whitaker           Date of Birth: [birth year]           MRN: [id number] Visit Date: 05/12/2024              Requested by: Suan Elm, MD [address],  [place] [address] PCP: Suan Elm, MD   Assessment & Plan: Visit Diagnoses:  1. Status post total replacement of right hip     Plan: History of Present Illness Allison Whitaker is an 83 year old female with a history of back surgery who presents with persistent thigh pain and numbness.  She underwent back surgery in December 2024 by Dr. Larrie Po, she experiences persistent pain and numbness on the side of her thigh, sometimes accompanied by a burning sensation. These symptoms have been present since before the surgery and have persisted. Pain and numbness worsen with prolonged standing or walking, limiting mobility. She has attempted home physical therapy exercises and used oxycodone  post-surgery without relief. She is considering restarting gabapentin , previously taken for another issue, to address current symptoms. She inquires about a cortisone shot for relief. Tylenol  and oxycodone  have been ineffective. No hip or groin pain is reported.  Physical Exam MUSCULOSKELETAL: Hip moves smoothly with no pain on palpation.  SKIN: Surgical scar appears well-healed.  Results RADIOLOGY Hip implant evaluation: Implant with excellent bony ingrowth, no evidence of loosening.  Assessment and Plan Post-surgical back pain Persistent back pain with thigh numbness and burning post-surgery. Numbness may be permanent. Cortisone injections not indicated.  Nerve-related thigh pain Chronic nerve-related thigh pain with burning sensation likely due to pre-existing nerve damage. Gabapentin  previously discontinued due to perceived lack of efficacy. Discussed potential side effects of gabapentin . - Restart gabapentin  at 100 mg daily and titrate to 200-300 mg as needed.  In regards to her hip  replacement.  She is 2 years postop and has done very well.  She is very pleased wthe outcome.  She may discontinue dental prophylaxis.  From my standpoint we can see her back as needed.  Follow-Up Instructions: Return if symptoms worsen or fail to improve.   Orders:  Orders Placed This Encounter  Procedures   XR HIP UNILAT W OR W/O PELVIS 2-3 VIEWS RIGHT   No orders of the defined types were placed in this encounter.     Subjective: Chief Complaint  Patient presents with   Right Hip - Follow-up    HPI  Review of Systems  Constitutional: Negative.   HENT: Negative.    Eyes: Negative.   Respiratory: Negative.    Cardiovascular: Negative.   Endocrine: Negative.   Musculoskeletal: Negative.   Neurological: Negative.   Hematological: Negative.   Psychiatric/Behavioral: Negative.    All other systems reviewed and are negative.    Objective: Vital Signs: There were no vitals taken for this visit.  Physical Exam Vitals and nursing note reviewed.  Constitutional:      Appearance: She is well-developed.  HENT:     Head: Normocephalic and atraumatic.  Pulmonary:     Effort: Pulmonary effort is normal.  Abdominal:     Palpations: Abdomen is soft.  Musculoskeletal:     Cervical back: Neck supple.  Skin:    General: Skin is warm.     Capillary Refill: Capillary refill takes less than 2 seconds.  Neurological:     Mental Status: She is alert and oriented to person, place, and time.  Psychiatric:        Behavior: Behavior normal.        Thought Content: Thought content normal.        Judgment: Judgment normal.     Ortho Exam  Specialty Comments:  Narrative & Impression CLINICAL DATA:  Low back pain.  No previous lumbar surgery.   EXAM: LUMBAR MYELOGRAM   CT LUMBAR SPINE WITH INTRATHECAL CONTRAST   FLUOROSCOPY TIME:  41 seconds; 330 uGym2 DAP   TECHNIQUE: The procedure, risks (including but not limited to bleeding, infection, organ damage ), benefits,  and alternatives were explained to the patient. Questions regarding the procedure were encouraged and answered. The patient understands and consents to the procedure. An appropriate entry site was determined under fluoroscopy. Operator donned sterile gloves and mask. Skin site was marked, prepped with Betadine , and draped in usual sterile fashion, and infiltrated locally with 1% lidocaine . A 22 gauge spinal needle was advanced into the thecal sac at L3 from a left parasagittal approach. Clear colorless CSF returned. 17 ml Omnipaque  180 were administered intrathecally for lumbar myelography, followed by axial CT scanning of the lumbar spine. I personally performed the lumbar puncture and administered the intrathecal contrast. I also personally supervised acquisition of the myelogram images. Coronal and sagittal reconstructions were generated from the axial scan.   COMPARISON:  None   FINDINGS: 5 non rib-bearing lumbar segments labeled L1-L5. Benign vertebral hemangiomas in the L1, L2, and L4 vertebral bodies. Negative for fracture.   T12-L1: Interspace unremarkable. Central canal and foramina patent.   L1-2: Conus terminates at the interspace. Mild circumferential calcified disc bulge. Mild right lateral recess encroachment. No significant spinal or foraminal stenosis.   L2-3: Mild narrowing of the interspace on the right. Moderate calcified posterior disc bulge right worse than left. There is mild facet DJD left worse than right and some thickening of the ligamentum flavum contributing to moderate central canal stenosis, right foraminal stenosis, and bilateral lateral recess stenosis.   L3-4: Calcified circumferential disc bulge. 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   L4-5: Advanced bilateral facet DJD with grade 1  anterolisthesis. Mild posterior calcified disc bulge contributes to severe multifactorial spinal stenosis and bilateral foraminal encroachment.   L5-S1: Small left paracentral partially calcified protrusion approaches the left S1 nerve root. No spinal or foraminal stenosis.   Moderate calcified aortoiliac atherosclerosis (ICD10-170.0). 7 mm lower pole left renal calculus without hydronephrosis. Remainder visualized paraspinal soft tissues unremarkable.   IMPRESSION: 1. Mild disc bulge L1-2 with right lateral recess encroachment. 2. 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 3. Mild multifactorial spinal stenosis L3-4 with early foraminal encroachment bilaterally. 4. Severe multifactorial spinal stenosis and grade 1 anterolisthesis L4-5 with bilateral foraminal encroachment. 5. Left paracentral protrusion L5-S1 approaches the left S1 nerve root without compression. 6. Nonobstructive left nephrolithiasis.     Electronically Signed   By: Nicoletta Barrier M.D.   On: 07/13/2021 14:27  Imaging: XR HIP UNILAT W OR W/O PELVIS 2-3 VIEWS RIGHT Result Date: 05/12/2024 X-rays of the right hip show a stable right total hip arthroplasty without any complications.    PMFS History: Patient Active Problem List   Diagnosis Date Noted   Spondylolisthesis of lumbar region 12/04/2023   Status post total replacement of right hip 05/13/2022   Primary osteoarthritis of right hip 05/12/2022   Age-related osteoporosis without current  pathological fracture 10/22/2021   Body mass index (BMI) 33.0-33.9, adult 10/22/2021   Hypercalcemia 10/22/2021   Primary osteoarthritis 10/22/2021   Rheumatoid arthritis (HCC) 12/23/2004   Past Medical History:  Diagnosis Date   Anemia    DDD (degenerative disc disease), lumbosacral    GERD (gastroesophageal reflux disease)    Hiatal hernia    History of adenomatous polyp of colon    History of  hypercalcemia    History of kidney stones    Hyperlipidemia    Hypertension    Hypothyroidism    followed by pcp   OA (osteoarthritis)    RA (rheumatoid arthritis) [facility]    rheumatologist--- dr Ebbie Goldmann; per pt dx 1990s  multiple sites,  treated w/ methotrexate    Right ureteral calculus    SUI (stress urinary incontinence, female)    Thyroid  nodule    single left nodule ;   bx 11-26-2012  benign   Wears contact lenses     Family History  Problem Relation Age of Onset   Stroke Mother    Colon cancer Neg Hx     Past Surgical History:  Procedure Laterality Date   COLONOSCOPY WITH PROPOFOL   02/03/2017   dr stark   CYSTOSCOPY W/ RETROGRADES  06/24/2023   Procedure: CYSTOSCOPY WITH RETROGRADE PYELOGRAM;  Surgeon: Mallie Seal, MD;  Location: [facility] [place];  Service: Urology;;   CYSTOSCOPY W/ URETERAL STENT PLACEMENT Right 06/06/2023   Procedure: CYSTOSCOPY WITH RIGHT RETROGRADE PYELOGRAM/RIGHT URETERAL STENT PLACEMENT;  Surgeon: Mallie Seal, MD;  Location: [facility];  Service: Urology;  Laterality: Right;   CYSTOSCOPY/URETEROSCOPY/HOLMIUM LASER/STENT PLACEMENT Right 06/24/2023   Procedure: RIGHT URETEROSCOPY/HOLMIUM LASER/STENT EXCHANGE;  Surgeon: Mallie Seal, MD;  Location: [facility];  Service: Urology;  Laterality: Right;  60 MINUTES NEEDED FOR CASE   DORSAL COMPARTMENT RELEASE Right 10/06/2008   @ [facility] by dr Huntley Mai;  released first dorsal compartement w/ tenosynvectomy   OOPHORECTOMY  1983   per pt unilateral ,  unsure which side for dermoid cyst   TOTAL HIP ARTHROPLASTY Right 05/13/2022   Procedure: RIGHT TOTAL HIP ARTHROPLASTY ANTERIOR APPROACH;  Surgeon: Wes Hamman, MD;  Location: [facility];  Service: Orthopedics;  Laterality: Right;  3-C   WISDOM TOOTH EXTRACTION     age 72s   Social History   Occupational History   Not on file  Tobacco Use   Smoking status: Never   Smokeless tobacco: Never  Vaping Use   Vaping status: Never Used   Substance and Sexual Activity   Alcohol use: Yes    Comment: occasional   Drug use: Never   Sexual activity: Not on file

## 2024-10-05 ENCOUNTER — Ambulatory Visit

## 2024-10-05 DIAGNOSIS — Z23 Encounter for immunization: Secondary | ICD-10-CM

## 2024-10-25 ENCOUNTER — Encounter: Payer: Self-pay | Admitting: Radiology
